# Patient Record
Sex: Female | Born: 1935 | Race: White | Smoking: Former smoker
Health system: Southern US, Community
[De-identification: ages and names within clinical notes are randomized; demographics above are authoritative.]

## PROBLEM LIST (undated history)

## (undated) DIAGNOSIS — C50919 Malignant neoplasm of unspecified site of unspecified female breast: Secondary | ICD-10-CM

## (undated) DIAGNOSIS — I2510 Atherosclerotic heart disease of native coronary artery without angina pectoris: Secondary | ICD-10-CM

## (undated) DIAGNOSIS — E119 Type 2 diabetes mellitus without complications: Secondary | ICD-10-CM

## (undated) DIAGNOSIS — I10 Essential (primary) hypertension: Secondary | ICD-10-CM

## (undated) HISTORY — PX: CHOLECYSTECTOMY: SHX55

## (undated) HISTORY — PX: OTHER SURGICAL HISTORY: SHX169

## (undated) HISTORY — PX: EYE SURGERY: SHX253

## (undated) HISTORY — PX: CARDIAC SURGERY: SHX584

## (undated) HISTORY — PX: BREAST LUMPECTOMY: SHX2

---

## 2010-12-03 ENCOUNTER — Ambulatory Visit (HOSPITAL_COMMUNITY)
Admission: RE | Admit: 2010-12-03 | Discharge: 2010-12-03 | Disposition: A | Discharge status: Home / Self Care | Payer: Medicare Other | Source: Ambulatory Visit | Attending: Family Medicine | Admitting: Family Medicine

## 2010-12-03 ENCOUNTER — Other Ambulatory Visit (HOSPITAL_COMMUNITY): Payer: Self-pay | Admitting: Family Medicine

## 2010-12-03 DIAGNOSIS — M545 Low back pain, unspecified: Secondary | ICD-10-CM

## 2010-12-03 DIAGNOSIS — M51379 Other intervertebral disc degeneration, lumbosacral region without mention of lumbar back pain or lower extremity pain: Secondary | ICD-10-CM | POA: Insufficient documentation

## 2010-12-03 DIAGNOSIS — M5137 Other intervertebral disc degeneration, lumbosacral region: Secondary | ICD-10-CM | POA: Insufficient documentation

## 2013-08-31 DIAGNOSIS — C50919 Malignant neoplasm of unspecified site of unspecified female breast: Secondary | ICD-10-CM

## 2013-08-31 HISTORY — DX: Malignant neoplasm of unspecified site of unspecified female breast: C50.919

## 2016-06-01 ENCOUNTER — Inpatient Hospital Stay (HOSPITAL_COMMUNITY)
Admission: EM | Admit: 2016-06-01 | Discharge: 2016-06-11 | DRG: 463 | Disposition: A | Discharge status: Skilled Nursing Facility | Payer: Medicare Other | Attending: Internal Medicine | Admitting: Internal Medicine

## 2016-06-01 ENCOUNTER — Emergency Department (HOSPITAL_COMMUNITY): Payer: Medicare Other

## 2016-06-01 ENCOUNTER — Encounter (HOSPITAL_COMMUNITY): Payer: Self-pay | Admitting: Emergency Medicine

## 2016-06-01 DIAGNOSIS — D500 Iron deficiency anemia secondary to blood loss (chronic): Secondary | ICD-10-CM | POA: Diagnosis present

## 2016-06-01 DIAGNOSIS — R8271 Bacteriuria: Secondary | ICD-10-CM | POA: Diagnosis not present

## 2016-06-01 DIAGNOSIS — T8454XA Infection and inflammatory reaction due to internal left knee prosthesis, initial encounter: Principal | ICD-10-CM | POA: Diagnosis present

## 2016-06-01 DIAGNOSIS — Z881 Allergy status to other antibiotic agents status: Secondary | ICD-10-CM

## 2016-06-01 DIAGNOSIS — Z7901 Long term (current) use of anticoagulants: Secondary | ICD-10-CM

## 2016-06-01 DIAGNOSIS — M009 Pyogenic arthritis, unspecified: Secondary | ICD-10-CM | POA: Diagnosis present

## 2016-06-01 DIAGNOSIS — M00062 Staphylococcal arthritis, left knee: Secondary | ICD-10-CM | POA: Diagnosis not present

## 2016-06-01 DIAGNOSIS — Z880 Allergy status to penicillin: Secondary | ICD-10-CM | POA: Diagnosis not present

## 2016-06-01 DIAGNOSIS — I429 Cardiomyopathy, unspecified: Secondary | ICD-10-CM | POA: Diagnosis present

## 2016-06-01 DIAGNOSIS — Z79899 Other long term (current) drug therapy: Secondary | ICD-10-CM | POA: Diagnosis not present

## 2016-06-01 DIAGNOSIS — N179 Acute kidney failure, unspecified: Secondary | ICD-10-CM | POA: Diagnosis present

## 2016-06-01 DIAGNOSIS — I10 Essential (primary) hypertension: Secondary | ICD-10-CM | POA: Diagnosis present

## 2016-06-01 DIAGNOSIS — Z09 Encounter for follow-up examination after completed treatment for conditions other than malignant neoplasm: Secondary | ICD-10-CM

## 2016-06-01 DIAGNOSIS — I340 Nonrheumatic mitral (valve) insufficiency: Secondary | ICD-10-CM | POA: Diagnosis not present

## 2016-06-01 DIAGNOSIS — I5043 Acute on chronic combined systolic (congestive) and diastolic (congestive) heart failure: Secondary | ICD-10-CM | POA: Diagnosis present

## 2016-06-01 DIAGNOSIS — T8454XD Infection and inflammatory reaction due to internal left knee prosthesis, subsequent encounter: Secondary | ICD-10-CM | POA: Diagnosis not present

## 2016-06-01 DIAGNOSIS — R52 Pain, unspecified: Secondary | ICD-10-CM | POA: Diagnosis present

## 2016-06-01 DIAGNOSIS — A419 Sepsis, unspecified organism: Secondary | ICD-10-CM | POA: Diagnosis present

## 2016-06-01 DIAGNOSIS — I2510 Atherosclerotic heart disease of native coronary artery without angina pectoris: Secondary | ICD-10-CM | POA: Diagnosis present

## 2016-06-01 DIAGNOSIS — R739 Hyperglycemia, unspecified: Secondary | ICD-10-CM

## 2016-06-01 DIAGNOSIS — N390 Urinary tract infection, site not specified: Secondary | ICD-10-CM | POA: Diagnosis present

## 2016-06-01 DIAGNOSIS — Z8249 Family history of ischemic heart disease and other diseases of the circulatory system: Secondary | ICD-10-CM

## 2016-06-01 DIAGNOSIS — R262 Difficulty in walking, not elsewhere classified: Secondary | ICD-10-CM

## 2016-06-01 DIAGNOSIS — N61 Mastitis without abscess: Secondary | ICD-10-CM | POA: Diagnosis present

## 2016-06-01 DIAGNOSIS — E872 Acidosis, unspecified: Secondary | ICD-10-CM | POA: Diagnosis present

## 2016-06-01 DIAGNOSIS — I081 Rheumatic disorders of both mitral and tricuspid valves: Secondary | ICD-10-CM | POA: Diagnosis present

## 2016-06-01 DIAGNOSIS — E1165 Type 2 diabetes mellitus with hyperglycemia: Secondary | ICD-10-CM | POA: Diagnosis present

## 2016-06-01 DIAGNOSIS — Z7984 Long term (current) use of oral hypoglycemic drugs: Secondary | ICD-10-CM | POA: Diagnosis not present

## 2016-06-01 DIAGNOSIS — A491 Streptococcal infection, unspecified site: Secondary | ICD-10-CM | POA: Diagnosis not present

## 2016-06-01 DIAGNOSIS — T8450XD Infection and inflammatory reaction due to unspecified internal joint prosthesis, subsequent encounter: Secondary | ICD-10-CM

## 2016-06-01 DIAGNOSIS — J811 Chronic pulmonary edema: Secondary | ICD-10-CM

## 2016-06-01 DIAGNOSIS — I110 Hypertensive heart disease with heart failure: Secondary | ICD-10-CM | POA: Diagnosis present

## 2016-06-01 DIAGNOSIS — E876 Hypokalemia: Secondary | ICD-10-CM | POA: Diagnosis present

## 2016-06-01 DIAGNOSIS — Z853 Personal history of malignant neoplasm of breast: Secondary | ICD-10-CM

## 2016-06-01 DIAGNOSIS — M00262 Other streptococcal arthritis, left knee: Secondary | ICD-10-CM | POA: Diagnosis not present

## 2016-06-01 DIAGNOSIS — R7881 Bacteremia: Secondary | ICD-10-CM | POA: Diagnosis not present

## 2016-06-01 DIAGNOSIS — I480 Paroxysmal atrial fibrillation: Secondary | ICD-10-CM | POA: Diagnosis present

## 2016-06-01 DIAGNOSIS — Z825 Family history of asthma and other chronic lower respiratory diseases: Secondary | ICD-10-CM | POA: Diagnosis not present

## 2016-06-01 DIAGNOSIS — B951 Streptococcus, group B, as the cause of diseases classified elsewhere: Secondary | ICD-10-CM | POA: Diagnosis not present

## 2016-06-01 DIAGNOSIS — Z9111 Patient's noncompliance with dietary regimen: Secondary | ICD-10-CM

## 2016-06-01 DIAGNOSIS — R062 Wheezing: Secondary | ICD-10-CM

## 2016-06-01 DIAGNOSIS — W1830XA Fall on same level, unspecified, initial encounter: Secondary | ICD-10-CM | POA: Diagnosis present

## 2016-06-01 DIAGNOSIS — A401 Sepsis due to streptococcus, group B: Secondary | ICD-10-CM

## 2016-06-01 DIAGNOSIS — Y831 Surgical operation with implant of artificial internal device as the cause of abnormal reaction of the patient, or of later complication, without mention of misadventure at the time of the procedure: Secondary | ICD-10-CM | POA: Diagnosis present

## 2016-06-01 DIAGNOSIS — IMO0002 Reserved for concepts with insufficient information to code with codable children: Secondary | ICD-10-CM | POA: Diagnosis present

## 2016-06-01 DIAGNOSIS — R000 Tachycardia, unspecified: Secondary | ICD-10-CM | POA: Diagnosis present

## 2016-06-01 DIAGNOSIS — Z923 Personal history of irradiation: Secondary | ICD-10-CM

## 2016-06-01 DIAGNOSIS — T8459XA Infection and inflammatory reaction due to other internal joint prosthesis, initial encounter: Secondary | ICD-10-CM

## 2016-06-01 DIAGNOSIS — Z22330 Carrier of Group B streptococcus: Secondary | ICD-10-CM

## 2016-06-01 DIAGNOSIS — I4510 Unspecified right bundle-branch block: Secondary | ICD-10-CM | POA: Diagnosis present

## 2016-06-01 DIAGNOSIS — I330 Acute and subacute infective endocarditis: Secondary | ICD-10-CM | POA: Diagnosis not present

## 2016-06-01 DIAGNOSIS — Z87891 Personal history of nicotine dependence: Secondary | ICD-10-CM

## 2016-06-01 DIAGNOSIS — Y792 Prosthetic and other implants, materials and accessory orthopedic devices associated with adverse incidents: Secondary | ICD-10-CM | POA: Diagnosis not present

## 2016-06-01 DIAGNOSIS — Z79811 Long term (current) use of aromatase inhibitors: Secondary | ICD-10-CM

## 2016-06-01 DIAGNOSIS — M25562 Pain in left knee: Secondary | ICD-10-CM

## 2016-06-01 DIAGNOSIS — Z96659 Presence of unspecified artificial knee joint: Secondary | ICD-10-CM

## 2016-06-01 HISTORY — DX: Malignant neoplasm of unspecified site of unspecified female breast: C50.919

## 2016-06-01 HISTORY — DX: Atherosclerotic heart disease of native coronary artery without angina pectoris: I25.10

## 2016-06-01 HISTORY — DX: Essential (primary) hypertension: I10

## 2016-06-01 HISTORY — DX: Type 2 diabetes mellitus without complications: E11.9

## 2016-06-01 LAB — CBC WITH DIFFERENTIAL/PLATELET
BASOS PCT: 0 %
BASOS PCT: 0 %
Basophils Absolute: 0 10*3/uL (ref 0.0–0.1)
Basophils Absolute: 0 10*3/uL (ref 0.0–0.1)
EOS ABS: 0 10*3/uL (ref 0.0–0.7)
Eosinophils Absolute: 0 10*3/uL (ref 0.0–0.7)
Eosinophils Relative: 0 %
Eosinophils Relative: 0 %
HEMATOCRIT: 41.1 % (ref 36.0–46.0)
HEMATOCRIT: 37 % (ref 36.0–46.0)
HEMOGLOBIN: 14.2 g/dL (ref 12.0–15.0)
HEMOGLOBIN: 12.7 g/dL (ref 12.0–15.0)
LYMPHS ABS: 1.1 10*3/uL (ref 0.7–4.0)
LYMPHS PCT: 10 %
Lymphocytes Relative: 13 %
Lymphs Abs: 1.1 10*3/uL (ref 0.7–4.0)
MCH: 29.5 pg (ref 26.0–34.0)
MCH: 29.1 pg (ref 26.0–34.0)
MCHC: 34.5 g/dL (ref 30.0–36.0)
MCHC: 34.3 g/dL (ref 30.0–36.0)
MCV: 85.3 fL (ref 78.0–100.0)
MCV: 84.7 fL (ref 78.0–100.0)
MONO ABS: 1.4 10*3/uL — AB (ref 0.1–1.0)
MONOS PCT: 12 %
MONOS PCT: 11 %
Monocytes Absolute: 1 10*3/uL (ref 0.1–1.0)
NEUTROS ABS: 9 10*3/uL — AB (ref 1.7–7.7)
NEUTROS ABS: 6.8 10*3/uL (ref 1.7–7.7)
NEUTROS PCT: 78 %
NEUTROS PCT: 76 %
Platelets: 115 10*3/uL — ABNORMAL LOW (ref 150–400)
Platelets: 121 10*3/uL — ABNORMAL LOW (ref 150–400)
RBC: 4.82 MIL/uL (ref 3.87–5.11)
RBC: 4.37 MIL/uL (ref 3.87–5.11)
RDW: 15.1 % (ref 11.5–15.5)
RDW: 15.1 % (ref 11.5–15.5)
WBC: 11.5 10*3/uL — ABNORMAL HIGH (ref 4.0–10.5)
WBC: 8.9 10*3/uL (ref 4.0–10.5)

## 2016-06-01 LAB — BASIC METABOLIC PANEL
ANION GAP: 13 (ref 5–15)
BUN: 25 mg/dL — ABNORMAL HIGH (ref 6–20)
CALCIUM: 8.8 mg/dL — AB (ref 8.9–10.3)
CHLORIDE: 90 mmol/L — AB (ref 101–111)
CO2: 21 mmol/L — AB (ref 22–32)
Creatinine, Ser: 1.35 mg/dL — ABNORMAL HIGH (ref 0.44–1.00)
GFR calc non Af Amer: 37 mL/min — ABNORMAL LOW (ref >60)
GFR, EST AFRICAN AMERICAN: 42 mL/min — AB (ref >60)
GLUCOSE: 559 mg/dL — AB (ref 65–99)
POTASSIUM: 4.1 mmol/L (ref 3.5–5.1)
Sodium: 124 mmol/L — ABNORMAL LOW (ref 135–145)

## 2016-06-01 LAB — APTT: aPTT: 59 seconds — ABNORMAL HIGH (ref 24–36)

## 2016-06-01 LAB — URINALYSIS, ROUTINE W REFLEX MICROSCOPIC
BILIRUBIN URINE: NEGATIVE
Ketones, ur: NEGATIVE mg/dL
Nitrite: NEGATIVE
PH: 5.5 (ref 5.0–8.0)
Protein, ur: 30 mg/dL — AB
SPECIFIC GRAVITY, URINE: 1.025 (ref 1.005–1.030)

## 2016-06-01 LAB — LACTIC ACID, PLASMA: Lactic Acid, Venous: 3.9 mmol/L (ref 0.5–1.9)

## 2016-06-01 LAB — I-STAT CG4 LACTIC ACID, ED: Lactic Acid, Venous: 5.41 mmol/L (ref 0.5–1.9)

## 2016-06-01 LAB — PROTIME-INR
INR: 3.15
Prothrombin Time: 33.1 seconds — ABNORMAL HIGH (ref 11.4–15.2)

## 2016-06-01 LAB — COMPREHENSIVE METABOLIC PANEL
ALBUMIN: 2.7 g/dL — AB (ref 3.5–5.0)
ALK PHOS: 63 U/L (ref 38–126)
ALT: 21 U/L (ref 14–54)
AST: 28 U/L (ref 15–41)
Anion gap: 7 (ref 5–15)
BILIRUBIN TOTAL: 1.1 mg/dL (ref 0.3–1.2)
BUN: 26 mg/dL — AB (ref 6–20)
CALCIUM: 8.1 mg/dL — AB (ref 8.9–10.3)
CO2: 24 mmol/L (ref 22–32)
CREATININE: 0.95 mg/dL (ref 0.44–1.00)
Chloride: 101 mmol/L (ref 101–111)
GFR calc Af Amer: >60 mL/min (ref >60)
GFR calc non Af Amer: 56 mL/min — ABNORMAL LOW (ref >60)
GLUCOSE: 169 mg/dL — AB (ref 65–99)
Potassium: 3.5 mmol/L (ref 3.5–5.1)
SODIUM: 132 mmol/L — AB (ref 135–145)
TOTAL PROTEIN: 5.4 g/dL — AB (ref 6.5–8.1)

## 2016-06-01 LAB — CBG MONITORING, ED
GLUCOSE-CAPILLARY: 595 mg/dL — AB (ref 65–99)
GLUCOSE-CAPILLARY: 233 mg/dL — AB (ref 65–99)
GLUCOSE-CAPILLARY: 157 mg/dL — AB (ref 65–99)
Glucose-Capillary: >600 mg/dL (ref 65–99)
Glucose-Capillary: 464 mg/dL — ABNORMAL HIGH (ref 65–99)
Glucose-Capillary: 407 mg/dL — ABNORMAL HIGH (ref 65–99)
Glucose-Capillary: 350 mg/dL — ABNORMAL HIGH (ref 65–99)
Glucose-Capillary: 283 mg/dL — ABNORMAL HIGH (ref 65–99)

## 2016-06-01 LAB — GRAM STAIN

## 2016-06-01 LAB — MAGNESIUM: Magnesium: 1.9 mg/dL (ref 1.7–2.4)

## 2016-06-01 LAB — SYNOVIAL CELL COUNT + DIFF, W/ CRYSTALS
EOSINOPHILS-SYNOVIAL: 1 % (ref 0–1)
Lymphocytes-Synovial Fld: 5 % (ref 0–20)
Monocyte-Macrophage-Synovial Fluid: 5 % — ABNORMAL LOW (ref 50–90)
Neutrophil, Synovial: 90 % — ABNORMAL HIGH (ref 0–25)
OTHER CELLS-SYN: 0
WBC, SYNOVIAL: 98000 /mm3 — AB (ref 0–200)

## 2016-06-01 LAB — URINE MICROSCOPIC-ADD ON

## 2016-06-01 LAB — PROCALCITONIN: Procalcitonin: 47.44 ng/mL

## 2016-06-01 LAB — CK: Total CK: 457 U/L — ABNORMAL HIGH (ref 38–234)

## 2016-06-01 LAB — PHOSPHORUS: PHOSPHORUS: 1.9 mg/dL — AB (ref 2.5–4.6)

## 2016-06-01 LAB — BETA-HYDROXYBUTYRIC ACID: BETA-HYDROXYBUTYRIC ACID: 1.05 mmol/L — AB (ref 0.05–0.27)

## 2016-06-01 MED ORDER — LIDOCAINE-EPINEPHRINE (PF) 1 %-1:200000 IJ SOLN
INTRAMUSCULAR | Status: AC
  Administered 2016-06-01: 18:00:00
  Filled 2016-06-01: qty 30

## 2016-06-01 MED ORDER — VANCOMYCIN HCL 10 G IV SOLR
1500.0000 mg | Freq: Once | INTRAVENOUS | Status: AC
  Administered 2016-06-01: 1500 mg via INTRAVENOUS
  Filled 2016-06-01: qty 1500

## 2016-06-01 MED ORDER — SODIUM CHLORIDE 0.9 % IV SOLN: INTRAVENOUS | Status: DC

## 2016-06-01 MED ORDER — ZOLPIDEM TARTRATE 5 MG PO TABS
5.0000 mg | ORAL_TABLET | Freq: Every evening | ORAL | Status: DC | PRN
  Administered 2016-06-07 – 2016-06-10 (×4): 5 mg via ORAL
  Filled 2016-06-01 (×5): qty 1

## 2016-06-01 MED ORDER — KETOROLAC TROMETHAMINE 15 MG/ML IJ SOLN
15.0000 mg | Freq: Four times a day (QID) | INTRAMUSCULAR | Status: AC | PRN
  Administered 2016-06-02: 15 mg via INTRAVENOUS
  Filled 2016-06-01: qty 1

## 2016-06-01 MED ORDER — SODIUM CHLORIDE 0.9 % IV BOLUS (SEPSIS)
1000.0000 mL | Freq: Once | INTRAVENOUS | Status: AC
  Administered 2016-06-01: 1000 mL via INTRAVENOUS

## 2016-06-01 MED ORDER — DEXTROSE-NACL 5-0.45 % IV SOLN
INTRAVENOUS | Status: DC
  Administered 2016-06-01: 1000 mL via INTRAVENOUS

## 2016-06-01 MED ORDER — DEXTROSE 5 % IV SOLN
2.0000 g | Freq: Once | INTRAVENOUS | Status: AC
  Administered 2016-06-01: 2 g via INTRAVENOUS
  Filled 2016-06-01: qty 2

## 2016-06-01 MED ORDER — DEXTROSE 50 % IV SOLN: 25.0000 mL | INTRAVENOUS | Status: DC | PRN

## 2016-06-01 MED ORDER — K PHOS MONO-SOD PHOS DI & MONO 155-852-130 MG PO TABS
500.0000 mg | ORAL_TABLET | Freq: Three times a day (TID) | ORAL | Status: AC
Start: 2016-06-01 — End: 2016-06-02
  Administered 2016-06-02 (×3): 500 mg via ORAL
  Filled 2016-06-01 (×3): qty 2

## 2016-06-01 MED ORDER — POVIDONE-IODINE 10 % EX SOLN
CUTANEOUS | Status: AC
  Administered 2016-06-01: 18:00:00
  Filled 2016-06-01: qty 118

## 2016-06-01 MED ORDER — SODIUM CHLORIDE 0.9 % IV SOLN
1250.0000 mg | INTRAVENOUS | Status: DC
  Administered 2016-06-02: 1250 mg via INTRAVENOUS
  Filled 2016-06-01 (×2): qty 1250

## 2016-06-01 MED ORDER — SODIUM CHLORIDE 0.9 % IV SOLN
INTRAVENOUS | Status: DC
  Filled 2016-06-01: qty 2.5

## 2016-06-01 MED ORDER — SODIUM CHLORIDE 0.9 % IV SOLN
INTRAVENOUS | Status: DC
  Administered 2016-06-01: 16:00:00 via INTRAVENOUS
  Filled 2016-06-01: qty 2.5

## 2016-06-01 MED ORDER — DEXTROSE 5 % IV SOLN
2.0000 g | Freq: Three times a day (TID) | INTRAVENOUS | Status: DC
  Administered 2016-06-02 (×2): 2 g via INTRAVENOUS
  Filled 2016-06-01 (×4): qty 2

## 2016-06-01 MED ORDER — SODIUM CHLORIDE 0.9 % IV SOLN
INTRAVENOUS | Status: DC
  Administered 2016-06-01: 1000 mL via INTRAVENOUS

## 2016-06-01 MED ORDER — DEXTROSE-NACL 5-0.45 % IV SOLN: INTRAVENOUS | Status: DC

## 2016-06-01 MED ORDER — OXYBUTYNIN CHLORIDE ER 10 MG PO TB24
10.0000 mg | ORAL_TABLET | Freq: Every day | ORAL | Status: DC
  Administered 2016-06-02 – 2016-06-10 (×9): 10 mg via ORAL
  Filled 2016-06-01 (×11): qty 1

## 2016-06-01 MED ORDER — SODIUM CHLORIDE 0.9% FLUSH
3.0000 mL | Freq: Two times a day (BID) | INTRAVENOUS | Status: DC
  Administered 2016-06-02 – 2016-06-07 (×6): 3 mL via INTRAVENOUS

## 2016-06-01 MED ORDER — VANCOMYCIN HCL IN DEXTROSE 1-5 GM/200ML-% IV SOLN
1000.0000 mg | Freq: Once | INTRAVENOUS | Status: DC
  Filled 2016-06-01: qty 200

## 2016-06-01 MED ORDER — ANASTROZOLE 1 MG PO TABS
1.0000 mg | ORAL_TABLET | Freq: Every day | ORAL | Status: DC
  Administered 2016-06-02 – 2016-06-11 (×8): 1 mg via ORAL
  Filled 2016-06-01 (×11): qty 1

## 2016-06-01 MED ORDER — PANTOPRAZOLE SODIUM 40 MG PO TBEC
40.0000 mg | DELAYED_RELEASE_TABLET | Freq: Every day | ORAL | Status: DC
  Administered 2016-06-02 – 2016-06-11 (×8): 40 mg via ORAL
  Filled 2016-06-01 (×9): qty 1

## 2016-06-01 MED ORDER — HEPARIN SODIUM (PORCINE) 5000 UNIT/ML IJ SOLN: 5000.0000 [IU] | Freq: Three times a day (TID) | INTRAMUSCULAR | Status: DC

## 2016-06-01 MED ORDER — DRONEDARONE HCL 400 MG PO TABS
400.0000 mg | ORAL_TABLET | Freq: Two times a day (BID) | ORAL | Status: DC
  Administered 2016-06-02 – 2016-06-11 (×17): 400 mg via ORAL
  Filled 2016-06-01 (×20): qty 1

## 2016-06-01 MED ORDER — SODIUM CHLORIDE 0.9 % IV SOLN
INTRAVENOUS | Status: AC
  Filled 2016-06-01: qty 2.5

## 2016-06-01 NOTE — ED Notes (Signed)
STILL NEED URINE, urine was accidentally received earlier but unable to obtain urine at this time. Pt has attempted bedpan. In/out attempted but pt refused due to too much pain, pt has rash to labia and is painful.

## 2016-06-01 NOTE — ED Notes (Signed)
Per glucomander, 8.7units/hr

## 2016-06-01 NOTE — ED Notes (Signed)
CRITICAL VALUE ALERT  Critical value received:  Left knee gram stain, abundant WBC polys, rare intracellular gram pos cocci  Date of notification:  06/01/16  Time of notification:  1804  Critical value read back:Yes.    Nurse who received alert:  c Berton Butrick rn  MD notified (1st page):    Time of first page:    MD notified (2nd page):  Time of second page:  Responding MD:  Dr. Sabra Heck  Time MD responded:  [phone]

## 2016-06-01 NOTE — ED Notes (Signed)
Per glucomander, insulin drip to 4.0.

## 2016-06-01 NOTE — ED Provider Notes (Signed)
With a spinalno back pain. Again  [facility]PT Provider Note   CSN: [id number] Arrival date & time: 06/01/16  1501     History   Chief Complaint Chief Complaint  Patient presents with  . Knee Pain  . Hyperglycemia    HPI Toni Hanna is a 80 y.o. female.  The patient is a 80 year old female, she has a prior history of a left total knee arthroplasty which was done several years ago. She states that over the last several days she has been having progressive pain and swelling in her left knee to the point where she was unable to walk last night and fell to the ground albeit very slowly. She reports that she laid on the ground all night long and could not get up secondary to pain. She was finally able to crawl to her phone and the other room this morning to make a phone call. She spent approximately 12 hours on the ground. The patient states thaer knee pain continues to get worse, she is a type II diabetic, she has not had her medications overnight but does take her oral medications regularly. She has not checked her blood sugar in some time. He symptoms are persistent, gradually worsening, she denies head injury, fevers or chills.  Went to PCP this AM - Dr. Karie Kirks who sent to ED for eval due to hypotension, hyperglycemia and swollen knee.   The history is provided by the patient.  Knee Pain    Hyperglycemia    Past Medical History:  Diagnosis Date  . Coronary artery disease   . Diabetes mellitus without complication ([place])   . Hypertension     Patient Active Problem List   Diagnosis Date Noted  . Septic arthritis ([place]) 06/01/2016    Past Surgical History:  Procedure Laterality Date  . CARDIAC SURGERY    . cataract surgery    . EYE SURGERY      OB History    Gravida Para Term Preterm AB Living   2 2 2          SAB TAB Ectopic Multiple Live Births                   Home Medications    Prior to Admission medications   Medication Sig Start Date End Date  Taking? Authorizing Provider  anastrozole (ARIMIDEX) 1 MG tablet Take 1 mg by mouth daily.   Yes Historical Provider, MD  dronedarone (MULTAQ) 400 MG tablet Take 400 mg by mouth 2 (two) times daily with a meal.   Yes Historical Provider, MD  furosemide (LASIX) 40 MG tablet Take 40 mg by mouth daily.   Yes Historical Provider, MD  glipiZIDE (GLUCOTROL XL) 10 MG 24 hr tablet Take 10 mg by mouth 2 (two) times daily.   Yes Historical Provider, MD  metFORMIN (GLUCOPHAGE-XR) 500 MG 24 hr tablet Take 500 mg by mouth 2 (two) times daily.   Yes Historical Provider, MD  metoprolol succinate (TOPROL-XL) 25 MG 24 hr tablet Take 25 mg by mouth 2 (two) times daily.   Yes Historical Provider, MD  Multiple Vitamins-Minerals (PRESERVISION AREDS 2+MULTI VIT PO) Take 2 tablets by mouth daily.   Yes Historical Provider, MD  tolterodine (DETROL) 2 MG tablet Take 2-4 mg by mouth 2 (two) times daily. Two tablets in the morning and one tablet in the evening   Yes Historical Provider, MD  zaleplon (SONATA) 5 MG capsule Take 5 mg by mouth at bedtime as needed  for sleep.   Yes Historical Provider, MD    Family History Family History  Problem Relation Age of Onset  . Heart failure Mother   . Asthma Father     Social History Social History  Substance Use Topics  . Smoking status: Former Research scientist (life sciences)  . Smokeless tobacco: Never Used  . Alcohol use No     Allergies   Amoxicillin and Penicillins   Review of Systems Review of Systems  All other systems reviewed and are negative.    Physical Exam Updated Vital Signs BP 98/57   Pulse 90   Temp 98.2 F (36.8 C) (Oral)   Resp (!) 27   Ht 5\' 3"  (1.6 m)   Wt 182 lb (82.6 kg)   SpO2 95%   BMI 32.24 kg/m   Physical Exam  Constitutional: She appears well-developed and well-nourished. No distress.  HENT:  Head: Normocephalic and atraumatic.  Mouth/Throat: Oropharynx is clear and moist. No oropharyngeal exudate.  Mucous membranes are significantly dehydrated    Eyes: Conjunctivae and EOM are normal. Pupils are equal, round, and reactive to light. Right eye exhibits no discharge. Left eye exhibits no discharge. No scleral icterus.  Neck: Normal range of motion. Neck supple. No JVD present. No thyromegaly present.  Cardiovascular: Normal rate, regular rhythm, normal heart sounds and intact distal pulses.  Exam reveals no gallop and no friction rub.   No murmur heard. Pulmonary/Chest: Effort normal and breath sounds normal. No respiratory distress. She has no wheezes. She has no rales.  The right breast has significant erythema and warmth but no induration  Abdominal: Soft. Bowel sounds are normal. She exhibits no distension and no mass. There is no tenderness.  Musculoskeletal: She exhibits tenderness. She exhibits no edema.  Joints are supple, compartment are soft, the abnormal joint as the left knee which has an effusion and significant decreased range of motion secondary to pain. The patient is unable to bear weight on that knee.  Lymphadenopathy:    She has no cervical adenopathy.  Neurological: She is alert. Coordination normal.  Skin: Skin is warm and dry. No rash noted. There is erythema ( To the right breast).  Psychiatric: She has a normal mood and affect. Her behavior is normal.  Nursing note and vitals reviewed.    ED Treatments / Results  Labs (all labs ordered are listed, but only abnormal results are displayed) Labs Reviewed  BASIC METABOLIC PANEL - Abnormal; Notable for the following:       Result Value   Sodium 124 (*)    Chloride 90 (*)    CO2 21 (*)    Glucose, Bld 559 (*)    BUN 25 (*)    Creatinine, Ser 1.35 (*)    Calcium 8.8 (*)    GFR calc non Af Amer 37 (*)    GFR calc Af Amer 42 (*)    All other components within normal limits  CBC WITH DIFFERENTIAL/PLATELET - Abnormal; Notable for the following:    WBC 11.5 (*)    Platelets 115 (*)    Neutro Abs 9.0 (*)    Monocytes Absolute 1.4 (*)    All other components  within normal limits  BETA-HYDROXYBUTYRIC ACID - Abnormal; Notable for the following:    Beta-Hydroxybutyric Acid 1.05 (*)    All other components within normal limits  SYNOVIAL CELL COUNT + DIFF, W/ CRYSTALS - Abnormal; Notable for the following:    Color, Synovial BROWN (*)    Appearance-Synovial TURBID (*)  WBC, Synovial 98,000 (*)    Neutrophil, Synovial 90 (*)    Monocyte-Macrophage-Synovial Fluid 5 (*)    All other components within normal limits  CK - Abnormal; Notable for the following:    Total CK 457 (*)    All other components within normal limits  CBG MONITORING, ED - Abnormal; Notable for the following:    Glucose-Capillary >600 (*)    All other components within normal limits  CBG MONITORING, ED - Abnormal; Notable for the following:    Glucose-Capillary 595 (*)    All other components within normal limits  CBG MONITORING, ED - Abnormal; Notable for the following:    Glucose-Capillary 464 (*)    All other components within normal limits  CBG MONITORING, ED - Abnormal; Notable for the following:    Glucose-Capillary 407 (*)    All other components within normal limits  CBG MONITORING, ED - Abnormal; Notable for the following:    Glucose-Capillary 350 (*)    All other components within normal limits  GRAM STAIN  URINE CULTURE  CULTURE, BODY FLUID-BOTTLE  CULTURE, BLOOD (ROUTINE X 2)  CULTURE, BLOOD (ROUTINE X 2)  URINALYSIS, ROUTINE W REFLEX MICROSCOPIC (NOT AT [facility])  I-STAT CG4 LACTIC ACID, ED    EKG  EKG Interpretation  Date/Time:  Monday June 01 2016 15:15:58 EDT Ventricular Rate:  86 PR Interval:  222 QRS Duration: 128 QT Interval:  408 QTC Calculation: 488 R Axis:   -82 Text Interpretation:  Right bundle branch block Left axis deviation Non-specific intra-ventricular conduction block Inferior infarct , age undetermined Anterolateral infarct , age undetermined Abnormal ECG No old tracing to compare Confirmed by Michaell Grider  MD, [place] ([id number]) on  06/01/2016 4:20:45 PM       Radiology Dg Knee Left Port  Result Date: 06/01/2016 CLINICAL DATA:  Pain and swelling since Saturday.  No known injury. EXAM: PORTABLE LEFT KNEE - 1-2 VIEW COMPARISON:  None. FINDINGS: The left knee demonstrates a total knee arthroplasty without evidence of hardware failure complication. There is a small joint effusion. There is no fracture or dislocation. The alignment is anatomic. IMPRESSION: Left total knee arthroplasty without failure or complication. Electronically Signed   By: Kathreen Devoid   On: 06/01/2016 16:22    Procedures .Joint Aspiration/Arthrocentesis Date/Time: 06/01/2016 4:51 PM Performed by: Noemi Chapel Authorized by: Noemi Chapel   Consent:    Consent obtained:  Verbal and written   Consent given by:  Patient   Risks discussed:  Bleeding, infection, pain, incomplete drainage and nerve damage   Alternatives discussed:  No treatment Location:    Location:  Knee   Knee:  L knee Anesthesia (see MAR for exact dosages):    Anesthesia method:  Local infiltration   Local anesthetic:  Lidocaine 1% WITH epi Procedure details:    Preparation: Patient was prepped and draped in usual sterile fashion     Needle gauge:  18 G   Ultrasound guidance: no     Approach:  Lateral   Aspirate amount:  50   Aspirate characteristics:  Purulent   Steroid injected: no     Specimen collected: yes   Post-procedure details:    Dressing:  Sterile dressing   Patient tolerance of procedure:  Tolerated well, no immediate complications Comments:     Sent for gram stain / counts and cultures and crystals   (including critical care time)  Medications Ordered in ED Medications  insulin regular (NOVOLIN R,HUMULIN R) 250 Units in sodium chloride 0.9 %  250 mL (1 Units/mL) infusion ( Intravenous New Bag/Given 06/01/16 1602)  0.9 %  sodium chloride infusion (not administered)  dextrose 5 %-0.45 % sodium chloride infusion (not administered)  aztreonam (AZACTAM) 2 g  in dextrose 5 % 50 mL IVPB (not administered)  vancomycin (VANCOCIN) 1,500 mg in sodium chloride 0.9 % 500 mL IVPB (not administered)  aztreonam (AZACTAM) 2 g in dextrose 5 % 50 mL IVPB (not administered)  vancomycin (VANCOCIN) 1,250 mg in sodium chloride 0.9 % 250 mL IVPB (not administered)  sodium chloride 0.9 % bolus 1,000 mL (not administered)  sodium chloride 0.9 % bolus 1,000 mL (0 mLs Intravenous Stopped 06/01/16 1752)  sodium chloride 0.9 % bolus 1,000 mL (0 mLs Intravenous Stopped 06/01/16 1908)  povidone-iodine (BETADINE) 10 % external solution (  Given by Other 06/01/16 1752)  lidocaine-EPINEPHrine (XYLOCAINE-EPINEPHrine) 1 %-1:200000 (PF) injection (  Given by Other 06/01/16 1753)     Initial Impression / Assessment and Plan / ED Course  I have reviewed the triage vital signs and the nursing notes.  Pertinent labs & imaging results that were available during my care of the patient were reviewed by me and considered in my medical decision making (see chart for details).  Clinical Course    Consider Blood, infection, crystals Severe hyperglycemia with hypotension - r/o DKA Fluid rescucitation Insulin after K checked Pt is critically ill D/w Dr. Olevia Bowens - will orchestrate transfer to [place] Performed by: Johnna Acosta Total critical care time: 35 minutes Critical care time was exclusive of separately billable procedures and treating other patients. Critical care was necessary to treat or prevent imminent or life-threatening deterioration. Critical care was time spent personally by me on the following activities: development of treatment plan with patient and/or surrogate as well as nursing, discussions with consultants, evaluation of patient's response to treatment, examination of patient, obtaining history from patient or surrogate, ordering and performing treatments and interventions, ordering and review of laboratory studies, ordering and review of radiographic  studies, pulse oximetry and re-evaluation of patient's condition.  D/w Dr. Aline Brochure - states can't do definitive operation D/w Dr. Rex Kras in [place] - agreeable to consult when medically stable Will d/w hosptialist for transfer Code sepsis activated for likley sepsis from joint / breast cellulitis - though difficulty to know that it is not from hyperglycenia and dehydration given clinical appearance and severe hyperglycemia.  Sepsis - Repeat Assessment  Performed at:    6:52   Vitals     Blood pressure (!) 97/43, pulse 90, temperature 98.2 F (36.8 C), temperature source Oral, resp. rate 20, height 5\' 3"  (1.6 m), weight 182 lb (82.6 kg), SpO2 95 %.  Heart:     Regular rate and rhythm  Lungs:    CTA  Capillary Refill:   <2 sec  Peripheral Pulse:   Radial pulse palpable  Skin:     Normal Color other than breast   Medications  insulin regular (NOVOLIN R,HUMULIN R) 250 Units in sodium chloride 0.9 % 250 mL (1 Units/mL) infusion ( Intravenous New Bag/Given 06/01/16 1602)  0.9 %  sodium chloride infusion (not administered)  dextrose 5 %-0.45 % sodium chloride infusion (not administered)  aztreonam (AZACTAM) 2 g in dextrose 5 % 50 mL IVPB (not administered)  vancomycin (VANCOCIN) 1,500 mg in sodium chloride 0.9 % 500 mL IVPB (not administered)  aztreonam (AZACTAM) 2 g in dextrose 5 % 50 mL IVPB (not administered)  vancomycin (VANCOCIN) 1,250 mg in sodium chloride 0.9 %  250 mL IVPB (not administered)  sodium chloride 0.9 % bolus 1,000 mL (not administered)  sodium chloride 0.9 % bolus 1,000 mL (0 mLs Intravenous Stopped 06/01/16 1752)  sodium chloride 0.9 % bolus 1,000 mL (0 mLs Intravenous Stopped 06/01/16 1908)  povidone-iodine (BETADINE) 10 % external solution (  Given by Other 06/01/16 1752)  lidocaine-EPINEPHrine (XYLOCAINE-EPINEPHrine) 1 %-1:200000 (PF) injection (  Given by Other 06/01/16 1753)     Final Clinical Impressions(s) / ED Diagnoses   Final diagnoses:  Pyogenic  arthritis of left knee joint, due to unspecified organism ([place])  Sepsis, due to unspecified organism [facility])  Cellulitis of breast  Hyperglycemia    New Prescriptions New Prescriptions   No medications on file     Noemi Chapel, MD 06/01/16 1940

## 2016-06-01 NOTE — ED Triage Notes (Signed)
Pt reports L knee pain that started on Fri, increasing pain through the weekend. Pt fell on floor on Sunday because her knee gave way. Pt unable to get herself up. Pt spent the night on the floor. Pt  Was picked up from the floor today at approx 1000. Pt denies LOC or hitting her head when she fell.

## 2016-06-01 NOTE — ED Notes (Signed)
CRITICAL VALUE ALERT  Critical value received:  Lactic acid 3.9  Date of notification:  06/01/2016  Time of notification:  2343  Critical value read back:Yes.    Nurse who received alert:  Fabio Neighbors RN  MD notified (1st page):  Dr Myna Hidalgo  Time of first page:  2346  MD notified (2nd page):  Time of second page:  Responding MD:    Time MD responded:

## 2016-06-01 NOTE — H&P (Addendum)
History and Physical    Toni Hanna [id number] DOB: [birth year] DOA: 06/01/2016  PCP: Robert Bellow, MD   Patient coming from: Home.  Chief Complaint: Left knee pain.  HPI: Toni Hanna is a 80 y.o. female with medical history significant of breast cancer, history of right breast lumpectomy, CAD, type 2 diabetes, hypertension who is coming to the emergency department due to left knee pain.  Per patient, she started having left knee pain on Friday which continued to get worse through the weekend. She had a left knee arthroplasty in [place] about 10 years ago. She states that she fell on the floor on Sunday evening due to weakness and was unable to get up from the floor, so she decided to go to sleep. This morning, when she woke up, she crawled to the other room to reach her phone, made a phone call for help and was subsequently brought to the emergency department. She estimates that she spent about 12 hours on the floor. She denies fever, but complains of chills, fatigue, generalized weakness, decreased appetite.  On exam, the patient had right breast calor, erythema without significant tenderness, which she just noticed while being examined by Dr. Sabra Heck.  ED Course: Workup in the emergency department shows leukocytosis of 11.5, blood glucose of 559 mg/dL, elevated lactic acid and synovial fluid consistent with septic arthritis. Dr. Sabra Heck from the emergency department and spoke to Dr. Delfino Lovett (orthopedic surgery) who suggested transfer to [facility] for further evaluation and treatment.  Review of Systems: As per HPI otherwise 10 point review of systems negative.    Past Medical History:  Diagnosis Date  . Breast cancer ([place]) 2015  . Coronary artery disease   . Diabetes mellitus without complication ([place])   . Hypertension     Past Surgical History:  Procedure Laterality Date  . BREAST LUMPECTOMY Right   . CARDIAC SURGERY    . cataract surgery    . CHOLECYSTECTOMY      . EYE SURGERY       reports that she has quit smoking. She has never used smokeless tobacco. She reports that she does not drink alcohol or use drugs.  Allergies  Allergen Reactions  . Amoxicillin Swelling  . Penicillins Swelling    Has patient had a PCN reaction causing immediate rash, facial/tongue/throat swelling, SOB or lightheadedness with hypotension: Yes Has patient had a PCN reaction causing severe rash involving mucus membranes or skin necrosis: No Has patient had a PCN reaction that required hospitalization No Has patient had a PCN reaction occurring within the last 10 years: No If all of the above answers are "NO", then may proceed with Cephalosporin use.     Family History  Problem Relation Age of Onset  . Heart failure Mother   . Asthma Father     Prior to Admission medications   Medication Sig Start Date End Date Taking? Authorizing Provider  anastrozole (ARIMIDEX) 1 MG tablet Take 1 mg by mouth daily.   Yes Historical Provider, MD  dronedarone (MULTAQ) 400 MG tablet Take 400 mg by mouth 2 (two) times daily with a meal.   Yes Historical Provider, MD  furosemide (LASIX) 40 MG tablet Take 40 mg by mouth daily.   Yes Historical Provider, MD  glipiZIDE (GLUCOTROL XL) 10 MG 24 hr tablet Take 10 mg by mouth 2 (two) times daily.   Yes Historical Provider, MD  metFORMIN (GLUCOPHAGE-XR) 500 MG 24 hr tablet Take 500 mg by mouth  2 (two) times daily.   Yes Historical Provider, MD  metoprolol succinate (TOPROL-XL) 25 MG 24 hr tablet Take 25 mg by mouth 2 (two) times daily.   Yes Historical Provider, MD  Multiple Vitamins-Minerals (PRESERVISION AREDS 2+MULTI VIT PO) Take 2 tablets by mouth daily.   Yes Historical Provider, MD  tolterodine (DETROL) 2 MG tablet Take 2-4 mg by mouth 2 (two) times daily. Two tablets in the morning and one tablet in the evening   Yes Historical Provider, MD  zaleplon (SONATA) 5 MG capsule Take 5 mg by mouth at bedtime as needed for sleep.   Yes  Historical Provider, MD    Physical Exam: Vitals:   06/01/16 2000 06/01/16 2030 06/01/16 2100 06/01/16 2130  BP: 94/70 102/80 (!) 90/49 (!) 100/51  Pulse: 95 93 100 97  Resp:      Temp:      TempSrc:      SpO2: 93% 97% 96% (!) 88%  Weight:      Height:          Constitutional: NAD, calm, comfortable Vitals:   06/01/16 2000 06/01/16 2030 06/01/16 2100 06/01/16 2130  BP: 94/70 102/80 (!) 90/49 (!) 100/51  Pulse: 95 93 100 97  Resp:      Temp:      TempSrc:      SpO2: 93% 97% 96% (!) 88%  Weight:      Height:       Eyes: PERRL, lids and conjunctivae normal ENMT: Mucous membranes and lips are mildly dry. Posterior pharynx clear of any exudate or lesions.  Neck: normal, supple, no masses, no thyromegaly Respiratory: clear to auscultation bilaterally, no wheezing, no crackles. Normal respiratory effort. No accessory muscle use.  Cardiovascular: Regular rate and rhythm, no murmurs / rubs / gallops. No extremity edema. 2+ pedal pulses. No carotid bruits.  Abdomen: no tenderness, no masses palpated. No hepatosplenomegaly. Bowel sounds positive.  Musculoskeletal: no clubbing / cyanosis. Positive edema, tenderness and decreased range of motion of left knee. Normal muscle tone.  Skin: Right breast calor and erythema without induration. Neurologic: CN 2-12 grossly intact. Sensation intact, DTR normal. Strength 5/5 in all 4.  Psychiatric: Normal judgment and insight. Alert and oriented x 4. Normal mood.    Labs on Admission: I have personally reviewed following labs and imaging studies  CBC:  Recent Labs Lab 06/01/16 1551  WBC 11.5*  NEUTROABS 9.0*  HGB 14.2  HCT 41.1  MCV 85.3  PLT [id number]*   Basic Metabolic Panel:  Recent Labs Lab 06/01/16 1551  NA 124*  K 4.1  CL 90*  CO2 21*  GLUCOSE 559*  BUN 25*  CREATININE 1.35*  CALCIUM 8.8*  MG 1.9  PHOS 1.9*   GFR: Estimated Creatinine Clearance: 35 mL/min (by C-G formula based on SCr of 1.35 mg/dL (H)). Liver  Function Tests: No results for input(s): AST, ALT, ALKPHOS, BILITOT, PROT, ALBUMIN in the last 168 hours. No results for input(s): LIPASE, AMYLASE in the last 168 hours. No results for input(s): AMMONIA in the last 168 hours. Coagulation Profile: No results for input(s): INR, PROTIME in the last 168 hours. Cardiac Enzymes:  Recent Labs Lab 06/01/16 1605  CKTOTAL 457*   BNP (last 3 results) No results for input(s): PROBNP in the last 8760 hours. HbA1C: No results for input(s): HGBA1C in the last 72 hours. CBG:  Recent Labs Lab 06/01/16 1706 06/01/16 1801 06/01/16 1907 06/01/16 2011 06/01/16 2118  GLUCAP 464* 407* 350* 283* 233*  Lipid Profile: No results for input(s): CHOL, HDL, LDLCALC, TRIG, CHOLHDL, LDLDIRECT in the last 72 hours. Thyroid Function Tests: No results for input(s): TSH, T4TOTAL, FREET4, T3FREE, THYROIDAB in the last 72 hours. Anemia Panel: No results for input(s): VITAMINB12, FOLATE, FERRITIN, TIBC, IRON, RETICCTPCT in the last 72 hours. Urine analysis: No results found for: COLORURINE, APPEARANCEUR, [place], [place], [place], [place], [place], [place], [place], [place], NITRITE, LEUKOCYTESUR   Recent Results (from the past 240 hour(s))  Gram stain     Status: None   Collection Time: 06/01/16  4:50 PM  Result Value Ref Range Status   Specimen Description FLUID SYNOVIAL LEFT KNEE  Final   Special Requests NONE  Final   Gram Stain   Final    ABUNDANT WBC PRESENT, PREDOMINANTLY PMN RARE INTRACELLULAR GRAM POSITIVE COCCI Gram Stain Report Called to,Read Back By and Verified With: EDWARDS,C. AT 1804 ON 06/01/2016 BY BAUGHAM,M.    Report Status 06/01/2016 FINAL  Final     Radiological Exams on Admission: Dg Chest Port 1 View  Result Date: 06/01/2016 CLINICAL DATA:  Sepsis. EXAM: PORTABLE CHEST 1 VIEW COMPARISON:  None. FINDINGS: Borderline enlarged cardiac silhouette. Median sternotomy wires and prosthetic heart valve. Linear density in  both lower lung zones. Surgical clips overlying the right mid lung zone. Unremarkable bones. IMPRESSION: Bilateral lower lung zone linear atelectasis or scarring. Electronically Signed   By: Claudie Revering M.D.   On: 06/01/2016 19:50   Dg Knee Left Port  Result Date: 06/01/2016 CLINICAL DATA:  Pain and swelling since Saturday.  No known injury. EXAM: PORTABLE LEFT KNEE - 1-2 VIEW COMPARISON:  None. FINDINGS: The left knee demonstrates a total knee arthroplasty without evidence of hardware failure complication. There is a small joint effusion. There is no fracture or dislocation. The alignment is anatomic. IMPRESSION: Left total knee arthroplasty without failure or complication. Electronically Signed   By: Kathreen Devoid   On: 06/01/2016 16:22    EKG: Independently reviewed. Vent. rate 86 BPM PR interval 222 ms QRS duration 128 ms QT/QTc 408/488 ms P-R-T axes 76 -82 142 Right bundle branch block Left axis deviation Non-specific intra-ventricular conduction block Inferior infarct , age undetermined Anterolateral infarct , age undetermined Abnormal ECG No previous EKGs to compare to in the system.  Assessment/Plan Principal Problem:   Septic arthritis of knee, left (HCC) Admit to stepdown/inpatient. Continue IV fluids. Continue broad-spectrum IV antibiotics. Follow-up synovial fluid culture and sensitivity. Follow-up blood cultures and sensitivity. Orthopedic surgery evaluation at [facility].    Sepsis [facility]) Septic joint, cellulitis changes of breast or UTI. Continue current IV antibiotic treatment. MRSA screening.  Active Problems:   Uncontrolled type 2 diabetes mellitus (HCC) Continue insulin infusion. Continue IV fluids. Frequent CBG monitoring. Carbohydrate modified diet. Check hemoglobin A1c.    UTI (urinary tract infection) Continue broad-spectrum IV antibiotics. Follow-up urine culture and sensitivity.    Cellulitis of right breast Continue broad-spectrum IV  antibiotics.    Hypophosphatemia Supplementation ordered. Follow-up phosphorus level as needed.    CAD (coronary artery disease) No complaints of chest pains at this time. Metoprolol has been held due to hypotension. The patient is taking Multaq, but not sure if she has a history of arrhythmia.    Hypertension  hold antihypertensives for now and monitor blood pressure. Resume anti-hypertensive therapy if blood pressure starts trending up.    Lactic acidosis Metformin has been held. Continue IV fluids, IV antibiotics and supplemental oxygen as needed. Follow-up lactic acid level.   History of  breast cancer Continue Arimidex 1 mg by mouth daily.    DVT prophylaxis: Heparin SQ. Code Status: Full code. Family Communication:  Disposition Plan: Admit for rehydration, pain management, IV antibiotic therapy, blood glucose control, orthopedic surgery evaluation. Consults called: Dr. Delfino Lovett (orthopedic surgery) Admission status: Inpatient/stepdown at [place] MD [facility] Pager [phone].  If 7PM-7AM, please contact night-coverage [email or web address] Password [facility]  06/01/2016, 9:37 PM

## 2016-06-01 NOTE — ED Notes (Signed)
Per glucomander, insulin drip to 6.9

## 2016-06-01 NOTE — ED Notes (Signed)
CRITICAL VALUE ALER T  Critical value received:  Glucose 559  Date of notification:  06/01/16  Time of notification:  1716  Critical value read back:Yes.    Nurse who received alert:  Laurell Josephs RN  MD notified (1st page):  Sabra Heck  Time of first page:  1716  MD notified (2nd page):  Time of second page:  Responding MD:  Sabra Heck  Time MD responded:  [phone]

## 2016-06-01 NOTE — ED Notes (Signed)
CRITICAL VALUE ALERT  Critical value received:  Stat lactic 5.41  Date of notification:  06/01/16  Time of notification:  2026  Critical value read back:Yes.    Nurse who received alert:  Derek Mound, RN  MD notified (1st page):  Sabra Heck  Time of first page:  2026  MD notified (2nd page):  Time of second page:  Responding MD:  Sabra Heck  Time MD responded:  2026

## 2016-06-01 NOTE — ED Notes (Signed)
Patient could not provide urine at this time. She said she would notify staff when she could.

## 2016-06-01 NOTE — ED Notes (Signed)
Patient will notify staff when she can provide urine sample.

## 2016-06-01 NOTE — Progress Notes (Signed)
Pharmacy Antibiotic Note  Toni Hanna is a 80 y.o. female admitted on 06/01/2016 with sepsis.  Pharmacy has been consulted for vancomycin and aztreonam dosing.  Plan: Vancomycin 1500 mg IV X 1 then 1250 mg IV q24 hours Aztreonam 2 gm IV q8 hours F/u renal function, cultures and clinical course  Height: 5\' 3"  (160 cm) Weight: 182 lb (82.6 kg) IBW/kg (Calculated) : 52.4  Temp (24hrs), Avg:98.3 F (36.8 C), Min:98.2 F (36.8 C), Max:98.4 F (36.9 C)   Recent Labs Lab 06/01/16 1551  WBC 11.5*  CREATININE 1.35*    Estimated Creatinine Clearance: 35 mL/min (by C-G formula based on SCr of 1.35 mg/dL (H)).    Allergies  Allergen Reactions  . Amoxicillin Swelling  . Penicillins Swelling    Has patient had a PCN reaction causing immediate rash, facial/tongue/throat swelling, SOB or lightheadedness with hypotension: Yes Has patient had a PCN reaction causing severe rash involving mucus membranes or skin necrosis: No Has patient had a PCN reaction that required hospitalization No Has patient had a PCN reaction occurring within the last 10 years: No If all of the above answers are "NO", then may proceed with Cephalosporin use.     Antimicrobials this admission: vanc 10/2 >>  aztreonam 10/2 >>    Thank you for allowing pharmacy to be a part of this patient's care.  Excell Seltzer Poteet 06/01/2016 7:02 PM

## 2016-06-01 NOTE — ED Notes (Signed)
Per glucomander insulin drip started at 5.4

## 2016-06-02 DIAGNOSIS — E1165 Type 2 diabetes mellitus with hyperglycemia: Secondary | ICD-10-CM

## 2016-06-02 DIAGNOSIS — I10 Essential (primary) hypertension: Secondary | ICD-10-CM

## 2016-06-02 DIAGNOSIS — R739 Hyperglycemia, unspecified: Secondary | ICD-10-CM

## 2016-06-02 DIAGNOSIS — N61 Mastitis without abscess: Secondary | ICD-10-CM

## 2016-06-02 LAB — MAGNESIUM: MAGNESIUM: 1.9 mg/dL (ref 1.7–2.4)

## 2016-06-02 LAB — GLUCOSE, CAPILLARY
GLUCOSE-CAPILLARY: 175 mg/dL — AB (ref 65–99)
GLUCOSE-CAPILLARY: 194 mg/dL — AB (ref 65–99)
GLUCOSE-CAPILLARY: 154 mg/dL — AB (ref 65–99)
GLUCOSE-CAPILLARY: 107 mg/dL — AB (ref 65–99)
GLUCOSE-CAPILLARY: 124 mg/dL — AB (ref 65–99)
GLUCOSE-CAPILLARY: 239 mg/dL — AB (ref 65–99)
GLUCOSE-CAPILLARY: 286 mg/dL — AB (ref 65–99)
Glucose-Capillary: 117 mg/dL — ABNORMAL HIGH (ref 65–99)
Glucose-Capillary: 155 mg/dL — ABNORMAL HIGH (ref 65–99)

## 2016-06-02 LAB — COMPREHENSIVE METABOLIC PANEL
ALK PHOS: 56 U/L (ref 38–126)
ALT: 18 U/L (ref 14–54)
ANION GAP: 8 (ref 5–15)
AST: 23 U/L (ref 15–41)
Albumin: 2.3 g/dL — ABNORMAL LOW (ref 3.5–5.0)
BUN: 25 mg/dL — ABNORMAL HIGH (ref 6–20)
CALCIUM: 8 mg/dL — AB (ref 8.9–10.3)
CHLORIDE: 100 mmol/L — AB (ref 101–111)
CO2: 23 mmol/L (ref 22–32)
Creatinine, Ser: 0.9 mg/dL (ref 0.44–1.00)
GFR calc non Af Amer: 60 mL/min — ABNORMAL LOW (ref >60)
Glucose, Bld: 141 mg/dL — ABNORMAL HIGH (ref 65–99)
Potassium: 3.3 mmol/L — ABNORMAL LOW (ref 3.5–5.1)
SODIUM: 131 mmol/L — AB (ref 135–145)
Total Bilirubin: 1.2 mg/dL (ref 0.3–1.2)
Total Protein: 4.7 g/dL — ABNORMAL LOW (ref 6.5–8.1)

## 2016-06-02 LAB — BLOOD CULTURE ID PANEL (REFLEXED)
ACINETOBACTER BAUMANNII: NOT DETECTED
CARBAPENEM RESISTANCE: NOT DETECTED
Candida albicans: NOT DETECTED
Candida glabrata: NOT DETECTED
Candida krusei: NOT DETECTED
Candida parapsilosis: NOT DETECTED
Candida tropicalis: NOT DETECTED
ENTEROCOCCUS SPECIES: NOT DETECTED
Enterobacter cloacae complex: NOT DETECTED
Enterobacteriaceae species: NOT DETECTED
Escherichia coli: NOT DETECTED
HAEMOPHILUS INFLUENZAE: NOT DETECTED
Klebsiella oxytoca: NOT DETECTED
Klebsiella pneumoniae: NOT DETECTED
Listeria monocytogenes: NOT DETECTED
METHICILLIN RESISTANCE: NOT DETECTED
NEISSERIA MENINGITIDIS: NOT DETECTED
Proteus species: NOT DETECTED
Pseudomonas aeruginosa: NOT DETECTED
SERRATIA MARCESCENS: NOT DETECTED
STAPHYLOCOCCUS AUREUS BCID: NOT DETECTED
STREPTOCOCCUS AGALACTIAE: DETECTED — AB
STREPTOCOCCUS PNEUMONIAE: NOT DETECTED
STREPTOCOCCUS PYOGENES: NOT DETECTED
Staphylococcus species: NOT DETECTED
Streptococcus species: DETECTED — AB
Vancomycin resistance: NOT DETECTED

## 2016-06-02 LAB — CBC WITH DIFFERENTIAL/PLATELET
Basophils Absolute: 0 10*3/uL (ref 0.0–0.1)
Basophils Relative: 0 %
EOS ABS: 0 10*3/uL (ref 0.0–0.7)
EOS PCT: 1 %
HCT: 34.8 % — ABNORMAL LOW (ref 36.0–46.0)
Hemoglobin: 11.5 g/dL — ABNORMAL LOW (ref 12.0–15.0)
Lymphocytes Relative: 16 %
Lymphs Abs: 1.2 10*3/uL (ref 0.7–4.0)
MCH: 28.1 pg (ref 26.0–34.0)
MCHC: 33 g/dL (ref 30.0–36.0)
MCV: 85.1 fL (ref 78.0–100.0)
MONO ABS: 0.8 10*3/uL (ref 0.1–1.0)
Monocytes Relative: 11 %
NEUTROS ABS: 5.5 10*3/uL (ref 1.7–7.7)
Neutrophils Relative %: 73 %
PLATELETS: 113 10*3/uL — AB (ref 150–400)
RBC: 4.09 MIL/uL (ref 3.87–5.11)
RDW: 15.4 % (ref 11.5–15.5)
WBC: 7.5 10*3/uL (ref 4.0–10.5)

## 2016-06-02 LAB — HEMOGLOBIN A1C
Hgb A1c MFr Bld: 10.4 % — ABNORMAL HIGH (ref 4.8–5.6)
MEAN PLASMA GLUCOSE: 252 mg/dL

## 2016-06-02 LAB — LACTIC ACID, PLASMA
LACTIC ACID, VENOUS: 1.7 mmol/L (ref 0.5–1.9)
Lactic Acid, Venous: 2 mmol/L (ref 0.5–1.9)

## 2016-06-02 LAB — MRSA PCR SCREENING: MRSA BY PCR: NEGATIVE

## 2016-06-02 LAB — CBG MONITORING, ED: GLUCOSE-CAPILLARY: 146 mg/dL — AB (ref 65–99)

## 2016-06-02 LAB — PHOSPHORUS: Phosphorus: 1.2 mg/dL — ABNORMAL LOW (ref 2.5–4.6)

## 2016-06-02 MED ORDER — INSULIN ASPART 100 UNIT/ML ~~LOC~~ SOLN
0.0000 [IU] | Freq: Every day | SUBCUTANEOUS | Status: DC
  Administered 2016-06-02: 3 [IU] via SUBCUTANEOUS
  Administered 2016-06-03 – 2016-06-09 (×4): 2 [IU] via SUBCUTANEOUS

## 2016-06-02 MED ORDER — SODIUM CHLORIDE 0.9 % IV SOLN
INTRAVENOUS | Status: DC
  Administered 2016-06-02 – 2016-06-03 (×3): via INTRAVENOUS
  Administered 2016-06-04: 125 mL via INTRAVENOUS
  Administered 2016-06-04 – 2016-06-08 (×7): via INTRAVENOUS

## 2016-06-02 MED ORDER — INSULIN GLARGINE 100 UNIT/ML ~~LOC~~ SOLN
16.0000 [IU] | Freq: Every day | SUBCUTANEOUS | Status: DC
  Administered 2016-06-02 – 2016-06-04 (×3): 16 [IU] via SUBCUTANEOUS
  Filled 2016-06-02 (×4): qty 0.16

## 2016-06-02 MED ORDER — INFLUENZA VAC SPLIT QUAD 0.5 ML IM SUSY
0.5000 mL | PREFILLED_SYRINGE | INTRAMUSCULAR | Status: DC
  Filled 2016-06-02: qty 0.5

## 2016-06-02 MED ORDER — INSULIN ASPART 100 UNIT/ML ~~LOC~~ SOLN
0.0000 [IU] | Freq: Three times a day (TID) | SUBCUTANEOUS | Status: DC
  Administered 2016-06-02: 3 [IU] via SUBCUTANEOUS
  Administered 2016-06-03: 2 [IU] via SUBCUTANEOUS
  Administered 2016-06-03 (×2): 3 [IU] via SUBCUTANEOUS
  Administered 2016-06-04: 2 [IU] via SUBCUTANEOUS
  Administered 2016-06-04: 3 [IU] via SUBCUTANEOUS
  Administered 2016-06-05 (×2): 2 [IU] via SUBCUTANEOUS
  Administered 2016-06-05 – 2016-06-06 (×3): 3 [IU] via SUBCUTANEOUS
  Administered 2016-06-06 – 2016-06-07 (×3): 2 [IU] via SUBCUTANEOUS

## 2016-06-02 MED ORDER — WARFARIN SODIUM 5 MG PO TABS
2.5000 mg | ORAL_TABLET | Freq: Once | ORAL | Status: AC
Start: 2016-06-02 — End: 2016-06-02
  Administered 2016-06-02: 2.5 mg via ORAL
  Filled 2016-06-02 (×2): qty 1

## 2016-06-02 MED ORDER — ACETAMINOPHEN 325 MG PO TABS: 650.0000 mg | ORAL_TABLET | Freq: Four times a day (QID) | ORAL | Status: DC | PRN

## 2016-06-02 MED ORDER — KETOROLAC TROMETHAMINE 30 MG/ML IJ SOLN
INTRAMUSCULAR | Status: AC
  Filled 2016-06-02: qty 1

## 2016-06-02 MED ORDER — WARFARIN - PHARMACIST DOSING INPATIENT: Freq: Every day | Status: DC

## 2016-06-02 MED ORDER — INSULIN GLARGINE 100 UNIT/ML ~~LOC~~ SOLN
10.0000 [IU] | Freq: Every day | SUBCUTANEOUS | Status: DC
  Administered 2016-06-02: 10 [IU] via SUBCUTANEOUS
  Filled 2016-06-02: qty 0.1

## 2016-06-02 MED ORDER — SODIUM CHLORIDE 0.9 % IV BOLUS (SEPSIS)
500.0000 mL | Freq: Once | INTRAVENOUS | Status: AC
  Administered 2016-06-02: 500 mL via INTRAVENOUS

## 2016-06-02 NOTE — Progress Notes (Signed)
CRITICAL VALUE ALERT  Critical value received:  Lactic acid 2.0  Date of notification:  06/02/16   Time of notification:  5:38 AM   Critical value read back:Yes.    Nurse who received alert:  Reap, Jon Gills   MD notified (1st page):  [facility])  Time of first page:  5:39 AM   MD notified (2nd page):  Time of second page:  Responding MD:  [facility])  Time MD responded:  5:40 AM

## 2016-06-02 NOTE — ED Notes (Signed)
Pt assisted to bedside commode

## 2016-06-02 NOTE — Progress Notes (Signed)
Blood culture 1 of 2 with gram + cocci. Called ID on call for consult. Will obtain 2D echo and repeat blood cultures.   I also spoke with son over the phone with updates.   Awaiting ortho consult.    Dessa Phi, DO [facility] Pager [phone]  If 7PM-7AM, please contact night-coverage [email or web address] Password [email or web address] 06/02/2016, 4:23 PM

## 2016-06-02 NOTE — Progress Notes (Signed)
Inpatient Diabetes Program Recommendations  AACE/ADA: New Consensus Statement on Inpatient Glycemic Control (2015)  Target Ranges:  Prepandial:   less than 140 mg/dL      Peak postprandial:   less than 180 mg/dL (1-2 hours)      Critically ill patients:  140 - 180 mg/dL   Lab Results  Component Value Date   GLUCAP 107 (H) 06/02/2016    Review of Glycemic Control  Diabetes history: DM2 Outpatient Diabetes medications: Glucotrol 10 mg bid +Metformin 500 bid Current orders for Inpatient glycemic control: Lantus 10 units  Inpatient Diabetes Program Recommendations:   -Increase Lantus to 16 units daily (02. Units/kg) -Add Novolog correction sensitive 0-9 units tid with meals + 0-5 units hs -A1c to determine prehospital glycemic control  Thank you, Bethena Roys E. Lariyah Shetterly, RN, MSN, CDE Inpatient Glycemic Control Team Team Pager [phone] (8am-5pm) 06/02/2016 9:24 AM

## 2016-06-02 NOTE — Progress Notes (Signed)
ANTICOAGULATION CONSULT NOTE - Initial Consult  Pharmacy Consult for coumadin Indication: atrial fibrillation  Allergies  Allergen Reactions  . Amoxicillin Swelling  . Penicillins Swelling    Has patient had a PCN reaction causing immediate rash, facial/tongue/throat swelling, SOB or lightheadedness with hypotension: Yes Has patient had a PCN reaction causing severe rash involving mucus membranes or skin necrosis: No Has patient had a PCN reaction that required hospitalization No Has patient had a PCN reaction occurring within the last 10 years: No If all of the above answers are "NO", then may proceed with Cephalosporin use.     Patient Measurements: Height: 5\' 3"  (160 cm) Weight: 182 lb (82.6 kg) IBW/kg (Calculated) : 52.4   Vital Signs: Temp: 98.7 F (37.1 C) (10/03 1220) Temp Source: Axillary (10/03 1220) BP: 135/59 (10/03 1220) Pulse Rate: 99 (10/03 1220)  Labs:  Recent Labs  06/01/16 1551 06/01/16 1605 06/01/16 2306 06/02/16 0504  HGB 14.2  --  12.7 11.5*  HCT 41.1  --  37.0 34.8*  PLT 115*  --  121* 113*  APTT  --   --  59*  --   LABPROT  --   --  33.1*  --   INR  --   --  3.15  --   CREATININE 1.35*  --  0.95 0.90  CKTOTAL  --  457*  --   --     Estimated Creatinine Clearance: 52.5 mL/min (by C-G formula based on SCr of 0.9 mg/dL).   Medical History: Past Medical History:  Diagnosis Date  . Breast cancer ([place]) 2015  . Coronary artery disease   . Diabetes mellitus without complication ([place])   . Hypertension    Assessment: 80 yo F on warfarin PTA for afib,  pt from [facility] [place]. MD in [place] is Dierdre Searles at [phone], pt says this is who manages her warfarin, she doesn't know her dose; last dose taken 10/1 PTA.  I called the above #: her home dose is 5 mg daily, not able to provide last INR information; no coumadin given 10/2.  INR 10/2 PM = 3.15, pltc 113; ortho to be consulted today for septic knee  Goal of Therapy:  INR 2-3   Plan:  coumadin 2.5 mg po x  dose Daily INR  Eudelia Bunch, Pharm.D. BP:[phone] 06/02/2016 1:36 PM

## 2016-06-02 NOTE — Progress Notes (Signed)
PROGRESS NOTE    Toni Hanna  [id number] DOB: 1936/07/13 DOA: 06/01/2016 PCP: Robert Bellow, MD     Brief Narrative:  Toni Hanna is a 80 y.o. female with medical history significant of breast cancer, history of right breast lumpectomy, CAD, type 2 diabetes, hypertension who is coming to the emergency department due to left knee pain.  Per patient, she started having left knee pain on Friday which continued to get worse through the weekend. She had a left knee arthroplasty in [place] about 10 years ago. She states that she fell on the floor on Sunday evening due to weakness and was unable to get up from the floor, so she decided to go to sleep. This morning, when she woke up, she crawled to the other room to reach her phone, made a phone call for help and was subsequently brought to the emergency department. She estimates that she spent about 12 hours on the floor. She denies fever, but complains of chills, fatigue, generalized weakness, decreased appetite. Dr. Sabra Heck from the emergency department and spoke to Dr. Delfino Lovett (orthopedic surgery) who suggested transfer to [facility] for further evaluation and treatment.    Assessment & Plan:   Principal Problem:   Septic arthritis of knee, left (HCC) Active Problems:   Uncontrolled type 2 diabetes mellitus (HCC)   CAD (coronary artery disease)   Hypertension   Lactic acidosis   UTI (urinary tract infection)   Hypophosphatemia   History of breast cancer   Sepsis ([place])   Cellulitis of right breast  Sepsis secondary to septic arthritis left knee  -Fluid culture gram pos cocci in chains, culture pending   -Blood cultures NGTD  -Aztreonam, vanco  -IVF  -Orthopedic surgery consulted at time of admission   Sepsis secondary to right breast cellulitis and ?UTI -Continue current IV antibiotic treatment as above  Uncontrolled type 2 diabetes mellitus -Hold metformin, glipizide  -Check hemoglobin A1c -Lantus 15u qhs    -Add Novolog correction sensitive 0-9 units tid with meals + 0-5 units hs -Appreciate diabetic ed   Hypokalemia -Replace  Hypophosphatemia -Replace   A Fib on coumadin -Rate controlled -Metoprolol held due to low BP  -Tele -Pharmacy to adjust coumadin dosage  Hypertension -Metoprolol held due to low BP   Lactic acidosis -Resolved with IVF   History of breast cancer -Continue Arimidex 1 mg by mouth daily   DVT prophylaxis: coumadin Code Status: full Family Communication: No family present at bedside Disposition Plan: Pending further evaluation by orthopedic surgery. Continue IV antibiotics   Consultants:   Ortho  Procedures:   None  Antimicrobials:   Aztreonam 10/2 >>  Vanco 10/2 >>   Subjective: Patient states that starting on Saturday, she noticed significant pain in her left knee joint. She did have this knee replaced back in [place] in 2009. She denies any trauma that initiated this pain. She states that Saturday night, because of the pain, she fell and then could not get up. She slept on the floor that night. She called her PCP and was evaluated by Dr. on Monday. She was recommended to be further evaluated in the ER. She was sent to [facility] and was then transferred to [facility] for further evaluation. She denies any fevers, but states that she has had some chills. No chest pain, no shortness of breath, no nausea or vomiting. She does also notice right breast redness, pain. She did have right breast cancer and is status post  lumpectomy and radiation 2 years ago. No dysuria  Objective: Vitals:   06/02/16 0400 06/02/16 0500 06/02/16 0548 06/02/16 1220  BP: (!) 83/47 (!) 83/47 (!) 112/54 (!) 135/59  Pulse: 86 84 83 99  Resp: (!) 23 20 18 16   Temp: 99.3 F (37.4 C)   98.7 F (37.1 C)  TempSrc: Oral   Axillary  SpO2: 96% 97% 99%   Weight:      Height:        Intake/Output Summary (Last 24 hours) at 06/02/16 1416 Last data filed at 06/02/16  0500  Gross per 24 hour  Intake          3235.67 ml  Output                0 ml  Net          3235.67 ml   Filed Weights   06/01/16 1509  Weight: 82.6 kg (182 lb)    Examination:  General exam: Appears calm and comfortable  Respiratory system: Clear to auscultation. Respiratory effort normal. Cardiovascular system: S1 & S2 heard, RRR. No JVD, murmurs, rubs, gallops or clicks. No pedal edema. Gastrointestinal system: Abdomen is nondistended, soft and nontender. No organomegaly or masses felt. Normal bowel sounds heard. Central nervous system: Alert and oriented. No focal neurological deficits. Extremities: Symmetric 5 x 5 power. +left knee swelling, erythema, warmth. Pain with movement.  Skin: +right break with erythema and warmth compared to left  Psychiatry: Judgement and insight appear normal. Mood & affect appropriate.   Data Reviewed: I have personally reviewed following labs and imaging studies  CBC:  Recent Labs Lab 06/01/16 1551 06/01/16 2306 06/02/16 0504  WBC 11.5* 8.9 7.5  NEUTROABS 9.0* 6.8 5.5  HGB 14.2 12.7 11.5*  HCT 41.1 37.0 34.8*  MCV 85.3 84.7 85.1  PLT 115* 121* 123456*   Basic Metabolic Panel:  Recent Labs Lab 06/01/16 1551 06/01/16 2306 06/02/16 0504  NA 124* 132* 131*  K 4.1 3.5 3.3*  CL 90* 101 100*  CO2 21* 24 23  GLUCOSE 559* 169* 141*  BUN 25* 26* 25*  CREATININE 1.35* 0.95 0.90  CALCIUM 8.8* 8.1* 8.0*  MG 1.9  --  1.9  PHOS 1.9*  --  1.2*   GFR: Estimated Creatinine Clearance: 52.5 mL/min (by C-G formula based on SCr of 0.9 mg/dL). Liver Function Tests:  Recent Labs Lab 06/01/16 2306 06/02/16 0504  AST 28 23  ALT 21 18  ALKPHOS 63 56  BILITOT 1.1 1.2  PROT 5.4* 4.7*  ALBUMIN 2.7* 2.3*   No results for input(s): LIPASE, AMYLASE in the last 168 hours. No results for input(s): AMMONIA in the last 168 hours. Coagulation Profile:  Recent Labs Lab 06/01/16 2306  INR 3.15   Cardiac Enzymes:  Recent Labs Lab  06/01/16 1605  CKTOTAL 457*   BNP (last 3 results) No results for input(s): PROBNP in the last 8760 hours. HbA1C: No results for input(s): HGBA1C in the last 72 hours. CBG:  Recent Labs Lab 06/02/16 0407 06/02/16 0531 06/02/16 0700 06/02/16 0808 06/02/16 1226  GLUCAP 154* 117* 107* 124* 155*   Lipid Profile: No results for input(s): CHOL, HDL, LDLCALC, TRIG, CHOLHDL, LDLDIRECT in the last 72 hours. Thyroid Function Tests: No results for input(s): TSH, T4TOTAL, FREET4, T3FREE, THYROIDAB in the last 72 hours. Anemia Panel: No results for input(s): VITAMINB12, FOLATE, FERRITIN, TIBC, IRON, RETICCTPCT in the last 72 hours. Sepsis Labs:  Recent Labs Lab 06/01/16 1954 06/01/16 2018 06/01/16 2306  06/02/16 0503 06/02/16 0716  PROCALCITON  --   --  47.44  --   --   LATICACIDVEN 3.9* 5.41*  --  2.0* 1.7    Recent Results (from the past 240 hour(s))  Gram stain     Status: None   Collection Time: 06/01/16  4:50 PM  Result Value Ref Range Status   Specimen Description FLUID SYNOVIAL LEFT KNEE  Final   Special Requests NONE  Final   Gram Stain   Final    ABUNDANT WBC PRESENT, PREDOMINANTLY PMN RARE INTRACELLULAR GRAM POSITIVE COCCI Gram Stain Report Called to,Read Back By and Verified With: EDWARDS,C. AT 1804 ON 06/01/2016 BY BAUGHAM,M.    Report Status 06/01/2016 FINAL  Final  Culture, body fluid-bottle     Status: None (Preliminary result)   Collection Time: 06/01/16  4:50 PM  Result Value Ref Range Status   Specimen Description FLUID SYNOVIAL LEFT KNEE  Final   Special Requests BOTTLES DRAWN AEROBIC AND ANAEROBIC 3CC EACH  Final   Gram Stain   Final    GRAM POSITIVE COCCI IN CHAINS AEB AND ANA BOTTLES Gram Stain Report Called to,Read Back By and Verified With: REAP B AT [place] AT 0600 ON [id number] BY FORSYTH K    Culture PENDING  Incomplete   Report Status PENDING  Incomplete  Blood Culture (routine x 2)     Status: None (Preliminary result)   Collection Time: 06/01/16   7:42 PM  Result Value Ref Range Status   Specimen Description BLOOD LEFT HAND  Final   Special Requests BOTTLES DRAWN AEROBIC ONLY [place]  Final   Culture NO GROWTH < 24 HOURS  Final   Report Status PENDING  Incomplete  Blood Culture (routine x 2)     Status: None (Preliminary result)   Collection Time: 06/01/16  7:54 PM  Result Value Ref Range Status   Specimen Description BLOOD LEFT HAND  Final   Special Requests BOTTLES DRAWN AEROBIC ONLY [place]  Final   Culture NO GROWTH < 24 HOURS  Final   Report Status PENDING  Incomplete  MRSA PCR Screening     Status: None   Collection Time: 06/02/16  2:50 AM  Result Value Ref Range Status   MRSA by PCR NEGATIVE NEGATIVE Final    Comment:        The GeneXpert MRSA Assay (FDA approved for NASAL specimens only), is one component of a comprehensive MRSA colonization surveillance program. It is not intended to diagnose MRSA infection nor to guide or monitor treatment for MRSA infections.        Radiology Studies: Dg Chest Port 1 View  Result Date: 06/01/2016 CLINICAL DATA:  Sepsis. EXAM: PORTABLE CHEST 1 VIEW COMPARISON:  None. FINDINGS: Borderline enlarged cardiac silhouette. Median sternotomy wires and prosthetic heart valve. Linear density in both lower lung zones. Surgical clips overlying the right mid lung zone. Unremarkable bones. IMPRESSION: Bilateral lower lung zone linear atelectasis or scarring. Electronically Signed   By: Claudie Revering M.D.   On: 06/01/2016 19:50   Dg Knee Left Port  Result Date: 06/01/2016 CLINICAL DATA:  Pain and swelling since Saturday.  No known injury. EXAM: PORTABLE LEFT KNEE - 1-2 VIEW COMPARISON:  None. FINDINGS: The left knee demonstrates a total knee arthroplasty without evidence of hardware failure complication. There is a small joint effusion. There is no fracture or dislocation. The alignment is anatomic. IMPRESSION: Left total knee arthroplasty without failure or complication. Electronically Signed   By:  Elbert Ewings  Patel   On: 06/01/2016 16:22      Scheduled Meds: . anastrozole  1 mg Oral Daily  . aztreonam  2 g Intravenous Q8H  . dronedarone  400 mg Oral BID WC  . [START ON 06/03/2016] Influenza vac split quadrivalent PF  0.5 mL Intramuscular Tomorrow-1000  . insulin aspart  0-5 Units Subcutaneous QHS  . insulin aspart  0-9 Units Subcutaneous TID WC  . insulin glargine  16 Units Subcutaneous QHS  . oxybutynin  10 mg Oral QHS  . pantoprazole  40 mg Oral Daily  . phosphorus  500 mg Oral TID  . sodium chloride flush  3 mL Intravenous Q12H  . vancomycin  1,250 mg Intravenous Q24H  . warfarin  2.5 mg Oral ONCE-1800  . Warfarin - Pharmacist Dosing Inpatient   Does not apply q1800   Continuous Infusions: . sodium chloride 125 mL/hr at 06/02/16 1245     LOS: 1 day    Time spent: 40 minutes    Dessa Phi, DO [facility] Pager [phone]  If 7PM-7AM, please contact night-coverage [email or web address] Password [email or web address] 06/02/2016, 2:16 PM

## 2016-06-02 NOTE — Progress Notes (Signed)
CRITICAL VALUE ALERT  Critical value received:  Toni Hanna called with results of synovial fluid culture: gram positive cocci in chains  Date of notification:  06/02/16   Time of notification:  6:03 AM   Critical value read back:Yes.    Nurse who received alert:  Reap, Jon Gills   MD notified (1st page):  [facility])  Time of first page:  6:04 AM   MD notified (2nd page):  Time of second page:  Responding MD:    Time MD responded:

## 2016-06-03 ENCOUNTER — Inpatient Hospital Stay (HOSPITAL_COMMUNITY): Payer: Medicare Other

## 2016-06-03 DIAGNOSIS — A419 Sepsis, unspecified organism: Secondary | ICD-10-CM

## 2016-06-03 DIAGNOSIS — T8454XA Infection and inflammatory reaction due to internal left knee prosthesis, initial encounter: Principal | ICD-10-CM

## 2016-06-03 DIAGNOSIS — M00262 Other streptococcal arthritis, left knee: Secondary | ICD-10-CM

## 2016-06-03 DIAGNOSIS — R8271 Bacteriuria: Secondary | ICD-10-CM

## 2016-06-03 DIAGNOSIS — N390 Urinary tract infection, site not specified: Secondary | ICD-10-CM

## 2016-06-03 DIAGNOSIS — I5043 Acute on chronic combined systolic (congestive) and diastolic (congestive) heart failure: Secondary | ICD-10-CM

## 2016-06-03 DIAGNOSIS — R7881 Bacteremia: Secondary | ICD-10-CM

## 2016-06-03 DIAGNOSIS — Y792 Prosthetic and other implants, materials and accessory orthopedic devices associated with adverse incidents: Secondary | ICD-10-CM

## 2016-06-03 DIAGNOSIS — A491 Streptococcal infection, unspecified site: Secondary | ICD-10-CM

## 2016-06-03 DIAGNOSIS — Z853 Personal history of malignant neoplasm of breast: Secondary | ICD-10-CM

## 2016-06-03 DIAGNOSIS — A401 Sepsis due to streptococcus, group B: Secondary | ICD-10-CM

## 2016-06-03 DIAGNOSIS — E872 Acidosis: Secondary | ICD-10-CM

## 2016-06-03 DIAGNOSIS — B951 Streptococcus, group B, as the cause of diseases classified elsewhere: Secondary | ICD-10-CM

## 2016-06-03 DIAGNOSIS — M00062 Staphylococcal arthritis, left knee: Secondary | ICD-10-CM

## 2016-06-03 LAB — COMPREHENSIVE METABOLIC PANEL
ALBUMIN: 2.1 g/dL — AB (ref 3.5–5.0)
ALK PHOS: 55 U/L (ref 38–126)
ALT: 17 U/L (ref 14–54)
ANION GAP: 7 (ref 5–15)
AST: 17 U/L (ref 15–41)
BILIRUBIN TOTAL: 1.3 mg/dL — AB (ref 0.3–1.2)
BUN: 16 mg/dL (ref 6–20)
CALCIUM: 7.7 mg/dL — AB (ref 8.9–10.3)
CO2: 22 mmol/L (ref 22–32)
Chloride: 103 mmol/L (ref 101–111)
Creatinine, Ser: 0.67 mg/dL (ref 0.44–1.00)
GFR calc Af Amer: >60 mL/min (ref >60)
GLUCOSE: 212 mg/dL — AB (ref 65–99)
POTASSIUM: 3 mmol/L — AB (ref 3.5–5.1)
Sodium: 132 mmol/L — ABNORMAL LOW (ref 135–145)
TOTAL PROTEIN: 4.7 g/dL — AB (ref 6.5–8.1)

## 2016-06-03 LAB — CBC WITH DIFFERENTIAL/PLATELET
Basophils Absolute: 0 10*3/uL (ref 0.0–0.1)
Basophils Relative: 0 %
Eosinophils Absolute: 0 10*3/uL (ref 0.0–0.7)
Eosinophils Relative: 1 %
HEMATOCRIT: 32.7 % — AB (ref 36.0–46.0)
HEMOGLOBIN: 10.8 g/dL — AB (ref 12.0–15.0)
LYMPHS ABS: 0.8 10*3/uL (ref 0.7–4.0)
LYMPHS PCT: 12 %
MCH: 28.5 pg (ref 26.0–34.0)
MCHC: 33 g/dL (ref 30.0–36.0)
MCV: 86.3 fL (ref 78.0–100.0)
MONO ABS: 0.8 10*3/uL (ref 0.1–1.0)
MONOS PCT: 14 %
NEUTROS ABS: 4.5 10*3/uL (ref 1.7–7.7)
NEUTROS PCT: 73 %
Platelets: 114 10*3/uL — ABNORMAL LOW (ref 150–400)
RBC: 3.79 MIL/uL — ABNORMAL LOW (ref 3.87–5.11)
RDW: 15.2 % (ref 11.5–15.5)
WBC: 6.1 10*3/uL (ref 4.0–10.5)

## 2016-06-03 LAB — GLUCOSE, CAPILLARY
GLUCOSE-CAPILLARY: 244 mg/dL — AB (ref 65–99)
GLUCOSE-CAPILLARY: 240 mg/dL — AB (ref 65–99)
Glucose-Capillary: 198 mg/dL — ABNORMAL HIGH (ref 65–99)
Glucose-Capillary: 244 mg/dL — ABNORMAL HIGH (ref 65–99)

## 2016-06-03 LAB — PHOSPHORUS: PHOSPHORUS: 1.8 mg/dL — AB (ref 2.5–4.6)

## 2016-06-03 LAB — ECHOCARDIOGRAM COMPLETE
HEIGHTINCHES: 63 in
Weight: 2912 oz

## 2016-06-03 LAB — MAGNESIUM: Magnesium: 1.6 mg/dL — ABNORMAL LOW (ref 1.7–2.4)

## 2016-06-03 LAB — PROTIME-INR
INR: 2.18
Prothrombin Time: 24.6 seconds — ABNORMAL HIGH (ref 11.4–15.2)

## 2016-06-03 MED ORDER — OCUVITE-LUTEIN PO CAPS
2.0000 | ORAL_CAPSULE | Freq: Every day | ORAL | Status: DC
Start: 2016-06-03 — End: 2016-06-04
  Administered 2016-06-03: 2 via ORAL
  Filled 2016-06-03 (×2): qty 2

## 2016-06-03 MED ORDER — PERFLUTREN LIPID MICROSPHERE
1.0000 mL | INTRAVENOUS | Status: AC | PRN
  Administered 2016-06-03: 2 mL via INTRAVENOUS
  Filled 2016-06-03: qty 10

## 2016-06-03 MED ORDER — POTASSIUM CHLORIDE CRYS ER 20 MEQ PO TBCR
40.0000 meq | EXTENDED_RELEASE_TABLET | Freq: Once | ORAL | Status: AC
  Administered 2016-06-03: 40 meq via ORAL
  Filled 2016-06-03: qty 2

## 2016-06-03 MED ORDER — PERFLUTREN LIPID MICROSPHERE
INTRAVENOUS | Status: AC
  Filled 2016-06-03: qty 10

## 2016-06-03 MED ORDER — WARFARIN SODIUM 5 MG PO TABS
5.0000 mg | ORAL_TABLET | Freq: Every day | ORAL | Status: DC
  Filled 2016-06-03: qty 1

## 2016-06-03 MED ORDER — HEPARIN (PORCINE) IN NACL 100-0.45 UNIT/ML-% IJ SOLN
1200.0000 [IU]/h | INTRAMUSCULAR | Status: DC
  Administered 2016-06-03: 600 [IU]/h via INTRAVENOUS
  Filled 2016-06-03 (×2): qty 250

## 2016-06-03 MED ORDER — LOPERAMIDE HCL 2 MG PO CAPS
2.0000 mg | ORAL_CAPSULE | ORAL | Status: DC | PRN
  Administered 2016-06-03 – 2016-06-07 (×4): 2 mg via ORAL
  Filled 2016-06-03 (×4): qty 1

## 2016-06-03 MED ORDER — MAGNESIUM SULFATE 2 GM/50ML IV SOLN
2.0000 g | Freq: Once | INTRAVENOUS | Status: AC
  Administered 2016-06-03: 2 g via INTRAVENOUS
  Filled 2016-06-03: qty 50

## 2016-06-03 MED ORDER — LOSARTAN POTASSIUM 50 MG PO TABS
100.0000 mg | ORAL_TABLET | Freq: Every day | ORAL | Status: DC
  Administered 2016-06-03: 100 mg via ORAL
  Filled 2016-06-03 (×2): qty 2

## 2016-06-03 MED ORDER — MORPHINE SULFATE (PF) 2 MG/ML IV SOLN
2.0000 mg | INTRAVENOUS | Status: DC | PRN
  Administered 2016-06-03 – 2016-06-08 (×16): 2 mg via INTRAVENOUS
  Filled 2016-06-03 (×15): qty 1

## 2016-06-03 MED ORDER — DEXTROSE 5 % IV SOLN
2.0000 g | INTRAVENOUS | Status: DC
  Administered 2016-06-03 – 2016-06-10 (×7): 2 g via INTRAVENOUS
  Filled 2016-06-03 (×10): qty 2

## 2016-06-03 MED ORDER — CEFAZOLIN SODIUM-DEXTROSE 2-4 GM/100ML-% IV SOLN
2.0000 g | Freq: Three times a day (TID) | INTRAVENOUS | Status: DC
  Administered 2016-06-03: 2 g via INTRAVENOUS
  Filled 2016-06-03 (×3): qty 100

## 2016-06-03 MED ORDER — PHYTONADIONE 5 MG PO TABS
2.5000 mg | ORAL_TABLET | Freq: Once | ORAL | Status: AC
  Administered 2016-06-03: 2.5 mg via ORAL
  Filled 2016-06-03: qty 1

## 2016-06-03 NOTE — Progress Notes (Signed)
PHARMACY - PHYSICIAN COMMUNICATION CRITICAL VALUE ALERT - BLOOD CULTURE IDENTIFICATION (BCID)  Results for orders placed or performed during the hospital encounter of 06/01/16  Blood Culture ID Panel (Reflexed) (Collected: 06/01/2016  7:42 PM)  Result Value Ref Range   Enterococcus species NOT DETECTED NOT DETECTED   Vancomycin resistance NOT DETECTED NOT DETECTED   Listeria monocytogenes NOT DETECTED NOT DETECTED   Staphylococcus species NOT DETECTED NOT DETECTED   Staphylococcus aureus NOT DETECTED NOT DETECTED   Methicillin resistance NOT DETECTED NOT DETECTED   Streptococcus species DETECTED (A) NOT DETECTED   Streptococcus agalactiae DETECTED (A) NOT DETECTED   Streptococcus pneumoniae NOT DETECTED NOT DETECTED   Streptococcus pyogenes NOT DETECTED NOT DETECTED   Acinetobacter baumannii NOT DETECTED NOT DETECTED   Enterobacteriaceae species NOT DETECTED NOT DETECTED   Enterobacter cloacae complex NOT DETECTED NOT DETECTED   Escherichia coli NOT DETECTED NOT DETECTED   Klebsiella oxytoca NOT DETECTED NOT DETECTED   Klebsiella pneumoniae NOT DETECTED NOT DETECTED   Proteus species NOT DETECTED NOT DETECTED   Serratia marcescens NOT DETECTED NOT DETECTED   Carbapenem resistance NOT DETECTED NOT DETECTED   Haemophilus influenzae NOT DETECTED NOT DETECTED   Neisseria meningitidis NOT DETECTED NOT DETECTED   Pseudomonas aeruginosa NOT DETECTED NOT DETECTED   Candida albicans NOT DETECTED NOT DETECTED   Candida glabrata NOT DETECTED NOT DETECTED   Candida krusei NOT DETECTED NOT DETECTED   Candida parapsilosis NOT DETECTED NOT DETECTED   Candida tropicalis NOT DETECTED NOT DETECTED    Name of physician (or Provider) Contacted:   KSchorr  Changes to prescribed antibiotics required:    Due to PCN allergy and unclear severity, no changes at this time.  If able to tolerate cephalosporins, consider changing to [place] 06/03/2016  4:51 AM

## 2016-06-03 NOTE — Progress Notes (Addendum)
[place] for DC coumadin, start IV heparin infusion  Indication: atrial fibrillation  Allergies  Allergen Reactions  . Amoxicillin Swelling  . Penicillins Swelling    Has patient had a PCN reaction causing immediate rash, facial/tongue/throat swelling, SOB or lightheadedness with hypotension: Yes Has patient had a PCN reaction causing severe rash involving mucus membranes or skin necrosis: No Has patient had a PCN reaction that required hospitalization No Has patient had a PCN reaction occurring within the last 10 years: No If all of the above answers are "NO", then may proceed with Cephalosporin use.     Patient Measurements: Height: 5\' 3"  (160 cm) Weight: 182 lb (82.6 kg) IBW/kg (Calculated) : 52.4  Heparin dosing weight: 70.6 kg  Vital Signs: Temp: 99.2 F (37.3 C) (10/04 1230) Temp Source: Oral (10/04 1230) BP: 165/64 (10/04 1230) Pulse Rate: 92 (10/04 1700)  Labs:  Recent Labs  06/01/16 1605 06/01/16 2306 06/02/16 0504 06/03/16 0212  HGB  --  12.7 11.5* 10.8*  HCT  --  37.0 34.8* 32.7*  PLT  --  121* 113* 114*  APTT  --  59*  --   --   LABPROT  --  33.1*  --  24.6*  INR  --  3.15  --  2.18  CREATININE  --  0.95 0.90 0.67  CKTOTAL 457*  --   --   --     Estimated Creatinine Clearance: 59 mL/min (by C-G formula based on SCr of 0.67 mg/dL).   Medical History: Past Medical History:  Diagnosis Date  . Breast cancer ([place]) 2015  . Coronary artery disease   . Diabetes mellitus without complication ([place])   . Hypertension    Assessment: 80 yo F on warfarin PTA for history of paryoxysmal atrial fibrillation. Also h/o previous Maze procedure performed, 2003 with mitral valve repair. No coumadin given 10/2.  Coumadin 2.5 mg given 10/3. INR 2.18 today is therapeutic, pltc remains low at 114.  No bleeding reported  She also has h/o left prosthetic knee placement.  ID consulted for management of this patient with bacteremia  known valve repair and prosthetic joint infection with group B strep. ID notes needs to have surgery on her prosthetic knee but she has not yet been seen by orthopedic surgery.Noted that patient asking for transfer to [place] where the patient lives in [place]. ID recommend that she have surgery here and done promptly to control the source of her infection prior to transfer of care.  Thus coumadin dc'd tonight and pharmacy consulted to start IV heparin     Goal of Therapy:  Heparin level = 0.3-0.7   Plan:  Daily INR Start IV heparin drip at lower rate 600 units/hr Heparin level in 8 hours Daily Heparin level and CBC.   Thank you for allowing pharmacy to be part of this patients care team. Nicole Cella, RPh Clinical Pharmacist Pager: [phone] 06/03/2016 6:34 PM

## 2016-06-03 NOTE — Progress Notes (Signed)
Pharmacy Antibiotic Note  Toni Hanna is a 80 y.o. female admitted on 06/01/2016 with sepsis.  Pharmacy has been consulted for ancef dosing.  vanc/aztreonam day #3>>ancef ; septic arthritis L knee and cellulitis of R breast and possible  UTI; L knee arthroplasty 10 years ago, fell Sunday and spent 12 hours on the floor; creat 1.35>0.9>0.67  creat cl 59, WBC WNL, LA 5.4>2,>1.7;  AF, ID consulted by [facility]; echo ordered group B strep in knee cx and 1 of 2 BC- PCN allergy, 2nd line tx: mild PNC allergy Ancef 2 gm q8h, severe PCN allergy vancomycin Per pt her PCN was over 40 yrs ago and was swelling of her hands and face and hive with PCN, she thinks she may have taken ceftin for bronchitis in the past.  She is willing to try ancef and understands the 5-15% chance of an allergic reaction. D/w pt, son and Dr Ree Kida, pt and Dr Ree Kida agree to try ancef, d/w RN Vicente Males to watch for allergic rxn   Plan: DC aztreonam and vancomycin Ancef 2 gm IV q8h - observe closely for allergic reaction  Height: 5\' 3"  (160 cm) Weight: 182 lb (82.6 kg) IBW/kg (Calculated) : 52.4  Temp (24hrs), Avg:99.3 F (37.4 C), Min:98.7 F (37.1 C), Max:100.1 F (37.8 C)   Recent Labs Lab 06/01/16 1551 06/01/16 1954 06/01/16 2018 06/01/16 2306 06/02/16 0503 06/02/16 0504 06/02/16 0716 06/03/16 0212  WBC 11.5*  --   --  8.9  --  7.5  --  6.1  CREATININE 1.35*  --   --  0.95  --  0.90  --  0.67  LATICACIDVEN  --  3.9* 5.41*  --  2.0*  --  1.7  --     Estimated Creatinine Clearance: 59 mL/min (by C-G formula based on SCr of 0.67 mg/dL).    Allergies  Allergen Reactions  . Amoxicillin Swelling  . Penicillins Swelling    Has patient had a PCN reaction causing immediate rash, facial/tongue/throat swelling, SOB or lightheadedness with hypotension: Yes Has patient had a PCN reaction causing severe rash involving mucus membranes or skin necrosis: No Has patient had a PCN reaction that required hospitalization No Has patient  had a PCN reaction occurring within the last 10 years: No If all of the above answers are "NO", then may proceed with Cephalosporin use.     vanc 10/2 >> 10/4 aztreonam 10/2 >> 10/4 Ancef 10/4>>  Dose adjustments this admission: none  Microbiology results:  10/2 Synovial fluid cx>> gram positive cocci in chains= Strep aglactiae 10/3 MRSA PCR neg 10/2 Ucx> 10/2 BCx2>> 1 of 2 BC strep agalactiae 10/3 BCx2>>   Thank you for allowing pharmacy to be a part of this patient's care.  Eudelia Bunch, Pharm.D. QP:[id number] 06/03/2016 11:43 AM

## 2016-06-03 NOTE — Progress Notes (Signed)
[place] for coumadin Indication: atrial fibrillation  Allergies  Allergen Reactions  . Amoxicillin Swelling  . Penicillins Swelling    Has patient had a PCN reaction causing immediate rash, facial/tongue/throat swelling, SOB or lightheadedness with hypotension: Yes Has patient had a PCN reaction causing severe rash involving mucus membranes or skin necrosis: No Has patient had a PCN reaction that required hospitalization No Has patient had a PCN reaction occurring within the last 10 years: No If all of the above answers are "NO", then may proceed with Cephalosporin use.     Patient Measurements: Height: 5\' 3"  (160 cm) Weight: 182 lb (82.6 kg) IBW/kg (Calculated) : 52.4   Vital Signs: Temp: 98.7 F (37.1 C) (10/04 0741) Temp Source: Axillary (10/04 0741) BP: 128/87 (10/04 0741) Pulse Rate: 49 (10/04 0741)  Labs:  Recent Labs  06/01/16 1605 06/01/16 2306 06/02/16 0504 06/03/16 0212  HGB  --  12.7 11.5* 10.8*  HCT  --  37.0 34.8* 32.7*  PLT  --  121* 113* 114*  APTT  --  59*  --   --   LABPROT  --  33.1*  --  24.6*  INR  --  3.15  --  2.18  CREATININE  --  0.95 0.90 0.67  CKTOTAL 457*  --   --   --     Estimated Creatinine Clearance: 59 mL/min (by C-G formula based on SCr of 0.67 mg/dL).   Medical History: Past Medical History:  Diagnosis Date  . Breast cancer ([place]) 2015  . Coronary artery disease   . Diabetes mellitus without complication ([place])   . Hypertension    Assessment: 80 yo F on warfarin PTA for afib,  pt from [place]. MD in [place] is Dierdre Searles at [phone], pt says this is who manages her warfarin, she doesn't know her dose; last dose taken 10/1 PTA.  I called the above #: her home dose is 5 mg daily, not able to provide last INR information; no coumadin given 10/2.  INR 2.18 today is therapeutic, pltc remains low at 114.  No bleeding reported.  No notes from ortho yet, unsure if any invasive  procedures will be needed.  Goal of Therapy:  INR 2-3   Plan: resume home coumadin dose of 5 mg daily Daily INR  Eudelia Bunch, Pharm.D. [id number] 06/03/2016 10:53 AM

## 2016-06-03 NOTE — Consult Note (Signed)
Admit date: 06/01/2016 Referring Physician  Dr. Ree Kida Primary Physician Robert Bellow, MD Primary Cardiologist  New Reason for Consultation  cardiomyopathy  HPI: 80 year old with history of mitral valve repair, prosthetic left knee replacement, breast cancer, visiting from [place] who was admitted with right breast erythema, severe knee pain, chills, fatigue, blood cultures with group B strep.   In review of infectious disease note, she had asked for transfer to [place] where they live at [facility]. Recommendation to have surgery on her prosthetic knee was recommended.  An echocardiogram transthoracic was performed which showed ejection fraction of 30-35% with mitral valve repair, mild MR, moderate tricuspid regurgitation with moderately elevated pulmonary pressures.  A TEE was also requested.  Based upon her current medication regimen as an outpatient, I would suspect that she has a chronic systolic heart failure/cardiomyopathy. She is on beta blocker, angiotensin receptor blocker.  A proximally 3 years ago in [place] she developed paroxysmal atrial fibrillation and was placed on Multaq. She has been on warfarin as an outpatient.  There is previous Maze procedure performed, 2003 with mitral valve repair.  She's not complaining of any chest pain, shortness of breath. She did have an echocardiogram in June which demonstrated an EF of 50-55%.  PMH:   Past Medical History:  Diagnosis Date  . Breast cancer ([place]) 2015  . Coronary artery disease   . Diabetes mellitus without complication ([place])   . Hypertension     PSH:   Past Surgical History:  Procedure Laterality Date  . BREAST LUMPECTOMY Right   . CARDIAC SURGERY    . cataract surgery    . CHOLECYSTECTOMY    . EYE SURGERY     Allergies:  Amoxicillin and Penicillins Prior to Admit Meds:   Prior to Admission medications   Medication Sig Start Date End Date Taking? Authorizing Provider  anastrozole  (ARIMIDEX) 1 MG tablet Take 1 mg by mouth daily.   Yes Historical Provider, MD  dronedarone (MULTAQ) 400 MG tablet Take 400 mg by mouth 2 (two) times daily with a meal.   Yes Historical Provider, MD  furosemide (LASIX) 40 MG tablet Take 40 mg by mouth daily.   Yes Historical Provider, MD  glipiZIDE (GLUCOTROL XL) 10 MG 24 hr tablet Take 10 mg by mouth daily with breakfast.    Yes Historical Provider, MD  losartan (COZAAR) 100 MG tablet Take 100 mg by mouth daily.   Yes Historical Provider, MD  metFORMIN (GLUCOPHAGE-XR) 500 MG 24 hr tablet Take 500 mg by mouth 2 (two) times daily.   Yes Historical Provider, MD  metoprolol succinate (TOPROL-XL) 25 MG 24 hr tablet Take 25 mg by mouth 2 (two) times daily.   Yes Historical Provider, MD  Multiple Vitamins-Minerals (PRESERVISION AREDS 2+MULTI VIT PO) Take 2 tablets by mouth daily.   Yes Historical Provider, MD  tolterodine (DETROL) 2 MG tablet Take 2-4 mg by mouth 2 (two) times daily. Two tablets in the morning and one tablet in the evening   Yes Historical Provider, MD  WARFARIN SODIUM PO Take 5 mg by mouth daily. Dose from [facility] [place], [place], Dr Dierdre Searles at [phone]   Yes Historical Provider, MD  zaleplon (SONATA) 5 MG capsule Take 5 mg by mouth at bedtime as needed for sleep.   Yes Historical Provider, MD   Scheduled Meds: . anastrozole  1 mg Oral Daily  . cefTRIAXone (ROCEPHIN)  IV  2 g Intravenous Q24H  . dronedarone  400 mg Oral  BID WC  . Influenza vac split quadrivalent PF  0.5 mL Intramuscular Tomorrow-1000  . insulin aspart  0-5 Units Subcutaneous QHS  . insulin aspart  0-9 Units Subcutaneous TID WC  . insulin glargine  16 Units Subcutaneous QHS  . losartan  100 mg Oral Daily  . multivitamin-lutein  2 capsule Oral Daily  . oxybutynin  10 mg Oral QHS  . pantoprazole  40 mg Oral Daily  . perflutren lipid microspheres (DEFINITY) IV suspension      . sodium chloride flush  3 mL Intravenous Q12H  . warfarin  5 mg Oral q1800  .  Warfarin - Pharmacist Dosing Inpatient   Does not apply q1800   Continuous Infusions: . sodium chloride 125 mL/hr at 06/03/16 1446   PRN Meds:.acetaminophen, dextrose, loperamide, morphine injection, zolpidem  Fam HX:    Family History  Problem Relation Age of Onset  . Heart failure Mother   . Asthma Father    Social HX:    Social History   Social History  . Marital status: Widowed    Spouse name: N/A  . Number of children: N/A  . Years of education: N/A   Occupational History  . Not on file.   Social History Main Topics  . Smoking status: Former Research scientist (life sciences)  . Smokeless tobacco: Never Used  . Alcohol use No  . Drug use: No  . Sexual activity: Not on file   Other Topics Concern  . Not on file   Social History Narrative  . No narrative on file     ROS:  All 11 ROS were addressed and are negative except what is stated in the HPI   Physical Exam: Blood pressure (!) 165/64, pulse 92, temperature 99.2 F (37.3 C), temperature source Oral, resp. rate (!) 21, height 5\' 3"  (1.6 m), weight 182 lb (82.6 kg), SpO2 97 %.   General: Well developed, well nourished, in no acute distress Head: Eyes PERRLA, No xanthomas.   Normal cephalic and atramatic  Lungs:   Clear bilaterally to auscultation and percussion. Normal respiratory effort. No wheezes, no rales. Heart:   HRRR S1 S2 Pulses are 2+ & equal. No murmur, rubs, gallops.  No carotid bruit. No JVD.  No abdominal bruits. Chest scar noted Abdomen: Bowel sounds are positive, abdomen soft and non-tender without masses. No hepatosplenomegaly. Msk:  Back normal. Normal strength and tone for age. Extremities:  No clubbing, cyanosis or edema. Mild erythema noted over left knee DP +1 Neuro: Alert and oriented X 3, non-focal, MAE x 4 GU: Deferred Rectal: Deferred Psych:  Good affect, responds appropriately      Labs: Lab Results  Component Value Date   WBC 6.1 06/03/2016   HGB 10.8 (L) 06/03/2016   HCT 32.7 (L) 06/03/2016    MCV 86.3 06/03/2016   PLT 114 (L) 06/03/2016     Recent Labs Lab 06/03/16 0212  NA 132*  K 3.0*  CL 103  CO2 22  BUN 16  CREATININE 0.67  CALCIUM 7.7*  PROT 4.7*  BILITOT 1.3*  ALKPHOS 55  ALT 17  AST 17  GLUCOSE 212*    Recent Labs  06/01/16 1605  CKTOTAL 457*   No results found for: CHOL, HDL, LDLCALC, TRIG No results found for: DDIMER   Radiology:  Dg Chest Port 1 View  Result Date: 06/01/2016 CLINICAL DATA:  Sepsis. EXAM: PORTABLE CHEST 1 VIEW COMPARISON:  None. FINDINGS: Borderline enlarged cardiac silhouette. Median sternotomy wires and prosthetic heart valve. Linear density  in both lower lung zones. Surgical clips overlying the right mid lung zone. Unremarkable bones. IMPRESSION: Bilateral lower lung zone linear atelectasis or scarring. Electronically Signed   By: Claudie Revering M.D.   On: 06/01/2016 19:50   Personally viewed.  EKG: Sinus rhythm, heart rate 86 bpm with nonspecific interventricular conduction delay.  Personally viewed.   Echocardiogram 06/03/16: - Left ventricle: The cavity size was normal. Wall thickness was  increased in a pattern of mild LVH. Systolic function was moderately to severely reduced. The estimated ejection fraction was in the range of 30% to 35%. There is hypokinesis of the anteroseptal and apical myocardium. The study is not technically sufficient to allow evaluation of LV diastolic function. - Mitral valve: Prior procedures included surgical repair. Valve   area by pressure half-time: 2.08 cm^2. Valve area by continuity   equation (using LVOT flow): 1.23 cm^2. - Left atrium: The atrium was moderately dilated. - Right atrium: The atrium was moderately dilated. - Tricuspid valve: There was moderate regurgitation. - Pulmonary arteries: Systolic pressure was severely increased. PA   peak pressure: 60 mm Hg (S). - Pericardium, extracardiac: A trivial pericardial effusion was   identified.  Impressions:  - Technically difficult;  definity used; septal and apical   hypokinesis with overall moderate to severe LV dysfunction; s/p   MV repair with mild MR; biatrial enlargement; moderate TR with   severely elevated pulmonary pressure.   ASSESSMENT/PLAN:    80 year old female with prior mitral valve repair, paroxysmal atrial fibrillation status post maze procedure, on chronic anticoagulation with prosthetic left knee, gram-positive bacteremia, LV systolic dysfunction.  LV systolic dysfunction/likely chronic systolic heart failure  - Ejection fraction on echocardiogram 30-35%  - It would be nice to have records from [place] - per collaboration with primary team, her echocardiogram in June showed ejection fraction of 50-55%.  -  beta blocker, angiotensin receptor blocker  - She does not appear to be volume overloaded.  Paroxysmal atrial fibrillation  - Currently on Multaq  - There is a contraindication to this medication in anyone with symptomatic heart failure, that is heart failure with recent decompensation requiring hospitalization or class IV symptoms). Since she does not have current decompensated heart failure, and since she has been on this medication for quite some time, we will continue at this point. Have caution if heart failure symptoms advance.  Gram-positive bacteremia  - We will set up for transesophageal echocardiogram, agree with infectious disease consultation, Dr. Drucilla Schmidt  - I discussed with Trish, hopefully transesophageal echocardiogram will be performed at 10 AM tomorrow.  - Risks and benefits of procedure have been discussed including esophageal perforation. She and her son were present for discussion.  Based upon her current cardiovascular clinical status, she does not appear to be in heart failure currently, she is not short of breath, no chest pain. I do not see any clinical contraindication from a cardiac standpoint for her transportation to [facility] if she desires. All of her family and support  system is there.  Candee Furbish, MD  06/03/2016  5:09 PM

## 2016-06-03 NOTE — Consult Note (Signed)
ORTHOPAEDIC CONSULTATION  REQUESTING PHYSICIAN: Cristal Ford, DO  PCP:  Robert Bellow, MD  Chief Complaint: Periprosthetic left knee infection  HPI: Toni Hanna is a 80 y.o. female who complains of approximately 1 week of increasing left knee pain and swelling. She has a history of a left total knee replacement done about 10 years ago in [place]. She reports no previous pain in her left knee or problems with wound healing after surgery. The patient resides in [place], and she was visiting her vacation home in [place]. She went to the emergency department at [facility], where an aspiration of the left knee was performed. Cultures are now growing GBS. X-rays of the left knee were obtained showing a cemented well fixed posterior stabilized total knee replacement. She was transferred to [facility] and admitted to the hospitalist service. Infectious disease has seen the patient. Patient's hemoglobin A1c is 10.4. Blood cultures are now positive for GBS. She reports that she has been noncompliant with her diabetic diet. She was found to have a mastitis of the breast which has subsequently improved on IV antibiotics. She takes Coumadin for atrial fibrillation. Her INR today is greater than 2.  Past Medical History:  Diagnosis Date  . Breast cancer ([place]) 2015  . Coronary artery disease   . Diabetes mellitus without complication ([place])   . Hypertension    Past Surgical History:  Procedure Laterality Date  . BREAST LUMPECTOMY Right   . CARDIAC SURGERY    . cataract surgery    . CHOLECYSTECTOMY    . EYE SURGERY     Social History   Social History  . Marital status: Widowed    Spouse name: N/A  . Number of children: N/A  . Years of education: N/A   Social History Main Topics  . Smoking status: Former Research scientist (life sciences)  . Smokeless tobacco: Never Used  . Alcohol use No  . Drug use: No  . Sexual activity: Not Asked   Other Topics Concern  . None   Social History  Narrative  . None   Family History  Problem Relation Age of Onset  . Heart failure Mother   . Asthma Father    Allergies  Allergen Reactions  . Amoxicillin Swelling  . Penicillins Swelling    Has patient had a PCN reaction causing immediate rash, facial/tongue/throat swelling, SOB or lightheadedness with hypotension: Yes Has patient had a PCN reaction causing severe rash involving mucus membranes or skin necrosis: No Has patient had a PCN reaction that required hospitalization No Has patient had a PCN reaction occurring within the last 10 years: No If all of the above answers are "NO", then may proceed with Cephalosporin use.    Prior to Admission medications   Medication Sig Start Date End Date Taking? Authorizing Provider  anastrozole (ARIMIDEX) 1 MG tablet Take 1 mg by mouth daily.   Yes Historical Provider, MD  dronedarone (MULTAQ) 400 MG tablet Take 400 mg by mouth 2 (two) times daily with a meal.   Yes Historical Provider, MD  furosemide (LASIX) 40 MG tablet Take 40 mg by mouth daily.   Yes Historical Provider, MD  glipiZIDE (GLUCOTROL XL) 10 MG 24 hr tablet Take 10 mg by mouth daily with breakfast.    Yes Historical Provider, MD  losartan (COZAAR) 100 MG tablet Take 100 mg by mouth daily.   Yes Historical Provider, MD  metFORMIN (GLUCOPHAGE-XR) 500 MG 24 hr tablet Take 500 mg by mouth 2 (  two) times daily.   Yes Historical Provider, MD  metoprolol succinate (TOPROL-XL) 25 MG 24 hr tablet Take 25 mg by mouth 2 (two) times daily.   Yes Historical Provider, MD  Multiple Vitamins-Minerals (PRESERVISION AREDS 2+MULTI VIT PO) Take 2 tablets by mouth daily.   Yes Historical Provider, MD  tolterodine (DETROL) 2 MG tablet Take 2-4 mg by mouth 2 (two) times daily. Two tablets in the morning and one tablet in the evening   Yes Historical Provider, MD  WARFARIN SODIUM PO Take 5 mg by mouth daily. Dose from [facility], [place], Dr Dierdre Searles at [phone]   Yes Historical Provider, MD    zaleplon (SONATA) 5 MG capsule Take 5 mg by mouth at bedtime as needed for sleep.   Yes Historical Provider, MD   Dg Chest Port 1 View  Result Date: 06/01/2016 CLINICAL DATA:  Sepsis. EXAM: PORTABLE CHEST 1 VIEW COMPARISON:  None. FINDINGS: Borderline enlarged cardiac silhouette. Median sternotomy wires and prosthetic heart valve. Linear density in both lower lung zones. Surgical clips overlying the right mid lung zone. Unremarkable bones. IMPRESSION: Bilateral lower lung zone linear atelectasis or scarring. Electronically Signed   By: Claudie Revering M.D.   On: 06/01/2016 19:50    Positive ROS: All other systems have been reviewed and were otherwise negative with the exception of those mentioned in the HPI and as above.  Physical Exam: General: Alert, no acute distress Cardiovascular: No pedal edema Respiratory: No cyanosis, no use of accessory musculature GI: No organomegaly, abdomen is soft and non-tender Skin: No lesions in the area of chief complaint Neurologic: Sensation intact distally Psychiatric: Patient is competent for consent with normal mood and affect Lymphatic: No axillary or cervical lymphadenopathy  MUSCULOSKELETAL: Examination of the left knee reveals a well-healed midline incision. She has a large effusion with some warmth. Range of motion is limited due to knee pain. Distally, she has intact dorsiflexion, plantarflexion, and great toe extension. She reports intact sensation to light touch. She has a 2+ DP pulse.  Assessment: GBS periprosthetic joint infection, left knee. GBS bacteremia Mastitis  Plan: I discussed the findings with the patient and her son who was at the bedside. We discussed that the gold standard treatment for her infected knee replacement is a two-stage procedure. We discussed the risks, benefits, and alternatives to resection left total knee arthroplasty with placement of antibiotic impregnated spacer. They understand that she will need prolonged IV  antibiotics and a PICC line. We discussed the importance of improved glycemic control looking forward to her reimplantation procedure in the future. I spoke with Dr. Ree Kida regarding the patient's anticoagulation. She is going to give oral vitamin K, discontinue the patient's Coumadin, and start an IV heparin drip for now. We will plan for surgery Friday morning so long as her INR is within the appropriate range. The family reports that they're interested in transferring the patient to [place] after the surgery is done.  The risks, benefits, and alternatives were discussed with the patient. There are risks associated with the surgery including, but not limited to, problems with anesthesia (death), infection, instability (giving out of the joint), dislocation, differences in leg length/angulation/rotation, fracture of bones, loosening or failure of implants, hematoma (blood accumulation) which may require surgical drainage, blood clots, pulmonary embolism, nerve injury (foot drop), and blood vessel injury. The patient understands these risks and elects to proceed.   Valary Manahan, Horald Pollen, MD Cell [phone]    06/03/2016 6:48 PM

## 2016-06-03 NOTE — Progress Notes (Signed)
Pt is from [place], [place], and wants to transfer to [facility] where family is.  Son is present and states his wife is a Psychologist, sport and exercise in [employer] and is attempting to find a physician to accept her in transfer.  Provided a price for ambulance transport @ family's request.

## 2016-06-03 NOTE — Consult Note (Signed)
Date of Admission:  06/01/2016  Date of Consult:  06/03/2016  Reason for Consult: Group B strep prosthetic joint infection and bacteremia Referring Physician: Dr. Ree Kida  HPI: Toni Hanna is an 81 y.o. female with hx of breast cancer, sp lumpectomy axillary node dissection, Valvular repair ( I believe MV) left prosthetic knee placement presented to the hospital second of October with severe left knee pain. She been visiting her family here in [place] and I developed severe erythema involving her right breast. She then developed severe near knee pain and weakness and apparently fell on Sunday prior to admission. On the day of admission she crawled from one room to reach her phone and called for help. She was then brought to the emerge department she was complaining of chills fatigue and generalized weakness. Her knee had a significant effusion which was aspirated and sent for cell count differential showed 98,000 white blood cells with 90% neutrophils. She had blood cultures obtained well group B strep is growing from both blood and from her knee. 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  Clearly she needs to have surgery on her prosthetic knee but she has not yet been seen by orthopedic surgery.  Per the chart family is now asking for transfer to [place] where the patient lives in [place]. I do recommend that she have surgery here and done promptly to control the source of her infection prior to transfer of care which I would think will be fairly complicated to arrange.  Past Medical History:  Diagnosis Date  . Breast cancer (HCC) 2015  . Coronary artery disease   . Diabetes mellitus without complication (HCC)   . Hypertension     Past Surgical History:  Procedure Laterality Date  . BREAST LUMPECTOMY Right   . CARDIAC SURGERY    . cataract surgery    .  CHOLECYSTECTOMY    . EYE SURGERY      Social History:  reports that she has quit smoking. She has never used smokeless tobacco. She reports that she does not drink alcohol or use drugs.   Family History  Problem Relation Age of Onset  . Heart failure Mother   . Asthma Father     Allergies  Allergen Reactions  . Amoxicillin Swelling  . Penicillins Swelling    Has patient had a PCN reaction causing immediate rash, facial/tongue/throat swelling, SOB or lightheadedness with hypotension: Yes Has patient had a PCN reaction causing severe rash involving mucus membranes or skin necrosis: No Has patient had a PCN reaction that required hospitalization No Has patient had a PCN reaction occurring within the last 10 years: No If all of the above answers are "NO", then may proceed with Cephalosporin use.      Medications: I have reviewed patients current medications as documented in Epic Anti-infectives    Start     Dose/Rate Route Frequency Ordered Stop   06/03/16 1230  ceFAZolin (ANCEF) IVPB 2g/100 mL premix     2 g 200 mL/hr over 30 Minutes Intravenous Every 8 hours 06/03/16 1132     10 /03/17 1800  vancomycin (VANCOCIN) 1,250 mg in sodium chloride 0.9 % 250 mL IVPB  Status:  Discontinued     1,250 mg 166.7 mL/hr over 90 Minutes Intravenous Every 24 hours 06/01/16 1910 06/03/16 1132   06/02/16 0300  aztreonam (AZACTAM) 2  g in dextrose 5 % 50 mL IVPB  Status:  Discontinued     2 g 100 mL/hr over 30 Minutes Intravenous Every 8 hours 06/01/16 1910 06/02/16 1711   06/01/16 1915  vancomycin (VANCOCIN) 1,500 mg in sodium chloride 0.9 % 500 mL IVPB     1,500 mg 250 mL/hr over 120 Minutes Intravenous  Once 06/01/16 1901 06/01/16 2249   06/01/16 1900  aztreonam (AZACTAM) 2 g in dextrose 5 % 50 mL IVPB     2 g 100 mL/hr over 30 Minutes Intravenous  Once 06/01/16 1850 06/01/16 2035   06/01/16 1900  vancomycin (VANCOCIN) IVPB 1000 mg/200 mL premix  Status:  Discontinued     1,000 mg 200 mL/hr  over 60 Minutes Intravenous  Once 06/01/16 1850 06/01/16 1901         ROS:  as in HPI otherwise remainder of 12 point Review of Systems is negative   Blood pressure (!) 165/64, pulse 90, temperature 99.2 F (37.3 C), temperature source Oral, resp. rate (!) 21, height 5' 3"  (1.6 m), weight 182 lb (82.6 kg), SpO2 99 %. General: Alert and awake, oriented x3, not in any acute distress. HEENT: anicteric sclera,  EOMI, oropharynx clear and without exudate Cardiovascular: regular rate, normal r,  no murmur rubs or gallops Pulmonary: clear to auscultation bilaterally, no wheezing, rales or rhonchi Gastrointestinal: soft nontender, nondistended, normal bowel sounds, Musculoskeletal: no  clubbing or edema noted bilaterally Skin,   Right breast edema has resolved 06/03/2016:    Left knee is swollen and warm to touch tender as well 06/03/2016:       Neuro: nonfocal, strength and sensation intact   Results for orders placed or performed during the hospital encounter of 06/01/16 (from the past 48 hour(s))  CBG monitoring, ED     Status: Abnormal   Collection Time: 06/01/16  3:58 PM  Result Value Ref Range   Glucose-Capillary 595 (HH) 65 - 99 mg/dL   Comment 1 Document in Chart   CK     Status: Abnormal   Collection Time: 06/01/16  4:05 PM  Result Value Ref Range   Total CK 457 (H) 38 - 234 U/L  Synovial cell count + diff, w/ crystals     Status: Abnormal   Collection Time: 06/01/16  4:50 PM  Result Value Ref Range   Color, Synovial BROWN (A) YELLOW   Appearance-Synovial TURBID (A) CLEAR   Crystals, Fluid INTRACELLULAR MONOSODIUM URATE CRYSTALS    WBC, Synovial 98,000 (H) 0 - 200 /cu mm   Neutrophil, Synovial 90 (H) 0 - 25 %   Lymphocytes-Synovial Fld 5 0 - 20 %   Monocyte-Macrophage-Synovial Fluid 5 (L) 50 - 90 %   Eosinophils-Synovial 1 0 - 1 %   Other Cells-SYN 0   Gram stain     Status: None   Collection Time: 06/01/16  4:50 PM  Result Value Ref Range   Specimen  Description FLUID SYNOVIAL LEFT KNEE    Special Requests NONE    Gram Stain      ABUNDANT WBC PRESENT, PREDOMINANTLY PMN RARE INTRACELLULAR GRAM POSITIVE COCCI Gram Stain Report Called to,Read Back By and Verified With: EDWARDS,C. AT 1804 ON 06/01/2016 BY BAUGHAM,M.    Report Status 06/01/2016 FINAL   Culture, body fluid-bottle     Status: Abnormal (Preliminary result)   Collection Time: 06/01/16  4:50 PM  Result Value Ref Range   Specimen Description FLUID SYNOVIAL LEFT KNEE    Special Requests BOTTLES DRAWN AEROBIC  AND ANAEROBIC 3CC EACH    Gram Stain      GRAM POSITIVE COCCI IN CHAINS IN BOTH AEROBIC AND ANAEROBIC BOTTLES Gram Stain Report Called to,Read Back By and Verified With: REAP B AT [place] AT 0600 ON [id number] BY FORSYTH K Performed at [facility] STREP(S.AGALACTIAE)ISOLATED (A)    Report Status PENDING   CBG monitoring, ED     Status: Abnormal   Collection Time: 06/01/16  5:06 PM  Result Value Ref Range   Glucose-Capillary 464 (H) 65 - 99 mg/dL  CBG monitoring, ED     Status: Abnormal   Collection Time: 06/01/16  6:01 PM  Result Value Ref Range   Glucose-Capillary 407 (H) 65 - 99 mg/dL  CBG monitoring, ED     Status: Abnormal   Collection Time: 06/01/16  7:07 PM  Result Value Ref Range   Glucose-Capillary 350 (H) 65 - 99 mg/dL  Blood Culture (routine x 2)     Status: Abnormal (Preliminary result)   Collection Time: 06/01/16  7:42 PM  Result Value Ref Range   Specimen Description BLOOD LEFT HAND    Special Requests BOTTLES DRAWN AEROBIC ONLY 6CC    Culture  Setup Time      GRAM POSITIVE COCCI IN CHAINS RECOVERED FROM THE AEROBIC BOTTLE Gram Stain Report Called to,Read Back By and Verified With: AGUIRRE,A. AT 5790 ON 06/02/2016 BY BAUGHAM,M. Performed at [facility] ID to follow CRITICAL RESULT CALLED TO, READ BACK BY AND VERIFIED WITH: G ABBOTT PHARMD 2300 06/02/16 A BROWNING    Culture (A)     GROUP B  STREP(S.AGALACTIAE)ISOLATED SUSCEPTIBILITIES TO FOLLOW Performed at [facility]    Report Status PENDING   Blood Culture ID Panel (Reflexed)     Status: Abnormal   Collection Time: 06/01/16  7:42 PM  Result Value Ref Range   Enterococcus species NOT DETECTED NOT DETECTED   Vancomycin resistance NOT DETECTED NOT DETECTED   Listeria monocytogenes NOT DETECTED NOT DETECTED   Staphylococcus species NOT DETECTED NOT DETECTED   Staphylococcus aureus NOT DETECTED NOT DETECTED   Methicillin resistance NOT DETECTED NOT DETECTED   Streptococcus species DETECTED (A) NOT DETECTED    Comment: CRITICAL RESULT CALLED TO, READ BACK BY AND VERIFIED WITH: G ABBOTT PHARMD 2300 06/02/16 A BROWNING    Streptococcus agalactiae DETECTED (A) NOT DETECTED    Comment: CRITICAL RESULT CALLED TO, READ BACK BY AND VERIFIED WITH: G ABBOTT PHARMD 2300 06/02/16 A BROWNING    Streptococcus pneumoniae NOT DETECTED NOT DETECTED   Streptococcus pyogenes NOT DETECTED NOT DETECTED   Acinetobacter baumannii NOT DETECTED NOT DETECTED   Enterobacteriaceae species NOT DETECTED NOT DETECTED   Enterobacter cloacae complex NOT DETECTED NOT DETECTED   Escherichia coli NOT DETECTED NOT DETECTED   Klebsiella oxytoca NOT DETECTED NOT DETECTED   Klebsiella pneumoniae NOT DETECTED NOT DETECTED   Proteus species NOT DETECTED NOT DETECTED   Serratia marcescens NOT DETECTED NOT DETECTED   Carbapenem resistance NOT DETECTED NOT DETECTED   Haemophilus influenzae NOT DETECTED NOT DETECTED   Neisseria meningitidis NOT DETECTED NOT DETECTED   Pseudomonas aeruginosa NOT DETECTED NOT DETECTED   Candida albicans NOT DETECTED NOT DETECTED   Candida glabrata NOT DETECTED NOT DETECTED   Candida krusei NOT DETECTED NOT DETECTED   Candida parapsilosis NOT DETECTED NOT DETECTED   Candida tropicalis NOT DETECTED NOT DETECTED    Comment: Performed at [facility]  Blood Culture (routine x 2)  Status: None (Preliminary result)     Collection Time: 06/01/16  7:54 PM  Result Value Ref Range   Specimen Description BLOOD LEFT HAND    Special Requests BOTTLES DRAWN AEROBIC ONLY 6CC    Culture NO GROWTH 2 DAYS    Report Status PENDING   Lactic acid, plasma     Status: Abnormal   Collection Time: 06/01/16  7:54 PM  Result Value Ref Range   Lactic Acid, Venous 3.9 (HH) 0.5 - 1.9 mmol/L    Comment: CRITICAL RESULT CALLED TO, READ BACK BY AND VERIFIED WITH: TALBOT T AT 2343 ON [id number] BY FORSYTH K   CBG monitoring, ED     Status: Abnormal   Collection Time: 06/01/16  8:11 PM  Result Value Ref Range   Glucose-Capillary 283 (H) 65 - 99 mg/dL   Comment 1 Notify RN    Comment 2 Document in Chart   I-Stat CG4 Lactic Acid, ED  (not at  [facility])     Status: Abnormal   Collection Time: 06/01/16  8:18 PM  Result Value Ref Range   Lactic Acid, Venous 5.41 (HH) 0.5 - 1.9 mmol/L   Comment NOTIFIED PHYSICIAN   CBG monitoring, ED     Status: Abnormal   Collection Time: 06/01/16  9:18 PM  Result Value Ref Range   Glucose-Capillary 233 (H) 65 - 99 mg/dL   Comment 1 Document in Chart   Urinalysis, Routine w reflex microscopic     Status: Abnormal   Collection Time: 06/01/16 10:01 PM  Result Value Ref Range   Color, Urine YELLOW YELLOW   APPearance HAZY (A) CLEAR   Specific Gravity, Urine 1.025 1.005 - 1.030   pH 5.5 5.0 - 8.0   Glucose, UA >1000 (A) NEGATIVE mg/dL   Hgb urine dipstick MODERATE (A) NEGATIVE   Bilirubin Urine NEGATIVE NEGATIVE   Ketones, ur NEGATIVE NEGATIVE mg/dL   Protein, ur 30 (A) NEGATIVE mg/dL   Nitrite NEGATIVE NEGATIVE   Leukocytes, UA SMALL (A) NEGATIVE  Urine culture     Status: Abnormal (Preliminary result)   Collection Time: 06/01/16 10:01 PM  Result Value Ref Range   Specimen Description URINE, CLEAN CATCH    Special Requests NONE    Culture >=100,000 COLONIES/mL ESCHERICHIA COLI (A)    Report Status PENDING   Urine microscopic-add on     Status: Abnormal   Collection Time: 06/01/16 10:01 PM   Result Value Ref Range   Squamous Epithelial / LPF 0-5 (A) NONE SEEN   WBC, UA TOO NUMEROUS TO COUNT 0 - 5 WBC/hpf   RBC / HPF 0-5 0 - 5 RBC/hpf   Bacteria, UA MANY (A) NONE SEEN  CBC with Differential     Status: Abnormal   Collection Time: 06/01/16 11:06 PM  Result Value Ref Range   WBC 8.9 4.0 - 10.5 K/uL   RBC 4.37 3.87 - 5.11 MIL/uL   Hemoglobin 12.7 12.0 - 15.0 g/dL   HCT 37.0 36.0 - 46.0 %   MCV 84.7 78.0 - 100.0 fL   MCH 29.1 26.0 - 34.0 pg   MCHC 34.3 30.0 - 36.0 g/dL   RDW 15.1 11.5 - 15.5 %   Platelets 121 (L) 150 - 400 K/uL   Neutrophils Relative % 76 %   Neutro Abs 6.8 1.7 - 7.7 K/uL   Lymphocytes Relative 13 %   Lymphs Abs 1.1 0.7 - 4.0 K/uL   Monocytes Relative 11 %   Monocytes Absolute 1.0 0.1 - 1.0 K/uL  Eosinophils Relative 0 %   Eosinophils Absolute 0.0 0.0 - 0.7 K/uL   Basophils Relative 0 %   Basophils Absolute 0.0 0.0 - 0.1 K/uL  Comprehensive metabolic panel     Status: Abnormal   Collection Time: 06/01/16 11:06 PM  Result Value Ref Range   Sodium 132 (L) 135 - 145 mmol/L    Comment: DELTA CHECK NOTED   Potassium 3.5 3.5 - 5.1 mmol/L   Chloride 101 101 - 111 mmol/L   CO2 24 22 - 32 mmol/L   Glucose, Bld 169 (H) 65 - 99 mg/dL   BUN 26 (H) 6 - 20 mg/dL   Creatinine, Ser 0.95 0.44 - 1.00 mg/dL   Calcium 8.1 (L) 8.9 - 10.3 mg/dL   Total Protein 5.4 (L) 6.5 - 8.1 g/dL   Albumin 2.7 (L) 3.5 - 5.0 g/dL   AST 28 15 - 41 U/L   ALT 21 14 - 54 U/L   Alkaline Phosphatase 63 38 - 126 U/L   Total Bilirubin 1.1 0.3 - 1.2 mg/dL   GFR calc non Af Amer 56 (L) >60 mL/min   GFR calc Af Amer >60 >60 mL/min    Comment: (NOTE) The eGFR has been calculated using the CKD EPI equation. This calculation has not been validated in all clinical situations. eGFR's persistently <60 mL/min signify possible Chronic Kidney Disease.    Anion gap 7 5 - 15  Procalcitonin     Status: None   Collection Time: 06/01/16 11:06 PM  Result Value Ref Range   Procalcitonin 47.44  ng/mL    Comment:        Interpretation: PCT >= 10 ng/mL: Important systemic inflammatory response, almost exclusively due to severe bacterial sepsis or septic shock. (NOTE)         ICU PCT Algorithm               Non ICU PCT Algorithm    ----------------------------     ------------------------------         PCT < 0.25 ng/mL                 PCT < 0.1 ng/mL     Stopping of antibiotics            Stopping of antibiotics       strongly encouraged.               strongly encouraged.    ----------------------------     ------------------------------       PCT level decrease by               PCT < 0.25 ng/mL       >= 80% from peak PCT       OR PCT 0.25 - 0.5 ng/mL          Stopping of antibiotics                                             encouraged.     Stopping of antibiotics           encouraged.    ----------------------------     ------------------------------       PCT level decrease by              PCT >= 0.25 ng/mL       < 80% from peak PCT  AND PCT >= 0.5 ng/mL             Continuing antibiotics                                              encouraged.       Continuing antibiotics            encouraged.    ----------------------------     ------------------------------     PCT level increase compared          PCT > 0.5 ng/mL         with peak PCT AND          PCT >= 0.5 ng/mL             Escalation of antibiotics                                          strongly encouraged.      Escalation of antibiotics        strongly encouraged.   Protime-INR     Status: Abnormal   Collection Time: 06/01/16 11:06 PM  Result Value Ref Range   Prothrombin Time 33.1 (H) 11.4 - 15.2 seconds   INR 3.15   APTT     Status: Abnormal   Collection Time: 06/01/16 11:06 PM  Result Value Ref Range   aPTT 59 (H) 24 - 36 seconds    Comment:        IF BASELINE aPTT IS ELEVATED, SUGGEST PATIENT RISK ASSESSMENT BE USED TO DETERMINE APPROPRIATE ANTICOAGULANT THERAPY.   CBG monitoring, ED      Status: Abnormal   Collection Time: 06/01/16 11:14 PM  Result Value Ref Range   Glucose-Capillary 157 (H) 65 - 99 mg/dL  CBG monitoring, ED     Status: Abnormal   Collection Time: 06/02/16 12:15 AM  Result Value Ref Range   Glucose-Capillary 146 (H) 65 - 99 mg/dL   Comment 1 Notify RN    Comment 2 Document in Chart   Glucose, capillary     Status: Abnormal   Collection Time: 06/02/16  1:49 AM  Result Value Ref Range   Glucose-Capillary 175 (H) 65 - 99 mg/dL  MRSA PCR Screening     Status: None   Collection Time: 06/02/16  2:50 AM  Result Value Ref Range   MRSA by PCR NEGATIVE NEGATIVE    Comment:        The GeneXpert MRSA Assay (FDA approved for NASAL specimens only), is one component of a comprehensive MRSA colonization surveillance program. It is not intended to diagnose MRSA infection nor to guide or monitor treatment for MRSA infections.   Glucose, capillary     Status: Abnormal   Collection Time: 06/02/16  2:54 AM  Result Value Ref Range   Glucose-Capillary 194 (H) 65 - 99 mg/dL  Glucose, capillary     Status: Abnormal   Collection Time: 06/02/16  4:07 AM  Result Value Ref Range   Glucose-Capillary 154 (H) 65 - 99 mg/dL  Lactic acid, plasma     Status: Abnormal   Collection Time: 06/02/16  5:03 AM  Result Value Ref Range   Lactic Acid, Venous 2.0 (HH) 0.5 - 1.9 mmol/L    Comment: CRITICAL RESULT CALLED TO,  READ BACK BY AND VERIFIED WITH: REAP B,RN 06/02/16 0536 WAYK   CBC WITH DIFFERENTIAL     Status: Abnormal   Collection Time: 06/02/16  5:04 AM  Result Value Ref Range   WBC 7.5 4.0 - 10.5 K/uL   RBC 4.09 3.87 - 5.11 MIL/uL   Hemoglobin 11.5 (L) 12.0 - 15.0 g/dL   HCT 34.8 (L) 36.0 - 46.0 %   MCV 85.1 78.0 - 100.0 fL   MCH 28.1 26.0 - 34.0 pg   MCHC 33.0 30.0 - 36.0 g/dL   RDW 15.4 11.5 - 15.5 %   Platelets 113 (L) 150 - 400 K/uL    Comment: SPECIMEN CHECKED FOR CLOTS REPEATED TO VERIFY PLATELET COUNT CONFIRMED BY SMEAR    Neutrophils Relative % 73  %   Neutro Abs 5.5 1.7 - 7.7 K/uL   Lymphocytes Relative 16 %   Lymphs Abs 1.2 0.7 - 4.0 K/uL   Monocytes Relative 11 %   Monocytes Absolute 0.8 0.1 - 1.0 K/uL   Eosinophils Relative 1 %   Eosinophils Absolute 0.0 0.0 - 0.7 K/uL   Basophils Relative 0 %   Basophils Absolute 0.0 0.0 - 0.1 K/uL  Comprehensive metabolic panel     Status: Abnormal   Collection Time: 06/02/16  5:04 AM  Result Value Ref Range   Sodium 131 (L) 135 - 145 mmol/L   Potassium 3.3 (L) 3.5 - 5.1 mmol/L   Chloride 100 (L) 101 - 111 mmol/L   CO2 23 22 - 32 mmol/L   Glucose, Bld 141 (H) 65 - 99 mg/dL   BUN 25 (H) 6 - 20 mg/dL   Creatinine, Ser 0.90 0.44 - 1.00 mg/dL   Calcium 8.0 (L) 8.9 - 10.3 mg/dL   Total Protein 4.7 (L) 6.5 - 8.1 g/dL   Albumin 2.3 (L) 3.5 - 5.0 g/dL   AST 23 15 - 41 U/L   ALT 18 14 - 54 U/L   Alkaline Phosphatase 56 38 - 126 U/L   Total Bilirubin 1.2 0.3 - 1.2 mg/dL   GFR calc non Af Amer 60 (L) >60 mL/min   GFR calc Af Amer >60 >60 mL/min    Comment: (NOTE) The eGFR has been calculated using the CKD EPI equation. This calculation has not been validated in all clinical situations. eGFR's persistently <60 mL/min signify possible Chronic Kidney Disease.    Anion gap 8 5 - 15  Magnesium     Status: None   Collection Time: 06/02/16  5:04 AM  Result Value Ref Range   Magnesium 1.9 1.7 - 2.4 mg/dL  Phosphorus     Status: Abnormal   Collection Time: 06/02/16  5:04 AM  Result Value Ref Range   Phosphorus 1.2 (L) 2.5 - 4.6 mg/dL  Glucose, capillary     Status: Abnormal   Collection Time: 06/02/16  5:31 AM  Result Value Ref Range   Glucose-Capillary 117 (H) 65 - 99 mg/dL  Glucose, capillary     Status: Abnormal   Collection Time: 06/02/16  7:00 AM  Result Value Ref Range   Glucose-Capillary 107 (H) 65 - 99 mg/dL  Lactic acid, plasma     Status: None   Collection Time: 06/02/16  7:16 AM  Result Value Ref Range   Lactic Acid, Venous 1.7 0.5 - 1.9 mmol/L  Glucose, capillary      Status: Abnormal   Collection Time: 06/02/16  8:08 AM  Result Value Ref Range   Glucose-Capillary 124 (H) 65 - 99  mg/dL  Glucose, capillary     Status: Abnormal   Collection Time: 06/02/16 12:26 PM  Result Value Ref Range   Glucose-Capillary 155 (H) 65 - 99 mg/dL  Hemoglobin A1c     Status: Abnormal   Collection Time: 06/02/16 12:45 PM  Result Value Ref Range   Hgb A1c MFr Bld 10.4 (H) 4.8 - 5.6 %    Comment: (NOTE)         Pre-diabetes: 5.7 - 6.4         Diabetes: >6.4         Glycemic control for adults with diabetes: <7.0    Mean Plasma Glucose 252 mg/dL    Comment: (NOTE) Performed At: [facility] [place], [place] [id number] Lindon Romp MD [id number]   Glucose, capillary     Status: Abnormal   Collection Time: 06/02/16  6:08 PM  Result Value Ref Range   Glucose-Capillary 239 (H) 65 - 99 mg/dL  Culture, blood (Routine X 2) w Reflex to ID Panel     Status: None (Preliminary result)   Collection Time: 06/02/16  6:55 PM  Result Value Ref Range   Specimen Description BLOOD LEFT HAND    Special Requests BOTTLES DRAWN AEROBIC ONLY 5CC    Culture NO GROWTH < 24 HOURS    Report Status PENDING   Culture, blood (Routine X 2) w Reflex to ID Panel     Status: None (Preliminary result)   Collection Time: 06/02/16  7:05 PM  Result Value Ref Range   Specimen Description BLOOD LEFT ANTECUBITAL    Special Requests BOTTLES DRAWN AEROBIC AND ANAEROBIC 10 CC EA    Culture NO GROWTH < 24 HOURS    Report Status PENDING   Glucose, capillary     Status: Abnormal   Collection Time: 06/02/16  8:25 PM  Result Value Ref Range   Glucose-Capillary 286 (H) 65 - 99 mg/dL  CBC WITH DIFFERENTIAL     Status: Abnormal   Collection Time: 06/03/16  2:12 AM  Result Value Ref Range   WBC 6.1 4.0 - 10.5 K/uL   RBC 3.79 (L) 3.87 - 5.11 MIL/uL   Hemoglobin 10.8 (L) 12.0 - 15.0 g/dL   HCT 32.7 (L) 36.0 - 46.0 %   MCV 86.3 78.0 - 100.0 fL   MCH 28.5 26.0 - 34.0 pg   MCHC  33.0 30.0 - 36.0 g/dL   RDW 15.2 11.5 - 15.5 %   Platelets 114 (L) 150 - 400 K/uL    Comment: CONSISTENT WITH PREVIOUS RESULT   Neutrophils Relative % 73 %   Neutro Abs 4.5 1.7 - 7.7 K/uL   Lymphocytes Relative 12 %   Lymphs Abs 0.8 0.7 - 4.0 K/uL   Monocytes Relative 14 %   Monocytes Absolute 0.8 0.1 - 1.0 K/uL   Eosinophils Relative 1 %   Eosinophils Absolute 0.0 0.0 - 0.7 K/uL   Basophils Relative 0 %   Basophils Absolute 0.0 0.0 - 0.1 K/uL  Comprehensive metabolic panel     Status: Abnormal   Collection Time: 06/03/16  2:12 AM  Result Value Ref Range   Sodium 132 (L) 135 - 145 mmol/L   Potassium 3.0 (L) 3.5 - 5.1 mmol/L   Chloride 103 101 - 111 mmol/L   CO2 22 22 - 32 mmol/L   Glucose, Bld 212 (H) 65 - 99 mg/dL   BUN 16 6 - 20 mg/dL   Creatinine, Ser 0.67 0.44 - 1.00 mg/dL  Calcium 7.7 (L) 8.9 - 10.3 mg/dL   Total Protein 4.7 (L) 6.5 - 8.1 g/dL   Albumin 2.1 (L) 3.5 - 5.0 g/dL   AST 17 15 - 41 U/L   ALT 17 14 - 54 U/L   Alkaline Phosphatase 55 38 - 126 U/L   Total Bilirubin 1.3 (H) 0.3 - 1.2 mg/dL   GFR calc non Af Amer >60 >60 mL/min   GFR calc Af Amer >60 >60 mL/min    Comment: (NOTE) The eGFR has been calculated using the CKD EPI equation. This calculation has not been validated in all clinical situations. eGFR's persistently <60 mL/min signify possible Chronic Kidney Disease.    Anion gap 7 5 - 15  Phosphorus     Status: Abnormal   Collection Time: 06/03/16  2:12 AM  Result Value Ref Range   Phosphorus 1.8 (L) 2.5 - 4.6 mg/dL  Magnesium     Status: Abnormal   Collection Time: 06/03/16  2:12 AM  Result Value Ref Range   Magnesium 1.6 (L) 1.7 - 2.4 mg/dL  Protime-INR     Status: Abnormal   Collection Time: 06/03/16  2:12 AM  Result Value Ref Range   Prothrombin Time 24.6 (H) 11.4 - 15.2 seconds   INR 2.18   Glucose, capillary     Status: Abnormal   Collection Time: 06/03/16  7:46 AM  Result Value Ref Range   Glucose-Capillary 198 (H) 65 - 99 mg/dL    Glucose, capillary     Status: Abnormal   Collection Time: 06/03/16 12:34 PM  Result Value Ref Range   Glucose-Capillary 244 (H) 65 - 99 mg/dL   @BRIEFLABTABLE (sdes,specrequest,cult,reptstatus)   ) Recent Results (from the past 720 hour(s))  Gram stain     Status: None   Collection Time: 06/01/16  4:50 PM  Result Value Ref Range Status   Specimen Description FLUID SYNOVIAL LEFT KNEE  Final   Special Requests NONE  Final   Gram Stain   Final    ABUNDANT WBC PRESENT, PREDOMINANTLY PMN RARE INTRACELLULAR GRAM POSITIVE COCCI Gram Stain Report Called to,Read Back By and Verified With: EDWARDS,C. AT 1804 ON 06/01/2016 BY BAUGHAM,M.    Report Status 06/01/2016 FINAL  Final  Culture, body fluid-bottle     Status: Abnormal (Preliminary result)   Collection Time: 06/01/16  4:50 PM  Result Value Ref Range Status   Specimen Description FLUID SYNOVIAL LEFT KNEE  Final   Special Requests BOTTLES DRAWN AEROBIC AND ANAEROBIC 3CC EACH  Final   Gram Stain   Final    GRAM POSITIVE COCCI IN CHAINS IN BOTH AEROBIC AND ANAEROBIC BOTTLES Gram Stain Report Called to,Read Back By and Verified With: REAP B AT [place] AT 0600 ON [id number] BY FORSYTH K Performed at [facility] STREP(S.AGALACTIAE)ISOLATED (A)  Final   Report Status PENDING  Incomplete  Blood Culture (routine x 2)     Status: Abnormal (Preliminary result)   Collection Time: 06/01/16  7:42 PM  Result Value Ref Range Status   Specimen Description BLOOD LEFT HAND  Final   Special Requests BOTTLES DRAWN AEROBIC ONLY [place]  Final   Culture  Setup Time   Final    GRAM POSITIVE COCCI IN CHAINS RECOVERED FROM THE AEROBIC BOTTLE Gram Stain Report Called to,Read Back By and Verified With: AGUIRRE,A. AT 3419 ON 06/02/2016 BY BAUGHAM,M. Performed at [place] ID to follow CRITICAL RESULT CALLED TO, READ BACK BY AND VERIFIED WITH:  Denton Brick PHARMD 2300 06/02/16 A BROWNING    Culture (A)  Final    GROUP B  STREP(S.AGALACTIAE)ISOLATED SUSCEPTIBILITIES TO FOLLOW Performed at [facility]    Report Status PENDING  Incomplete  Blood Culture ID Panel (Reflexed)     Status: Abnormal   Collection Time: 06/01/16  7:42 PM  Result Value Ref Range Status   Enterococcus species NOT DETECTED NOT DETECTED Final   Vancomycin resistance NOT DETECTED NOT DETECTED Final   Listeria monocytogenes NOT DETECTED NOT DETECTED Final   Staphylococcus species NOT DETECTED NOT DETECTED Final   Staphylococcus aureus NOT DETECTED NOT DETECTED Final   Methicillin resistance NOT DETECTED NOT DETECTED Final   Streptococcus species DETECTED (A) NOT DETECTED Final    Comment: CRITICAL RESULT CALLED TO, READ BACK BY AND VERIFIED WITH: G ABBOTT PHARMD 2300 06/02/16 A BROWNING    Streptococcus agalactiae DETECTED (A) NOT DETECTED Final    Comment: CRITICAL RESULT CALLED TO, READ BACK BY AND VERIFIED WITH: G ABBOTT PHARMD 2300 06/02/16 A BROWNING    Streptococcus pneumoniae NOT DETECTED NOT DETECTED Final   Streptococcus pyogenes NOT DETECTED NOT DETECTED Final   Acinetobacter baumannii NOT DETECTED NOT DETECTED Final   Enterobacteriaceae species NOT DETECTED NOT DETECTED Final   Enterobacter cloacae complex NOT DETECTED NOT DETECTED Final   Escherichia coli NOT DETECTED NOT DETECTED Final   Klebsiella oxytoca NOT DETECTED NOT DETECTED Final   Klebsiella pneumoniae NOT DETECTED NOT DETECTED Final   Proteus species NOT DETECTED NOT DETECTED Final   Serratia marcescens NOT DETECTED NOT DETECTED Final   Carbapenem resistance NOT DETECTED NOT DETECTED Final   Haemophilus influenzae NOT DETECTED NOT DETECTED Final   Neisseria meningitidis NOT DETECTED NOT DETECTED Final   Pseudomonas aeruginosa NOT DETECTED NOT DETECTED Final   Candida albicans NOT DETECTED NOT DETECTED Final   Candida glabrata NOT DETECTED NOT DETECTED Final   Candida krusei NOT DETECTED NOT DETECTED Final   Candida parapsilosis NOT DETECTED NOT  DETECTED Final   Candida tropicalis NOT DETECTED NOT DETECTED Final    Comment: Performed at [facility]  Blood Culture (routine x 2)     Status: None (Preliminary result)   Collection Time: 06/01/16  7:54 PM  Result Value Ref Range Status   Specimen Description BLOOD LEFT HAND  Final   Special Requests BOTTLES DRAWN AEROBIC ONLY 6CC  Final   Culture NO GROWTH 2 DAYS  Final   Report Status PENDING  Incomplete  Urine culture     Status: Abnormal (Preliminary result)   Collection Time: 06/01/16 10:01 PM  Result Value Ref Range Status   Specimen Description URINE, CLEAN CATCH  Final   Special Requests NONE  Final   Culture >=100,000 COLONIES/mL ESCHERICHIA COLI (A)  Final   Report Status PENDING  Incomplete  MRSA PCR Screening     Status: None   Collection Time: 06/02/16  2:50 AM  Result Value Ref Range Status   MRSA by PCR NEGATIVE NEGATIVE Final    Comment:        The GeneXpert MRSA Assay (FDA approved for NASAL specimens only), is one component of a comprehensive MRSA colonization surveillance program. It is not intended to diagnose MRSA infection nor to guide or monitor treatment for MRSA infections.   Culture, blood (Routine X 2) w Reflex to ID Panel     Status: None (Preliminary result)   Collection Time: 06/02/16  6:55 PM  Result Value Ref Range Status   Specimen Description  BLOOD LEFT HAND  Final   Special Requests BOTTLES DRAWN AEROBIC ONLY 5CC  Final   Culture NO GROWTH < 24 HOURS  Final   Report Status PENDING  Incomplete  Culture, blood (Routine X 2) w Reflex to ID Panel     Status: None (Preliminary result)   Collection Time: 06/02/16  7:05 PM  Result Value Ref Range Status   Specimen Description BLOOD LEFT ANTECUBITAL  Final   Special Requests BOTTLES DRAWN AEROBIC AND ANAEROBIC 10 CC EA  Final   Culture NO GROWTH < 24 HOURS  Final   Report Status PENDING  Incomplete     Impression/Recommendation  Principal Problem:   Septic arthritis of knee,  left (HCC) Active Problems:   Uncontrolled type 2 diabetes mellitus ([place])   CAD (coronary artery disease)   Hypertension   UTI (urinary tract infection)   Hypophosphatemia   History of breast cancer   Sepsis ([place])   Cellulitis of right breast   Toni Hanna is a 80 y.o. female with hx of valvular heart surgery, MAZE for atrial fibrillation, TKA on the left, lumpectomy and axillary LN dissection for breast cancer admitted after flare of mastitis followed by Left PJI and bacteremia with GBS  #1 GBS bacteremia and septic PJI:  --repeat blood cultures taken --she is tolerating ancef 2g IV q 8 hours at present --will change to rocephin 2g IV daily --she has had TTE which shows hypokinesis with overall moderate to severe LV dysfunction; s/p   MV repair with mild MR; biatrial enlargement; moderate TR with   severely elevated pulmonary pressure  She needs to be seen by Orthopedic surgery for surgery; most successful surgery would be 2 staged procedure next most would be single staged exchange.   The patient says her knee is in very good position so she is hesitant to have it exhanged  Least successful would be I and D and washout  I think she also clearly needs to be seen by Cardiology given severe dysfunction on Echo  I would also like a TEE to evaluate for endocarditis  #2 Mastitis: seems resolved  #3 Screening: will screen for HIV, HCV     06/03/2016, 3:52 PM   Thank you so much for this interesting consult  [place] for [place] [phone] (pager) [phone] (office) 06/03/2016, 3:52 PM  [place] 06/03/2016, 3:52 PM

## 2016-06-03 NOTE — Progress Notes (Signed)
PROGRESS NOTE    SIMEON CUTTINO  [id number] DOB: 1936-05-24 DOA: 06/01/2016 PCP: Robert Bellow, MD   Chief Complaint  Patient presents with  . Knee Pain  . Hyperglycemia     Brief Narrative:  HPI on 06/01/2016 by Dr. Jackquline Berlin a 80 y.o.femalewith medical history significant of breast cancer, history of right breast lumpectomy, CAD, type 2 diabetes, hypertension who is coming to the emergency department due to left knee pain. Per patient, she started having left knee pain on Friday which continuedto get worse through the weekend. She had a left knee arthroplasty in [place] about 10 years ago. She states that she fell on the floor on Sunday evening due to weakness and was unable to get up from the floor, so she decided to go to sleep. This morning, when she woke up, she crawled to the other room to reach her phone, madea phone call for help and was subsequently brought to the emergency department. She estimates that she spent about 12 hours on the floor. She denies fever, but complains of chills, fatigue, generalized weakness, decreased appetite. Dr. Miller from the emergency department and spoke to Dr. Swintek(orthopedic surgery) who suggested transfer to [facility] for further evaluation and treatment.   Assessment & Plan   Sepsis secondary to septic arthritis left knee -Upon admission, patient had mild leukocytosis, elevated lactic acid, soft BP.  She has been tachycardic and tachypneic.  -Fluid culture GPC in chains, culture pending -Blood cultures from 06/01/2016 show Strep agalactaie in 1/2 bottles -Currently on Aztreonam, vanco  -Repeat Blood cultures from 06/02/2016 pending -ID consulted by previous hospitalist.  Echo pending  -Orthopedics consulted and appreciated  Sepsis secondary to right breast cellulitis and ?UTI -Continue current IV antibiotic treatment as above -Urine culture pending  -Erythema of the right breast appears to be  improving  Uncontrolled type 2 diabetes mellitus -Hold metformin, glipizide  -emoglobin A1c 10.4 -Continue Lantus, ISS, CBG monitoring   Hypokalemia/ hypomagnesemia -Replaced, continue to monitor   Hypophosphatemia -Resolved with replacement  A Fib on coumadin -CHADSVASC 5 (age, gender, DM, HTN) -Rate controlled -Metoprolol held due to low BP  -Pharmacy to adjust coumadin dosage -INR 2.18  Hypertension -Metoprolol held due to low BP   Lactic acidosis -Resolved with IVF   History of breast cancer -Continue Arimidex 1 mg by mouth daily  DVT Prophylaxis  Coumadin  Code Status: Full  Family Communication: None at bedside  Disposition Plan: Admitted  Consultants Orthopedics Infectious disease, via phone, by Dr. Choi  Procedures  None  Antibiotics   Anti-infectives    Start     Dose/Rate Route Frequency Ordered Stop   06/02/16 1800  vancomycin (VANCOCIN) 1,250 mg in sodium chloride 0.9 % 250 mL IVPB     1,250 mg 166.7 mL/hr over 90 Minutes Intravenous Every 24 hours 06/01/16 1910     10 /03/17 0300  aztreonam (AZACTAM) 2 g in dextrose 5 % 50 mL IVPB  Status:  Discontinued     2 g 100 mL/hr over 30 Minutes Intravenous Every 8 hours 06/01/16 1910 06/02/16 1711   06/01/16 1915  vancomycin (VANCOCIN) 1,500 mg in sodium chloride 0.9 % 500 mL IVPB     1,500 mg 250 mL/hr over 120 Minutes Intravenous  Once 06/01/16 1901 06/01/16 2249   06/01/16 1900  aztreonam (AZACTAM) 2 g in dextrose 5 % 50 mL IVPB     2 g 100 mL/hr over 30 Minutes Intravenous  Once  06/01/16 1850 06/01/16 2035   06/01/16 1900  vancomycin (VANCOCIN) IVPB 1000 mg/200 mL premix  Status:  Discontinued     1,000 mg 200 mL/hr over 60 Minutes Intravenous  Once 06/01/16 1850 06/01/16 1901      Subjective:   Arthella Vernet seen and examined today.  Patient complains of some knee pain.  Denies chest pain, shortness of breath, abdominal pain, nausea, vomiting, diarrhea, constipation.  Denies any pain in  her right breast or swelling.   Objective:   Vitals:   06/03/16 0500 06/03/16 0600 06/03/16 0741 06/03/16 0801  BP: 135/63 (!) 133/56 128/87   Pulse: 93 94 (!) 49   Resp: (!) 28 18  (!) 32  Temp:   98.7 F (37.1 C)   TempSrc:   Axillary   SpO2: 100% 97% 99%   Weight:      Height:        Intake/Output Summary (Last 24 hours) at 06/03/16 1010 Last data filed at 06/03/16 0845  Gross per 24 hour  Intake             2980 ml  Output                6 ml  Net             2974 ml   Filed Weights   06/01/16 1509  Weight: 82.6 kg (182 lb)    Exam  General: Well developed, well nourished, NAD, appears stated age  HEENT: NCAT,  mucous membranes moist.   Neck: Supple, no JVD, no masses  Cardiovascular: S1 S2 auscultated, 2/6 SEM, RRR  Respiratory: Clear to auscultation bilaterally with equal chest rise  Abdomen: Soft, nontender, nondistended, + bowel sounds  Extremities: warm dry without cyanosis clubbing or edema. Left knee swelling, mild pain with ROM  Neuro: AAOx3, nonfocal  Skin: Small area of erythema on right breast  Psych: Normal affect and demeanor    Data Reviewed: I have personally reviewed following labs and imaging studies  CBC:  Recent Labs Lab 06/01/16 1551 06/01/16 2306 06/02/16 0504 06/03/16 0212  WBC 11.5* 8.9 7.5 6.1  NEUTROABS 9.0* 6.8 5.5 4.5  HGB 14.2 12.7 11.5* 10.8*  HCT 41.1 37.0 34.8* 32.7*  MCV 85.3 84.7 85.1 86.3  PLT 115* 121* 113* [id number]*   Basic Metabolic Panel:  Recent Labs Lab 06/01/16 1551 06/01/16 2306 06/02/16 0504 06/03/16 0212  NA 124* 132* 131* 132*  K 4.1 3.5 3.3* 3.0*  CL 90* 101 100* 103  CO2 21* 24 23 22   GLUCOSE 559* 169* 141* 212*  BUN 25* 26* 25* 16  CREATININE 1.35* 0.95 0.90 0.67  CALCIUM 8.8* 8.1* 8.0* 7.7*  MG 1.9  --  1.9 1.6*  PHOS 1.9*  --  1.2* 1.8*   GFR: Estimated Creatinine Clearance: 59 mL/min (by C-G formula based on SCr of 0.67 mg/dL). Liver Function Tests:  Recent Labs Lab  06/01/16 2306 06/02/16 0504 06/03/16 0212  AST 28 23 17   ALT 21 18 17   ALKPHOS 63 56 55  BILITOT 1.1 1.2 1.3*  PROT 5.4* 4.7* 4.7*  ALBUMIN 2.7* 2.3* 2.1*   No results for input(s): LIPASE, AMYLASE in the last 168 hours. No results for input(s): AMMONIA in the last 168 hours. Coagulation Profile:  Recent Labs Lab 06/01/16 2306 06/03/16 0212  INR 3.15 2.18   Cardiac Enzymes:  Recent Labs Lab 06/01/16 1605  CKTOTAL 457*   BNP (last 3 results) No results for input(s): PROBNP in the  last 8760 hours. HbA1C:  Recent Labs  06/02/16 1245  HGBA1C 10.4*   CBG:  Recent Labs Lab 06/02/16 0808 06/02/16 1226 06/02/16 1808 06/02/16 2025 06/03/16 0746  GLUCAP 124* 155* 239* 286* 198*   Lipid Profile: No results for input(s): CHOL, HDL, LDLCALC, TRIG, CHOLHDL, LDLDIRECT in the last 72 hours. Thyroid Function Tests: No results for input(s): TSH, T4TOTAL, FREET4, T3FREE, THYROIDAB in the last 72 hours. Anemia Panel: No results for input(s): VITAMINB12, FOLATE, FERRITIN, TIBC, IRON, RETICCTPCT in the last 72 hours. Urine analysis:    Component Value Date/Time   COLORURINE YELLOW 06/01/2016 2201   APPEARANCEUR HAZY (A) 06/01/2016 2201   LABSPEC 1.025 06/01/2016 2201   PHURINE 5.5 06/01/2016 2201   GLUCOSEU >1000 (A) 06/01/2016 2201   HGBUR MODERATE (A) 06/01/2016 2201   BILIRUBINUR NEGATIVE 06/01/2016 2201   KETONESUR NEGATIVE 06/01/2016 2201   PROTEINUR 30 (A) 06/01/2016 2201   NITRITE NEGATIVE 06/01/2016 2201   LEUKOCYTESUR SMALL (A) 06/01/2016 2201   Sepsis Labs: @LABRCNTIP (procalcitonin:4,lacticidven:4)  ) Recent Results (from the past 240 hour(s))  Gram stain     Status: None   Collection Time: 06/01/16  4:50 PM  Result Value Ref Range Status   Specimen Description FLUID SYNOVIAL LEFT KNEE  Final   Special Requests NONE  Final   Gram Stain   Final    ABUNDANT WBC PRESENT, PREDOMINANTLY PMN RARE INTRACELLULAR GRAM POSITIVE COCCI Gram Stain Report Called  to,Read Back By and Verified With: EDWARDS,C. AT 1804 ON 06/01/2016 BY BAUGHAM,M.    Report Status 06/01/2016 FINAL  Final  Culture, body fluid-bottle     Status: None (Preliminary result)   Collection Time: 06/01/16  4:50 PM  Result Value Ref Range Status   Specimen Description FLUID SYNOVIAL LEFT KNEE  Final   Special Requests BOTTLES DRAWN AEROBIC AND ANAEROBIC 3CC EACH  Final   Gram Stain   Final    GRAM POSITIVE COCCI IN CHAINS AEB AND ANA BOTTLES Gram Stain Report Called to,Read Back By and Verified With: REAP B AT [place] AT 0600 ON [id number] BY FORSYTH K    Culture   Final    CULTURE REINCUBATED FOR BETTER GROWTH Performed at [facility]    Report Status PENDING  Incomplete  Blood Culture (routine x 2)     Status: Abnormal (Preliminary result)   Collection Time: 06/01/16  7:42 PM  Result Value Ref Range Status   Specimen Description BLOOD LEFT HAND  Final   Special Requests BOTTLES DRAWN AEROBIC ONLY [place]  Final   Culture  Setup Time   Final    GRAM POSITIVE COCCI IN CHAINS RECOVERED FROM THE AEROBIC BOTTLE Gram Stain Report Called to,Read Back By and Verified With: AGUIRRE,A. AT [id number] ON 06/02/2016 BY BAUGHAM,M. Performed at [place] ID to follow CRITICAL RESULT CALLED TO, READ BACK BY AND VERIFIED WITH: G ABBOTT PHARMD 2300 06/02/16 A BROWNING    Culture (A)  Final    GROUP B STREP(S.AGALACTIAE)ISOLATED SUSCEPTIBILITIES TO FOLLOW Performed at [facility]    Report Status PENDING  Incomplete  Blood Culture ID Panel (Reflexed)     Status: Abnormal   Collection Time: 06/01/16  7:42 PM  Result Value Ref Range Status   Enterococcus species NOT DETECTED NOT DETECTED Final   Vancomycin resistance NOT DETECTED NOT DETECTED Final   Listeria monocytogenes NOT DETECTED NOT DETECTED Final   Staphylococcus species NOT DETECTED NOT DETECTED Final   Staphylococcus aureus NOT DETECTED NOT DETECTED Final  Methicillin resistance NOT DETECTED NOT DETECTED  Final   Streptococcus species DETECTED (A) NOT DETECTED Final    Comment: CRITICAL RESULT CALLED TO, READ BACK BY AND VERIFIED WITH: G ABBOTT PHARMD 2300 06/02/16 A BROWNING    Streptococcus agalactiae DETECTED (A) NOT DETECTED Final    Comment: CRITICAL RESULT CALLED TO, READ BACK BY AND VERIFIED WITH: G ABBOTT PHARMD 2300 06/02/16 A BROWNING    Streptococcus pneumoniae NOT DETECTED NOT DETECTED Final   Streptococcus pyogenes NOT DETECTED NOT DETECTED Final   Acinetobacter baumannii NOT DETECTED NOT DETECTED Final   Enterobacteriaceae species NOT DETECTED NOT DETECTED Final   Enterobacter cloacae complex NOT DETECTED NOT DETECTED Final   Escherichia coli NOT DETECTED NOT DETECTED Final   Klebsiella oxytoca NOT DETECTED NOT DETECTED Final   Klebsiella pneumoniae NOT DETECTED NOT DETECTED Final   Proteus species NOT DETECTED NOT DETECTED Final   Serratia marcescens NOT DETECTED NOT DETECTED Final   Carbapenem resistance NOT DETECTED NOT DETECTED Final   Haemophilus influenzae NOT DETECTED NOT DETECTED Final   Neisseria meningitidis NOT DETECTED NOT DETECTED Final   Pseudomonas aeruginosa NOT DETECTED NOT DETECTED Final   Candida albicans NOT DETECTED NOT DETECTED Final   Candida glabrata NOT DETECTED NOT DETECTED Final   Candida krusei NOT DETECTED NOT DETECTED Final   Candida parapsilosis NOT DETECTED NOT DETECTED Final   Candida tropicalis NOT DETECTED NOT DETECTED Final    Comment: Performed at [facility]  Blood Culture (routine x 2)     Status: None (Preliminary result)   Collection Time: 06/01/16  7:54 PM  Result Value Ref Range Status   Specimen Description BLOOD LEFT HAND  Final   Special Requests BOTTLES DRAWN AEROBIC ONLY [place]  Final   Culture NO GROWTH 2 DAYS  Final   Report Status PENDING  Incomplete  MRSA PCR Screening     Status: None   Collection Time: 06/02/16  2:50 AM  Result Value Ref Range Status   MRSA by PCR NEGATIVE NEGATIVE Final    Comment:         The GeneXpert MRSA Assay (FDA approved for NASAL specimens only), is one component of a comprehensive MRSA colonization surveillance program. It is not intended to diagnose MRSA infection nor to guide or monitor treatment for MRSA infections.       Radiology Studies: Dg Chest Port 1 View  Result Date: 06/01/2016 CLINICAL DATA:  Sepsis. EXAM: PORTABLE CHEST 1 VIEW COMPARISON:  None. FINDINGS: Borderline enlarged cardiac silhouette. Median sternotomy wires and prosthetic heart valve. Linear density in both lower lung zones. Surgical clips overlying the right mid lung zone. Unremarkable bones. IMPRESSION: Bilateral lower lung zone linear atelectasis or scarring. Electronically Signed   By: Claudie Revering M.D.   On: 06/01/2016 19:50   Dg Knee Left Port  Result Date: 06/01/2016 CLINICAL DATA:  Pain and swelling since Saturday.  No known injury. EXAM: PORTABLE LEFT KNEE - 1-2 VIEW COMPARISON:  None. FINDINGS: The left knee demonstrates a total knee arthroplasty without evidence of hardware failure complication. There is a small joint effusion. There is no fracture or dislocation. The alignment is anatomic. IMPRESSION: Left total knee arthroplasty without failure or complication. Electronically Signed   By: Kathreen Devoid   On: 06/01/2016 16:22     Scheduled Meds: . anastrozole  1 mg Oral Daily  . dronedarone  400 mg Oral BID WC  . Influenza vac split quadrivalent PF  0.5 mL Intramuscular Tomorrow-1000  . insulin aspart  0-5 Units Subcutaneous QHS  . insulin aspart  0-9 Units Subcutaneous TID WC  . insulin glargine  16 Units Subcutaneous QHS  . oxybutynin  10 mg Oral QHS  . pantoprazole  40 mg Oral Daily  . sodium chloride flush  3 mL Intravenous Q12H  . vancomycin  1,250 mg Intravenous Q24H  . Warfarin - Pharmacist Dosing Inpatient   Does not apply q1800   Continuous Infusions: . sodium chloride 125 mL/hr at 06/02/16 1831     LOS: 2 days   Time Spent in minutes   30  minutes  Merlin Golden D.O. on 06/03/2016 at 10:10 AM  Between 7am to 7pm - Pager - [phone]  After 7pm go to [email or web address] - password [email or web address]  And look for the night coverage person covering for me after hours  [facility] Office  [phone]

## 2016-06-03 NOTE — Progress Notes (Signed)
  Echocardiogram 2D Echocardiogram with Definity has been performed.  Toni Hanna 06/03/2016, 1:50 PM

## 2016-06-04 ENCOUNTER — Inpatient Hospital Stay (HOSPITAL_COMMUNITY): Payer: Medicare Other

## 2016-06-04 ENCOUNTER — Encounter (HOSPITAL_COMMUNITY): Payer: Self-pay | Admitting: *Deleted

## 2016-06-04 ENCOUNTER — Encounter (HOSPITAL_COMMUNITY)
Admission: EM | Disposition: A | Discharge status: Skilled Nursing Facility | Payer: Self-pay | Source: Home / Self Care | Attending: Internal Medicine

## 2016-06-04 DIAGNOSIS — I330 Acute and subacute infective endocarditis: Secondary | ICD-10-CM

## 2016-06-04 DIAGNOSIS — I340 Nonrheumatic mitral (valve) insufficiency: Secondary | ICD-10-CM

## 2016-06-04 DIAGNOSIS — T8450XD Infection and inflammatory reaction due to unspecified internal joint prosthesis, subsequent encounter: Secondary | ICD-10-CM

## 2016-06-04 DIAGNOSIS — A491 Streptococcal infection, unspecified site: Secondary | ICD-10-CM

## 2016-06-04 DIAGNOSIS — I2510 Atherosclerotic heart disease of native coronary artery without angina pectoris: Secondary | ICD-10-CM

## 2016-06-04 DIAGNOSIS — A401 Sepsis due to streptococcus, group B: Secondary | ICD-10-CM

## 2016-06-04 HISTORY — PX: TEE WITHOUT CARDIOVERSION: SHX5443

## 2016-06-04 LAB — HEPARIN LEVEL (UNFRACTIONATED)
HEPARIN UNFRACTIONATED: 0.11 [IU]/mL — AB (ref 0.30–0.70)
Heparin Unfractionated: <0.1 IU/mL — ABNORMAL LOW (ref 0.30–0.70)

## 2016-06-04 LAB — CBC WITH DIFFERENTIAL/PLATELET
Basophils Absolute: 0 10*3/uL (ref 0.0–0.1)
Basophils Relative: 0 %
EOS ABS: 0.1 10*3/uL (ref 0.0–0.7)
Eosinophils Relative: 2 %
HCT: 32.5 % — ABNORMAL LOW (ref 36.0–46.0)
HEMOGLOBIN: 10.4 g/dL — AB (ref 12.0–15.0)
LYMPHS ABS: 1.1 10*3/uL (ref 0.7–4.0)
LYMPHS PCT: 15 %
MCH: 27.8 pg (ref 26.0–34.0)
MCHC: 32 g/dL (ref 30.0–36.0)
MCV: 86.9 fL (ref 78.0–100.0)
Monocytes Absolute: 0.8 10*3/uL (ref 0.1–1.0)
Monocytes Relative: 12 %
NEUTROS PCT: 71 %
Neutro Abs: 5.1 10*3/uL (ref 1.7–7.7)
Platelets: 128 10*3/uL — ABNORMAL LOW (ref 150–400)
RBC: 3.74 MIL/uL — AB (ref 3.87–5.11)
RDW: 15.2 % (ref 11.5–15.5)
WBC: 7.2 10*3/uL (ref 4.0–10.5)

## 2016-06-04 LAB — URINE CULTURE

## 2016-06-04 LAB — CULTURE, BLOOD (ROUTINE X 2)

## 2016-06-04 LAB — GLUCOSE, CAPILLARY
GLUCOSE-CAPILLARY: 163 mg/dL — AB (ref 65–99)
GLUCOSE-CAPILLARY: 232 mg/dL — AB (ref 65–99)
Glucose-Capillary: 171 mg/dL — ABNORMAL HIGH (ref 65–99)
Glucose-Capillary: 162 mg/dL — ABNORMAL HIGH (ref 65–99)
Glucose-Capillary: 236 mg/dL — ABNORMAL HIGH (ref 65–99)
Glucose-Capillary: 214 mg/dL — ABNORMAL HIGH (ref 65–99)

## 2016-06-04 LAB — COMPREHENSIVE METABOLIC PANEL
ALK PHOS: 79 U/L (ref 38–126)
ALT: 19 U/L (ref 14–54)
AST: 22 U/L (ref 15–41)
Albumin: 2.1 g/dL — ABNORMAL LOW (ref 3.5–5.0)
Anion gap: 10 (ref 5–15)
BUN: 8 mg/dL (ref 6–20)
CALCIUM: 8 mg/dL — AB (ref 8.9–10.3)
CO2: 22 mmol/L (ref 22–32)
CREATININE: 0.58 mg/dL (ref 0.44–1.00)
Chloride: 101 mmol/L (ref 101–111)
GFR calc non Af Amer: >60 mL/min (ref >60)
GLUCOSE: 199 mg/dL — AB (ref 65–99)
Potassium: 3.3 mmol/L — ABNORMAL LOW (ref 3.5–5.1)
SODIUM: 133 mmol/L — AB (ref 135–145)
Total Bilirubin: 0.8 mg/dL (ref 0.3–1.2)
Total Protein: 4.9 g/dL — ABNORMAL LOW (ref 6.5–8.1)

## 2016-06-04 LAB — CULTURE, BODY FLUID W GRAM STAIN -BOTTLE

## 2016-06-04 LAB — ECHO TEE
CHL CUP MV M VEL: 100
MVANNULUSVTI: 30.3 cm
MVG: 5 mmHg
Reg peak vel: 374 cm/s
TRMAXVEL: 374 cm/s

## 2016-06-04 LAB — PROTIME-INR
INR: 2
Prothrombin Time: 23 seconds — ABNORMAL HIGH (ref 11.4–15.2)

## 2016-06-04 LAB — MAGNESIUM: Magnesium: 1.8 mg/dL (ref 1.7–2.4)

## 2016-06-04 SURGERY — ECHOCARDIOGRAM, TRANSESOPHAGEAL
Anesthesia: Moderate Sedation

## 2016-06-04 MED ORDER — CHLORHEXIDINE GLUCONATE 4 % EX LIQD
60.0000 mL | Freq: Once | CUTANEOUS | Status: DC
  Filled 2016-06-04: qty 60

## 2016-06-04 MED ORDER — MIDAZOLAM HCL 10 MG/2ML IJ SOLN
INTRAMUSCULAR | Status: DC | PRN
  Administered 2016-06-04 (×2): 2 mg via INTRAVENOUS

## 2016-06-04 MED ORDER — SODIUM CHLORIDE 0.9 % IV SOLN: INTRAVENOUS | Status: DC

## 2016-06-04 MED ORDER — SODIUM CHLORIDE 0.9% FLUSH: 10.0000 mL | INTRAVENOUS | Status: DC | PRN

## 2016-06-04 MED ORDER — SODIUM CHLORIDE 0.9% FLUSH
10.0000 mL | Freq: Two times a day (BID) | INTRAVENOUS | Status: DC
  Administered 2016-06-04 – 2016-06-06 (×4): 10 mL
  Administered 2016-06-07: 40 mL
  Administered 2016-06-07: 10 mL
  Administered 2016-06-08: 40 mL
  Administered 2016-06-09 – 2016-06-11 (×5): 10 mL

## 2016-06-04 MED ORDER — HEPARIN (PORCINE) IN NACL 100-0.45 UNIT/ML-% IJ SOLN
1400.0000 [IU]/h | INTRAMUSCULAR | Status: DC
  Administered 2016-06-04: 1200 [IU]/h via INTRAVENOUS
  Filled 2016-06-04: qty 250

## 2016-06-04 MED ORDER — FENTANYL CITRATE (PF) 100 MCG/2ML IJ SOLN
INTRAMUSCULAR | Status: AC
  Filled 2016-06-04: qty 2

## 2016-06-04 MED ORDER — MIDAZOLAM HCL 5 MG/ML IJ SOLN
INTRAMUSCULAR | Status: AC
  Filled 2016-06-04: qty 1

## 2016-06-04 MED ORDER — HYDROCODONE-ACETAMINOPHEN 5-325 MG PO TABS
1.0000 | ORAL_TABLET | Freq: Four times a day (QID) | ORAL | Status: DC | PRN
  Administered 2016-06-04 – 2016-06-07 (×6): 1 via ORAL
  Filled 2016-06-04 (×7): qty 1

## 2016-06-04 MED ORDER — FENTANYL CITRATE (PF) 100 MCG/2ML IJ SOLN
INTRAMUSCULAR | Status: DC | PRN
  Administered 2016-06-04 (×2): 25 ug via INTRAVENOUS

## 2016-06-04 MED ORDER — GENTAMICIN IN SALINE 1.2-0.9 MG/ML-% IV SOLN
60.0000 mg | Freq: Two times a day (BID) | INTRAVENOUS | Status: DC
  Administered 2016-06-04 – 2016-06-11 (×14): 60 mg via INTRAVENOUS
  Filled 2016-06-04 (×15): qty 50

## 2016-06-04 MED ORDER — VANCOMYCIN HCL IN DEXTROSE 1-5 GM/200ML-% IV SOLN
1000.0000 mg | INTRAVENOUS | Status: AC
  Administered 2016-06-05: 1000 mg via INTRAVENOUS
  Filled 2016-06-04: qty 200

## 2016-06-04 MED ORDER — BUTAMBEN-TETRACAINE-BENZOCAINE 2-2-14 % EX AERO
INHALATION_SPRAY | CUTANEOUS | Status: DC | PRN
  Administered 2016-06-04: 2 via TOPICAL

## 2016-06-04 MED ORDER — PHYTONADIONE 5 MG PO TABS
2.5000 mg | ORAL_TABLET | ORAL | Status: AC
Start: 2016-06-04 — End: 2016-06-04
  Administered 2016-06-04: 2.5 mg via ORAL
  Filled 2016-06-04: qty 1

## 2016-06-04 MED ORDER — PROSIGHT PO TABS
2.0000 | ORAL_TABLET | Freq: Every day | ORAL | Status: DC
  Administered 2016-06-05 – 2016-06-11 (×6): 2 via ORAL
  Filled 2016-06-04 (×10): qty 2

## 2016-06-04 MED ORDER — POVIDONE-IODINE 10 % EX SWAB: 2.0000 "application " | Freq: Once | CUTANEOUS | Status: DC

## 2016-06-04 MED ORDER — TRANEXAMIC ACID 1000 MG/10ML IV SOLN
1000.0000 mg | INTRAVENOUS | Status: DC
  Filled 2016-06-04 (×2): qty 10

## 2016-06-04 MED ORDER — POTASSIUM CHLORIDE CRYS ER 20 MEQ PO TBCR
40.0000 meq | EXTENDED_RELEASE_TABLET | Freq: Once | ORAL | Status: AC
  Administered 2016-06-04: 40 meq via ORAL
  Filled 2016-06-04: qty 2

## 2016-06-04 MED ORDER — HEPARIN BOLUS VIA INFUSION
2000.0000 [IU] | Freq: Once | INTRAVENOUS | Status: AC
  Administered 2016-06-04: 2000 [IU] via INTRAVENOUS
  Filled 2016-06-04: qty 2000

## 2016-06-04 MED ORDER — ACETAMINOPHEN 10 MG/ML IV SOLN: 1000.0000 mg | INTRAVENOUS | Status: AC

## 2016-06-04 NOTE — Progress Notes (Signed)
ANTICOAGULATION CONSULT NOTE - Follow Up Consult  Pharmacy Consult for heparin Indication: atrial fibrillation  Labs:  Recent Labs  06/01/16 1605 06/01/16 2306 06/02/16 0504 06/03/16 0212 06/04/16 0338  HGB  --  12.7 11.5* 10.8* 10.4*  HCT  --  37.0 34.8* 32.7* 32.5*  PLT  --  121* 113* 114* 128*  APTT  --  59*  --   --   --   LABPROT  --  33.1*  --  24.6* 23.0*  INR  --  3.15  --  2.18 2.00  HEPARINUNFRC  --   --   --   --  <0.10*  CREATININE  --  0.95 0.90 0.67 0.58  CKTOTAL 457*  --   --   --   --      Assessment: 80yo female undetectable on heparin with initial conservative dosing while Coumadin on hold.  Goal of Therapy:  Heparin level 0.3-0.7 units/ml   Plan:  Will increase heparin gtt to 900 units/hr and check level in [place], PharmD, BCPS  06/04/2016,5:20 AM

## 2016-06-04 NOTE — Progress Notes (Signed)
Subjective: No new complaints   Antibiotics:  Anti-infectives    Start     Dose/Rate Route Frequency Ordered Stop   06/04/16 2200  gentamicin (GARAMYCIN) IVPB 60 mg     60 mg 100 mL/hr over 30 Minutes Intravenous Every 12 hours 06/04/16 1558     06/03/16 1800  cefTRIAXone (ROCEPHIN) 2 g in dextrose 5 % 50 mL IVPB     2 g 100 mL/hr over 30 Minutes Intravenous Every 24 hours 06/03/16 1613     06/03/16 1230  ceFAZolin (ANCEF) IVPB 2g/100 mL premix  Status:  Discontinued     2 g 200 mL/hr over 30 Minutes Intravenous Every 8 hours 06/03/16 1132 06/03/16 1613   06/02/16 1800  vancomycin (VANCOCIN) 1,250 mg in sodium chloride 0.9 % 250 mL IVPB  Status:  Discontinued     1,250 mg 166.7 mL/hr over 90 Minutes Intravenous Every 24 hours 06/01/16 1910 06/03/16 1132   06/02/16 0300  aztreonam (AZACTAM) 2 g in dextrose 5 % 50 mL IVPB  Status:  Discontinued     2 g 100 mL/hr over 30 Minutes Intravenous Every 8 hours 06/01/16 1910 06/02/16 1711   06/01/16 1915  vancomycin (VANCOCIN) 1,500 mg in sodium chloride 0.9 % 500 mL IVPB     1,500 mg 250 mL/hr over 120 Minutes Intravenous  Once 06/01/16 1901 06/01/16 2249   06/01/16 1900  aztreonam (AZACTAM) 2 g in dextrose 5 % 50 mL IVPB     2 g 100 mL/hr over 30 Minutes Intravenous  Once 06/01/16 1850 06/01/16 2035   06/01/16 1900  vancomycin (VANCOCIN) IVPB 1000 mg/200 mL premix  Status:  Discontinued     1,000 mg 200 mL/hr over 60 Minutes Intravenous  Once 06/01/16 1850 06/01/16 1901      Medications: Scheduled Meds: . anastrozole  1 mg Oral Daily  . cefTRIAXone (ROCEPHIN)  IV  2 g Intravenous Q24H  . dronedarone  400 mg Oral BID WC  . gentamicin  60 mg Intravenous Q12H  . heparin  2,000 Units Intravenous Once  . Influenza vac split quadrivalent PF  0.5 mL Intramuscular Tomorrow-1000  . insulin aspart  0-5 Units Subcutaneous QHS  . insulin aspart  0-9 Units Subcutaneous TID WC  . insulin glargine  16 Units Subcutaneous QHS  .  losartan  100 mg Oral Daily  . multivitamin  2 tablet Oral Daily  . oxybutynin  10 mg Oral QHS  . pantoprazole  40 mg Oral Daily  . sodium chloride flush  3 mL Intravenous Q12H  . [START ON 06/05/2016] tranexamic acid  1,000 mg Intravenous To OR   Continuous Infusions: . sodium chloride 125 mL/hr at 06/04/16 0542  . heparin 900 Units/hr (06/04/16 0542)   PRN Meds:.acetaminophen, dextrose, HYDROcodone-acetaminophen, loperamide, morphine injection, zolpidem    Objective: Weight change:   Intake/Output Summary (Last 24 hours) at 06/04/16 1632 Last data filed at 06/04/16 0814  Gross per 24 hour  Intake           1857.8 ml  Output              103 ml  Net           1754.8 ml   Blood pressure (!) 146/74, pulse 97, temperature 99.7 F (37.6 C), temperature source Oral, resp. rate (!) 26, height 5\' 3"  (1.6 m), weight 182 lb (82.6 kg), SpO2 96 %. Temp:  [99 F (37.2 C)-100.6 F (38.1 C)] 99.7 F (  37.6 C) (10/05 1327) Pulse Rate:  [86-102] 97 (10/05 1602) Resp:  [17-28] 26 (10/05 1602) BP: (115-155)/(51-86) 146/74 (10/05 1602) SpO2:  [95 %-100 %] 96 % (10/05 1602)  Physical Exam: General: Alert and awake, oriented x3, not in any acute distress. General: Alert and awake, oriented x3, not in any acute distress. HEENT: anicteric sclera,  EOMI, oropharynx clear and without exudate Cardiovascular: regular rate, normal r,  no murmur rubs or gallops Pulmonary: clear to auscultation bilaterally, no wheezing, rales or rhonchi Gastrointestinal: soft nontender, nondistended, normal bowel sounds, Musculoskeletal: edema warmth left knee Neuro: nonfocal  CBC:  CBC Latest Ref Rng & Units 06/04/2016 06/03/2016 06/02/2016  WBC 4.0 - 10.5 K/uL 7.2 6.1 7.5  Hemoglobin 12.0 - 15.0 g/dL 10.4(L) 10.8(L) 11.5(L)  Hematocrit 36.0 - 46.0 % 32.5(L) 32.7(L) 34.8(L)  Platelets 150 - 400 K/uL 128(L) 114(L) 113(L)      BMET  Recent Labs  06/03/16 0212 06/04/16 0338  NA 132* 133*  K 3.0* 3.3*  CL  103 101  CO2 22 22  GLUCOSE 212* 199*  BUN 16 8  CREATININE 0.67 0.58  CALCIUM 7.7* 8.0*     Liver Panel   Recent Labs  06/03/16 0212 06/04/16 0338  PROT 4.7* 4.9*  ALBUMIN 2.1* 2.1*  AST 17 22  ALT 17 19  ALKPHOS 55 79  BILITOT 1.3* 0.8       Sedimentation Rate No results for input(s): ESRSEDRATE in the last 72 hours. C-Reactive Protein No results for input(s): CRP in the last 72 hours.  Micro Results: Recent Results (from the past 720 hour(s))  Gram stain     Status: None   Collection Time: 06/01/16  4:50 PM  Result Value Ref Range Status   Specimen Description FLUID SYNOVIAL LEFT KNEE  Final   Special Requests NONE  Final   Gram Stain   Final    ABUNDANT WBC PRESENT, PREDOMINANTLY PMN RARE INTRACELLULAR GRAM POSITIVE COCCI Gram Stain Report Called to,Read Back By and Verified With: EDWARDS,C. AT 1804 ON 06/01/2016 BY BAUGHAM,M.    Report Status 06/01/2016 FINAL  Final  Culture, body fluid-bottle     Status: Abnormal   Collection Time: 06/01/16  4:50 PM  Result Value Ref Range Status   Specimen Description FLUID SYNOVIAL LEFT KNEE  Final   Special Requests BOTTLES DRAWN AEROBIC AND ANAEROBIC 3CC EACH  Final   Gram Stain   Final    GRAM POSITIVE COCCI IN CHAINS IN BOTH AEROBIC AND ANAEROBIC BOTTLES Gram Stain Report Called to,Read Back By and Verified With: REAP B AT [place] AT 0600 ON [id number] BY FORSYTH K Performed at [place] B STREP(S.AGALACTIAE)ISOLATED (A)  Final   Report Status 06/04/2016 FINAL  Final   Organism ID, Bacteria GROUP B STREP(S.AGALACTIAE)ISOLATED  Final      Susceptibility   Group b strep(s.agalactiae)isolated - MIC*    CLINDAMYCIN >=1 RESISTANT Resistant     AMPICILLIN <=0.25 SENSITIVE Sensitive     ERYTHROMYCIN >=8 RESISTANT Resistant     VANCOMYCIN 0.5 SENSITIVE Sensitive     CEFTRIAXONE <=0.12 SENSITIVE Sensitive     LEVOFLOXACIN 1 SENSITIVE Sensitive     * GROUP B STREP(S.AGALACTIAE)ISOLATED  Blood  Culture (routine x 2)     Status: Abnormal   Collection Time: 06/01/16  7:42 PM  Result Value Ref Range Status   Specimen Description BLOOD LEFT HAND  Final   Special Requests BOTTLES DRAWN AEROBIC ONLY [place]  Final  Culture  Setup Time   Final    GRAM POSITIVE COCCI IN CHAINS RECOVERED FROM THE AEROBIC BOTTLE Gram Stain Report Called to,Read Back By and Verified With: AGUIRRE,A. AT [id number] ON 06/02/2016 BY BAUGHAM,M. Performed at [place] ID to follow CRITICAL RESULT CALLED TO, READ BACK BY AND VERIFIED WITH: G ABBOTT PHARMD 2300 06/02/16 A BROWNING Performed at [place] B STREP(S.AGALACTIAE)ISOLATED (A)  Final   Report Status 06/04/2016 FINAL  Final   Organism ID, Bacteria GROUP B STREP(S.AGALACTIAE)ISOLATED  Final      Susceptibility   Group b strep(s.agalactiae)isolated - MIC*    CLINDAMYCIN >=1 RESISTANT Resistant     AMPICILLIN <=0.25 SENSITIVE Sensitive     ERYTHROMYCIN >=8 RESISTANT Resistant     VANCOMYCIN 0.5 SENSITIVE Sensitive     CEFTRIAXONE <=0.12 SENSITIVE Sensitive     LEVOFLOXACIN 1 SENSITIVE Sensitive     * GROUP B STREP(S.AGALACTIAE)ISOLATED  Blood Culture ID Panel (Reflexed)     Status: Abnormal   Collection Time: 06/01/16  7:42 PM  Result Value Ref Range Status   Enterococcus species NOT DETECTED NOT DETECTED Final   Vancomycin resistance NOT DETECTED NOT DETECTED Final   Listeria monocytogenes NOT DETECTED NOT DETECTED Final   Staphylococcus species NOT DETECTED NOT DETECTED Final   Staphylococcus aureus NOT DETECTED NOT DETECTED Final   Methicillin resistance NOT DETECTED NOT DETECTED Final   Streptococcus species DETECTED (A) NOT DETECTED Final    Comment: CRITICAL RESULT CALLED TO, READ BACK BY AND VERIFIED WITH: G ABBOTT PHARMD 2300 06/02/16 A BROWNING    Streptococcus agalactiae DETECTED (A) NOT DETECTED Final    Comment: CRITICAL RESULT CALLED TO, READ BACK BY AND VERIFIED WITH: G ABBOTT PHARMD 2300 06/02/16  A BROWNING    Streptococcus pneumoniae NOT DETECTED NOT DETECTED Final   Streptococcus pyogenes NOT DETECTED NOT DETECTED Final   Acinetobacter baumannii NOT DETECTED NOT DETECTED Final   Enterobacteriaceae species NOT DETECTED NOT DETECTED Final   Enterobacter cloacae complex NOT DETECTED NOT DETECTED Final   Escherichia coli NOT DETECTED NOT DETECTED Final   Klebsiella oxytoca NOT DETECTED NOT DETECTED Final   Klebsiella pneumoniae NOT DETECTED NOT DETECTED Final   Proteus species NOT DETECTED NOT DETECTED Final   Serratia marcescens NOT DETECTED NOT DETECTED Final   Carbapenem resistance NOT DETECTED NOT DETECTED Final   Haemophilus influenzae NOT DETECTED NOT DETECTED Final   Neisseria meningitidis NOT DETECTED NOT DETECTED Final   Pseudomonas aeruginosa NOT DETECTED NOT DETECTED Final   Candida albicans NOT DETECTED NOT DETECTED Final   Candida glabrata NOT DETECTED NOT DETECTED Final   Candida krusei NOT DETECTED NOT DETECTED Final   Candida parapsilosis NOT DETECTED NOT DETECTED Final   Candida tropicalis NOT DETECTED NOT DETECTED Final    Comment: Performed at [facility]  Blood Culture (routine x 2)     Status: None (Preliminary result)   Collection Time: 06/01/16  7:54 PM  Result Value Ref Range Status   Specimen Description BLOOD LEFT HAND  Final   Special Requests BOTTLES DRAWN AEROBIC ONLY [place]  Final   Culture NO GROWTH 3 DAYS  Final   Report Status PENDING  Incomplete  Urine culture     Status: Abnormal   Collection Time: 06/01/16 10:01 PM  Result Value Ref Range Status   Specimen Description URINE, CLEAN CATCH  Final   Special Requests NONE  Final   Culture >=100,000 COLONIES/mL ESCHERICHIA COLI (A)  Final  Report Status 06/04/2016 FINAL  Final   Organism ID, Bacteria ESCHERICHIA COLI (A)  Final      Susceptibility   Escherichia coli - MIC*    AMPICILLIN >=32 RESISTANT Resistant     CEFAZOLIN 32 INTERMEDIATE Intermediate     CEFTRIAXONE <=1 SENSITIVE  Sensitive     CIPROFLOXACIN <=0.25 SENSITIVE Sensitive     GENTAMICIN <=1 SENSITIVE Sensitive     IMIPENEM <=0.25 SENSITIVE Sensitive     NITROFURANTOIN <=16 SENSITIVE Sensitive     TRIMETH/SULFA <=20 SENSITIVE Sensitive     AMPICILLIN/SULBACTAM >=32 RESISTANT Resistant     PIP/TAZO 8 SENSITIVE Sensitive     Extended ESBL NEGATIVE Sensitive     * >=100,000 COLONIES/mL ESCHERICHIA COLI  MRSA PCR Screening     Status: None   Collection Time: 06/02/16  2:50 AM  Result Value Ref Range Status   MRSA by PCR NEGATIVE NEGATIVE Final    Comment:        The GeneXpert MRSA Assay (FDA approved for NASAL specimens only), is one component of a comprehensive MRSA colonization surveillance program. It is not intended to diagnose MRSA infection nor to guide or monitor treatment for MRSA infections.   Culture, blood (Routine X 2) w Reflex to ID Panel     Status: None (Preliminary result)   Collection Time: 06/02/16  6:55 PM  Result Value Ref Range Status   Specimen Description BLOOD LEFT HAND  Final   Special Requests BOTTLES DRAWN AEROBIC ONLY 5CC  Final   Culture NO GROWTH 2 DAYS  Final   Report Status PENDING  Incomplete  Culture, blood (Routine X 2) w Reflex to ID Panel     Status: None (Preliminary result)   Collection Time: 06/02/16  7:05 PM  Result Value Ref Range Status   Specimen Description BLOOD LEFT ANTECUBITAL  Final   Special Requests BOTTLES DRAWN AEROBIC AND ANAEROBIC 10 CC EA  Final   Culture NO GROWTH 2 DAYS  Final   Report Status PENDING  Incomplete    Studies/Results: No results found.    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 Marlou Porch, Dr Stanford Breed and Dr Noralyn Pick critical help with this patient  My understanding is REMOVAL OF PROSTHESIS with I and D is planned in the OR tomorrow  We will plan on 6 weeks of IV ceftriaxone and 2 weeks of IV gentamicin with day #1 being Saturday  She will need her GFR followed clearly and  high vigilance for hearing loss on gent  Son and the patient want to move the patient to [place] where she will need to be followed by an infectious disease doctor in [place] and an orthopedic surgery physician who can assess when it is appropriate to reimplant a new knee sometime after she has finished her antimicrobial therapy  I spent greater than 35  minutes with the patient including greater than 50% of time in face to face counsel of the patient and son re her endocarditis, group B strep, PJI  and in coordination of her care.    LOS: 3 days   Alcide Evener 06/04/2016, 4:32 PM

## 2016-06-04 NOTE — Anesthesia Preprocedure Evaluation (Addendum)
Anesthesia Evaluation  Patient identified by MRN, date of birth, ID band Patient awake    Reviewed: Allergy & Precautions, NPO status , Patient's Chart, lab work & pertinent test results, reviewed documented beta blocker date and time   Airway Mallampati: II  TM Distance: >3 FB Neck ROM: Full    Dental  (+) Dental Advisory Given, Teeth Intact   Pulmonary former smoker,   Shortness of breath at baseline   + decreased breath sounds      Cardiovascular hypertension, Pt. on home beta blockers +CHF  + dysrhythmias Atrial Fibrillation + Valvular Problems/Murmurs (now s/p MV Repair, now aortic valve endocarditis) MR  Rhythm:Irregular Rate:Tachycardia  [id number] TEE: Systolic function was mildly to moderately   reduced. The estimated ejection fraction was in the range of 40% to 45%. Diffuse hypokinesis. - Aortic valve: There was an apparent, small, mobile vegetation on the noncoronary and right cusps. There was trivial regurgitation. - Mitral valve: Prior procedures included surgical repair. No   evidence of vegetation. The findings are consistent with mild stenosis. There was mild regurgitation.   Neuro/Psych    GI/Hepatic   Endo/Other  diabetes, Oral Hypoglycemic AgentsMorbid obesity  Renal/GU      Musculoskeletal  (+) Arthritis  (now septic arthritis of prosthetic knee), Osteoarthritis,    Abdominal   Peds  Hematology  (+) Blood dyscrasia (coumadin), ,   Anesthesia Other Findings Breast cancer, with now bacteremia and AV endocarditis, needs her total knee prosthesis removed, hx of afib currently in afib  Metoprolol Oral given in preop  Reproductive/Obstetrics                           Anesthesia Physical Anesthesia Plan  ASA: III  Anesthesia Plan: General and Regional   Post-op Pain Management:    Induction: Intravenous  Airway Management Planned: Oral ETT  Additional Equipment:  None  Intra-op Plan:   Post-operative Plan: Extubation in OR  Informed Consent: I have reviewed the patients History and Physical, chart, labs and discussed the procedure including the risks, benefits and alternatives for the proposed anesthesia with the patient or authorized representative who has indicated his/her understanding and acceptance.     Plan Discussed with: Anesthesiologist and CRNA  Anesthesia Plan Comments: (Will do GA + adductor canal block given hx of coumadin in last 4-5 days, INR of 1.3 with elevated PT and currently with bacteremia )       Anesthesia Quick Evaluation

## 2016-06-04 NOTE — Progress Notes (Signed)
Patient Name: Toni Hanna Date of Encounter: 06/04/2016  Primary Cardiologist: New (see's MD in [facility]  [facility] Problem List     Principal Problem:   Pyogenic arthritis of left knee joint [facility]) Active Problems:   Uncontrolled type 2 diabetes mellitus (HCC)   CAD (coronary artery disease)   Hypertension   UTI (urinary tract infection)   Hypophosphatemia   History of breast cancer   Sepsis ([place])   Cellulitis of right breast   Group B streptococcal bacteriuria   Group B streptococcal infection   Sepsis due to group B Streptococcus (HCC)   Acute on chronic combined systolic and diastolic congestive heart failure ([place])     Subjective   Finished with TEE. No complaints. No CP, no SOB  Inpatient Medications    Scheduled Meds: . anastrozole  1 mg Oral Daily  . cefTRIAXone (ROCEPHIN)  IV  2 g Intravenous Q24H  . dronedarone  400 mg Oral BID WC  . Influenza vac split quadrivalent PF  0.5 mL Intramuscular Tomorrow-1000  . insulin aspart  0-5 Units Subcutaneous QHS  . insulin aspart  0-9 Units Subcutaneous TID WC  . insulin glargine  16 Units Subcutaneous QHS  . losartan  100 mg Oral Daily  . multivitamin  2 tablet Oral Daily  . oxybutynin  10 mg Oral QHS  . pantoprazole  40 mg Oral Daily  . sodium chloride flush  3 mL Intravenous Q12H   Continuous Infusions: . sodium chloride 125 mL/hr at 06/04/16 0542  . heparin 900 Units/hr (06/04/16 0542)   PRN Meds:.acetaminophen, dextrose, loperamide, morphine injection, zolpidem   Vital Signs    Vitals:   06/04/16 1020 06/04/16 1025 06/04/16 1035 06/04/16 1045  BP: 129/86 115/66 121/72 123/68  Pulse: 92 91 94 93  Resp: (!) 23 (!) 26 (!) 23 (!) 24  Temp:   99 F (37.2 C)   TempSrc:   Oral   SpO2: 96% 97% 95% 95%  Weight:      Height:        Intake/Output Summary (Last 24 hours) at 06/04/16 1211 Last data filed at 06/04/16 0814  Gross per 24 hour  Intake           2837.8 ml  Output              104 ml  Net            2733.8 ml   Filed Weights   06/01/16 1509  Weight: 182 lb (82.6 kg)    Physical Exam    GEN: Well nourished, well developed, in no acute distress.  HEENT: Grossly normal.  Neck: Supple, no JVD, carotid bruits, or masses. Cardiac: RRR, no murmurs, rubs, or gallops. No clubbing, cyanosis, edema.  Radials/DP/PT 2+ and equal bilaterally.  Respiratory:  Respirations regular and unlabored, clear to auscultation bilaterally. GI: Soft, nontender, nondistended, BS + x 4. MS: no deformity or atrophy. Skin: warm and dry, no rash, warmth of left prosthetic knee Neuro:  Strength and sensation are intact. Psych: AAOx3.  Normal affect.  Labs    CBC  Recent Labs  06/03/16 0212 06/04/16 0338  WBC 6.1 7.2  NEUTROABS 4.5 5.1  HGB 10.8* 10.4*  HCT 32.7* 32.5*  MCV 86.3 86.9  PLT 114* 0000000*   Basic Metabolic Panel  Recent Labs  06/02/16 0504 06/03/16 0212 06/04/16 0338  NA 131* 132* 133*  K 3.3* 3.0* 3.3*  CL 100* 103 101  CO2 23 22 22  GLUCOSE 141* 212* 199*  BUN 25* 16 8  CREATININE 0.90 0.67 0.58  CALCIUM 8.0* 7.7* 8.0*  MG 1.9 1.6* 1.8  PHOS 1.2* 1.8*  --    Liver Function Tests  Recent Labs  06/03/16 0212 06/04/16 0338  AST 17 22  ALT 17 19  ALKPHOS 55 79  BILITOT 1.3* 0.8  PROT 4.7* 4.9*  ALBUMIN 2.1* 2.1*   No results for input(s): LIPASE, AMYLASE in the last 72 hours. Cardiac Enzymes  Recent Labs  06/01/16 1605  CKTOTAL 457*   BNP Invalid input(s): POCBNP D-Dimer No results for input(s): DDIMER in the last 72 hours. Hemoglobin A1C  Recent Labs  06/02/16 1245  HGBA1C 10.4*    Telemetry    NSR, PAC's (does not appear to be AFIB) - Personally Reviewed  ECG    NSR, IVCD - Personally Reviewed  Radiology    No results found.   Cardiac Studies   TEE: EF 45% Aortic valve small vegetation, no AI MVR intact, mild MR  ECHO: EF 35% Patient Profile     80 year old with MVR, PAF, MAZE, L prosthetic knee, GBS bacteremia,  endocarditis.  Assessment & Plan    Endocarditis/ Gram positive bacteremia  - small mobile vegetation on aortic valve  - IV Abx per ID  - since there is no deterioration of AV, no AI, non-surgical.   - Ortho note reviewed  Cardiomyopathy  - no symptoms, no dyspnea  - Continue metoprolol and ARB  - Euvolemic  MVR  - annuloplasty intact  - mild MR  - 2003  PAF  - MAZE  - Tele appears sinus with PAC's  - Currently on Multaq  - There is a contraindication to this medication in anyone with symptomatic heart failure, that is heart failure with recent decompensation requiring hospitalization or class IV symptoms. Since she does not have current decompensated heart failure, and since she has been on this medication for quite some time, we will continue at this point. Have caution if heart failure symptoms advance.   Signed, Candee Furbish, MD  06/04/2016, 12:11 PM

## 2016-06-04 NOTE — CV Procedure (Signed)
See full TEE report in [employer] Pt sedated with versed 4 mg and fentanyl 50 micrograms IV. Mild to moderate reduction in LV function Small oscillating densities on aortic side of noncoronary and right coronary cusps concerning for vegetation; trace AI. S/p MV repair with mild MR. Severe TR with severely elevated pulmonary pressure (TR velocity 3.74 m/s). Full report to follow. Kirk Ruths, MD

## 2016-06-04 NOTE — Progress Notes (Signed)
  Echocardiogram Echocardiogram Transesophageal has been performed.  Toni Hanna 06/04/2016, 10:45 AM

## 2016-06-04 NOTE — Progress Notes (Signed)
[place] for Heparin infusion  Indication: atrial fibrillation bridge therapy while coumadin on hold for OR  Allergies  Allergen Reactions  . Amoxicillin Swelling  . Penicillins Swelling    Has patient had a PCN reaction causing immediate rash, facial/tongue/throat swelling, SOB or lightheadedness with hypotension: Yes Has patient had a PCN reaction causing severe rash involving mucus membranes or skin necrosis: No Has patient had a PCN reaction that required hospitalization No Has patient had a PCN reaction occurring within the last 10 years: No If all of the above answers are "NO", then may proceed with Cephalosporin use.  Tolerated ceftriaxone     Patient Measurements: Height: 5\' 3"  (160 cm) Weight: 182 lb (82.6 kg) IBW/kg (Calculated) : 52.4  Heparin dosing weight: 70.6 kg  Vital Signs: Temp: 99.7 F (37.6 C) (10/05 1327) Temp Source: Oral (10/05 1327) BP: 118/67 (10/05 1327) Pulse Rate: 97 (10/05 1327)  Labs:  Recent Labs  06/01/16 1605 06/01/16 2306 06/02/16 0504 06/03/16 0212 06/04/16 0338 06/04/16 1316  HGB  --  12.7 11.5* 10.8* 10.4*  --   HCT  --  37.0 34.8* 32.7* 32.5*  --   PLT  --  121* 113* 114* 128*  --   APTT  --  59*  --   --   --   --   LABPROT  --  33.1*  --  24.6* 23.0*  --   INR  --  3.15  --  2.18 2.00  --   HEPARINUNFRC  --   --   --   --  <0.10* 0.11*  CREATININE  --  0.95 0.90 0.67 0.58  --   CKTOTAL 457*  --   --   --   --   --     Estimated Creatinine Clearance: 59 mL/min (by C-G formula based on SCr of 0.58 mg/dL).   Medical History: Past Medical History:  Diagnosis Date  . Breast cancer ([place]) 2015  . Coronary artery disease   . Diabetes mellitus without complication ([place])   . Hypertension    Assessment: 80 yo F on warfarin PTA for history of paryoxysmal atrial fibrillation. Also h/o previous Maze procedure Hanna, Toni with mitral valve repair. No coumadin given 10/2.  Coumadin 2.5 mg  given 10/3. Vitamin K 2.5 mg po givdn 10/4 pm.  Heparin bridge therapy started last night so coumadin could be reversed for OR Friday. 10/5 [id number] mobile vegetation on arotic valve Heparin level low at 0.11 after rate increased from 600 to 900 units/hr.  Per RN heparin drip IV came out ~ 1230.  Heparin level drawn 1315.  INR 2. D/w Dr. Ree Kida - repeat vitamin k 2.5 mg po.   Goal of Therapy:  Heparin level = 0.3-0.7   Plan: heparin 2000 unit bolus and increase rate to 1200 units/hr Heparin level in 8 hours Vitamin K 2.5 mg po x 1 dose now to reverse coumadin for OR tomorrow Daily Heparin level and CBC and INR  Eudelia Bunch, Pharm.D. [id number] 06/04/2016 2:49 PM

## 2016-06-04 NOTE — H&P (View-Only) (Signed)
PROGRESS NOTE    Toni Hanna  [id number] DOB: 1935-11-29 DOA: 06/01/2016 PCP: Robert Bellow, MD   Chief Complaint  Patient presents with  . Knee Pain  . Hyperglycemia     Brief Narrative:  HPI on 06/01/2016 by Dr. Jackquline Berlin a 80 y.o.femalewith medical history significant of breast cancer, history of right breast lumpectomy, CAD, type 2 diabetes, hypertension who is coming to the emergency department due to left knee pain. Per patient, she started having left knee pain on Friday which continuedto get worse through the weekend. She had a left knee arthroplasty in [place] about 10 years ago. She states that she fell on the floor on Sunday evening due to weakness and was unable to get up from the floor, so she decided to go to sleep. This morning, when she woke up, she crawled to the other room to reach her phone, madea phone call for help and was subsequently brought to the emergency department. She estimates that she spent about 12 hours on the floor. She denies fever, but complains of chills, fatigue, generalized weakness, decreased appetite. Dr. Sabra Heck from the emergency department and spoke to Dr. Swintek(orthopedic surgery) who suggested transfer to [facility] for further evaluation and treatment.   Assessment & Plan   Sepsis secondary to septic arthritis left knee/Strep bacteremia -Upon admission, patient had mild leukocytosis, elevated lactic acid, soft BP.  She has been tachycardic and tachypneic.  -Fluid culture GPC in chains, culture strep agalactaie  -Blood cultures from 06/01/2016 show Strep agalactaie in 1/2 bottles -Initially placed on on Aztreonam, vanco  -Repeat Blood cultures from 06/02/2016 show no growth to date -ID consulted and appreciated.  -Echocardiogram: EF 30-35%.  Spoke with hospitalist in [place], who states her EF was 50-55% in June 2017.  -Orthopedics consulted and appreciated- plan for resection arthroplasty, antibiotic  spacer -Patient currently on ceftriaxone (given her PCN allergy- will watch and monitor very closely) -Cardiology consulted and appreciated, plan for TEE today  New systolic CHF -Echo as above -Cardiology consulted and appreciated -Continue current medications -Currently euvolemic  -monitor intake/output, daily weights  Sepsis secondary to right breast cellulitis and ?UTI -Continue current IV antibiotic treatment as above -Urine culture: Ecoli -Erythema of the right breast appears to be improving  Uncontrolled type 2 diabetes mellitus -Hold metformin, glipizide  -emoglobin A1c 10.4 -Continue Lantus, ISS, CBG monitoring   Hypokalemia/ hypomagnesemia -Replaced, continue to monitor   Hypophosphatemia -Resolved with replacement  A Fib on coumadin -CHADSVASC 5 (age, gender, DM, HTN) -Rate controlled -Metoprolol held due to low BP  -Coumadin held. Started on heparin -INR 2 -Given Vit K 2.5mg  orally.  Continue to watch INR.  Hypertension -Metoprolol held due to low BP   Lactic acidosis -Resolved with IVF   History of breast cancer -Continue Arimidex   DVT Prophylaxis  Coumadin  Code Status: Full  Family Communication: None at bedside.  Spoke with Son and daughter-in-law via phone.    Disposition Plan: Admitted. Pending TEE today and surgery 10/6 Patient's family would like patient transferred to [facility]. Patient was accepted in transfer, however, currently no beds available.   Consultants Orthopedics Infectious disease Cardiology  Procedures  None  Antibiotics   Anti-infectives    Start     Dose/Rate Route Frequency Ordered Stop   06/03/16 1800  [MAR Hold]  cefTRIAXone (ROCEPHIN) 2 g in dextrose 5 % 50 mL IVPB     (MAR Hold since 06/04/16 0921)  2 g 100 mL/hr over 30 Minutes Intravenous Every 24 hours 06/03/16 1613     06/03/16 1230  ceFAZolin (ANCEF) IVPB 2g/100 mL premix  Status:  Discontinued     2 g 200 mL/hr over 30  Minutes Intravenous Every 8 hours 06/03/16 1132 06/03/16 1613   06/02/16 1800  vancomycin (VANCOCIN) 1,250 mg in sodium chloride 0.9 % 250 mL IVPB  Status:  Discontinued     1,250 mg 166.7 mL/hr over 90 Minutes Intravenous Every 24 hours 06/01/16 1910 06/03/16 1132   06/02/16 0300  aztreonam (AZACTAM) 2 g in dextrose 5 % 50 mL IVPB  Status:  Discontinued     2 g 100 mL/hr over 30 Minutes Intravenous Every 8 hours 06/01/16 1910 06/02/16 1711   06/01/16 1915  vancomycin (VANCOCIN) 1,500 mg in sodium chloride 0.9 % 500 mL IVPB     1,500 mg 250 mL/hr over 120 Minutes Intravenous  Once 06/01/16 1901 06/01/16 2249   06/01/16 1900  aztreonam (AZACTAM) 2 g in dextrose 5 % 50 mL IVPB     2 g 100 mL/hr over 30 Minutes Intravenous  Once 06/01/16 1850 06/01/16 2035   06/01/16 1900  vancomycin (VANCOCIN) IVPB 1000 mg/200 mL premix  Status:  Discontinued     1,000 mg 200 mL/hr over 60 Minutes Intravenous  Once 06/01/16 1850 06/01/16 1901      Subjective:   Toni Hanna seen and examined today.  Patient complains of some knee pain, 8/10, worsens with activity or movement.  Denies chest pain, shortness of breath, abdominal pain, nausea, vomiting, diarrhea, constipation.    Objective:   Vitals:   06/04/16 0537 06/04/16 0817 06/04/16 0832 06/04/16 0929  BP: (!) 145/63 (!) 127/57 (!) 127/57 (!) 150/64  Pulse: 88 86 93 95  Resp:  17 20 (!) 27  Temp: (!) 100.6 F (38.1 C)  99.8 F (37.7 C) 99.7 F (37.6 C)  TempSrc: Oral  Oral Oral  SpO2:  95% 97% 98%  Weight:      Height:        Intake/Output Summary (Last 24 hours) at 06/04/16 0942 Last data filed at 06/04/16 0600  Gross per 24 hour  Intake           3692.8 ml  Output                4 ml  Net           3688.8 ml   Filed Weights   06/01/16 1509  Weight: 82.6 kg (182 lb)    Exam  General: Well developed, well nourished, NAD, appears stated age  HEENT: NCAT,  mucous membranes moist.   Neck: Supple, no JVD, no masses  Cardiovascular:  S1 S2 auscultated, 2/6 SEM, RRR  Respiratory: Clear to auscultation bilaterally with equal chest rise  Abdomen: Soft, nontender, nondistended, + bowel sounds  Extremities: warm dry without cyanosis clubbing or edema. Left knee/leg swelling, mild pain with ROM  Neuro: AAOx3, nonfocal  Skin: Small area of erythema on right breast-resolved  Psych: Normal affect and demeanor, pleasant    Data Reviewed: I have personally reviewed following labs and imaging studies  CBC:  Recent Labs Lab 06/01/16 1551 06/01/16 2306 06/02/16 0504 06/03/16 0212 06/04/16 0338  WBC 11.5* 8.9 7.5 6.1 7.2  NEUTROABS 9.0* 6.8 5.5 4.5 5.1  HGB 14.2 12.7 11.5* 10.8* 10.4*  HCT 41.1 37.0 34.8* 32.7* 32.5*  MCV 85.3 84.7 85.1 86.3 86.9  PLT 115* 121* 113* 114* 128*  Basic Metabolic Panel:  Recent Labs Lab 06/01/16 1551 06/01/16 2306 06/02/16 0504 06/03/16 0212 06/04/16 0338  NA 124* 132* 131* 132* 133*  K 4.1 3.5 3.3* 3.0* 3.3*  CL 90* 101 100* 103 101  CO2 21* 24 23 22 22   GLUCOSE 559* 169* 141* 212* 199*  BUN 25* 26* 25* 16 8  CREATININE 1.35* 0.95 0.90 0.67 0.58  CALCIUM 8.8* 8.1* 8.0* 7.7* 8.0*  MG 1.9  --  1.9 1.6* 1.8  PHOS 1.9*  --  1.2* 1.8*  --    GFR: Estimated Creatinine Clearance: 59 mL/min (by C-G formula based on SCr of 0.58 mg/dL). Liver Function Tests:  Recent Labs Lab 06/01/16 2306 06/02/16 0504 06/03/16 0212 06/04/16 0338  AST 28 23 17 22   ALT 21 18 17 19   ALKPHOS 63 56 55 79  BILITOT 1.1 1.2 1.3* 0.8  PROT 5.4* 4.7* 4.7* 4.9*  ALBUMIN 2.7* 2.3* 2.1* 2.1*   No results for input(s): LIPASE, AMYLASE in the last 168 hours. No results for input(s): AMMONIA in the last 168 hours. Coagulation Profile:  Recent Labs Lab 06/01/16 2306 06/03/16 0212 06/04/16 0338  INR 3.15 2.18 2.00   Cardiac Enzymes:  Recent Labs Lab 06/01/16 1605  CKTOTAL 457*   BNP (last 3 results) No results for input(s): PROBNP in the last 8760 hours. HbA1C:  Recent Labs   06/02/16 1245  HGBA1C 10.4*   CBG:  Recent Labs Lab 06/03/16 1234 06/03/16 1848 06/03/16 2206 06/04/16 0831 06/04/16 0928  GLUCAP 244* 244* 240* 163* 171*   Lipid Profile: No results for input(s): CHOL, HDL, LDLCALC, TRIG, CHOLHDL, LDLDIRECT in the last 72 hours. Thyroid Function Tests: No results for input(s): TSH, T4TOTAL, FREET4, T3FREE, THYROIDAB in the last 72 hours. Anemia Panel: No results for input(s): VITAMINB12, FOLATE, FERRITIN, TIBC, IRON, RETICCTPCT in the last 72 hours. Urine analysis:    Component Value Date/Time   COLORURINE YELLOW 06/01/2016 2201   APPEARANCEUR HAZY (A) 06/01/2016 2201   LABSPEC 1.025 06/01/2016 2201   PHURINE 5.5 06/01/2016 2201   GLUCOSEU >1000 (A) 06/01/2016 2201   HGBUR MODERATE (A) 06/01/2016 2201   BILIRUBINUR NEGATIVE 06/01/2016 2201   KETONESUR NEGATIVE 06/01/2016 2201   PROTEINUR 30 (A) 06/01/2016 2201   NITRITE NEGATIVE 06/01/2016 2201   LEUKOCYTESUR SMALL (A) 06/01/2016 2201   Sepsis Labs: @LABRCNTIP (procalcitonin:4,lacticidven:4)  ) Recent Results (from the past 240 hour(s))  Gram stain     Status: None   Collection Time: 06/01/16  4:50 PM  Result Value Ref Range Status   Specimen Description FLUID SYNOVIAL LEFT KNEE  Final   Special Requests NONE  Final   Gram Stain   Final    ABUNDANT WBC PRESENT, PREDOMINANTLY PMN RARE INTRACELLULAR GRAM POSITIVE COCCI Gram Stain Report Called to,Read Back By and Verified With: EDWARDS,C. AT 1804 ON 06/01/2016 BY BAUGHAM,M.    Report Status 06/01/2016 FINAL  Final  Culture, body fluid-bottle     Status: Abnormal (Preliminary result)   Collection Time: 06/01/16  4:50 PM  Result Value Ref Range Status   Specimen Description FLUID SYNOVIAL LEFT KNEE  Final   Special Requests BOTTLES DRAWN AEROBIC AND ANAEROBIC 3CC EACH  Final   Gram Stain   Final    GRAM POSITIVE COCCI IN CHAINS IN BOTH AEROBIC AND ANAEROBIC BOTTLES Gram Stain Report Called to,Read Back By and Verified With: REAP  B AT [place] AT 0600 ON [id number] BY FORSYTH K Performed at [place] B  STREP(S.AGALACTIAE)ISOLATED (A)  Final   Report Status PENDING  Incomplete  Blood Culture (routine x 2)     Status: Abnormal   Collection Time: 06/01/16  7:42 PM  Result Value Ref Range Status   Specimen Description BLOOD LEFT HAND  Final   Special Requests BOTTLES DRAWN AEROBIC ONLY [place]  Final   Culture  Setup Time   Final    GRAM POSITIVE COCCI IN CHAINS RECOVERED FROM THE AEROBIC BOTTLE Gram Stain Report Called to,Read Back By and Verified With: AGUIRRE,A. AT [id number] ON 06/02/2016 BY BAUGHAM,M. Performed at [place] ID to follow CRITICAL RESULT CALLED TO, READ BACK BY AND VERIFIED WITH: G ABBOTT PHARMD 2300 06/02/16 A BROWNING Performed at [place] B STREP(S.AGALACTIAE)ISOLATED (A)  Final   Report Status 06/04/2016 FINAL  Final   Organism ID, Bacteria GROUP B STREP(S.AGALACTIAE)ISOLATED  Final      Susceptibility   Group b strep(s.agalactiae)isolated - MIC*    CLINDAMYCIN >=1 RESISTANT Resistant     AMPICILLIN <=0.25 SENSITIVE Sensitive     ERYTHROMYCIN >=8 RESISTANT Resistant     VANCOMYCIN 0.5 SENSITIVE Sensitive     CEFTRIAXONE <=0.12 SENSITIVE Sensitive     LEVOFLOXACIN 1 SENSITIVE Sensitive     * GROUP B STREP(S.AGALACTIAE)ISOLATED  Blood Culture ID Panel (Reflexed)     Status: Abnormal   Collection Time: 06/01/16  7:42 PM  Result Value Ref Range Status   Enterococcus species NOT DETECTED NOT DETECTED Final   Vancomycin resistance NOT DETECTED NOT DETECTED Final   Listeria monocytogenes NOT DETECTED NOT DETECTED Final   Staphylococcus species NOT DETECTED NOT DETECTED Final   Staphylococcus aureus NOT DETECTED NOT DETECTED Final   Methicillin resistance NOT DETECTED NOT DETECTED Final   Streptococcus species DETECTED (A) NOT DETECTED Final    Comment: CRITICAL RESULT CALLED TO, READ BACK BY AND VERIFIED WITH: G ABBOTT PHARMD 2300  06/02/16 A BROWNING    Streptococcus agalactiae DETECTED (A) NOT DETECTED Final    Comment: CRITICAL RESULT CALLED TO, READ BACK BY AND VERIFIED WITH: G ABBOTT PHARMD 2300 06/02/16 A BROWNING    Streptococcus pneumoniae NOT DETECTED NOT DETECTED Final   Streptococcus pyogenes NOT DETECTED NOT DETECTED Final   Acinetobacter baumannii NOT DETECTED NOT DETECTED Final   Enterobacteriaceae species NOT DETECTED NOT DETECTED Final   Enterobacter cloacae complex NOT DETECTED NOT DETECTED Final   Escherichia coli NOT DETECTED NOT DETECTED Final   Klebsiella oxytoca NOT DETECTED NOT DETECTED Final   Klebsiella pneumoniae NOT DETECTED NOT DETECTED Final   Proteus species NOT DETECTED NOT DETECTED Final   Serratia marcescens NOT DETECTED NOT DETECTED Final   Carbapenem resistance NOT DETECTED NOT DETECTED Final   Haemophilus influenzae NOT DETECTED NOT DETECTED Final   Neisseria meningitidis NOT DETECTED NOT DETECTED Final   Pseudomonas aeruginosa NOT DETECTED NOT DETECTED Final   Candida albicans NOT DETECTED NOT DETECTED Final   Candida glabrata NOT DETECTED NOT DETECTED Final   Candida krusei NOT DETECTED NOT DETECTED Final   Candida parapsilosis NOT DETECTED NOT DETECTED Final   Candida tropicalis NOT DETECTED NOT DETECTED Final    Comment: Performed at [facility]  Blood Culture (routine x 2)     Status: None (Preliminary result)   Collection Time: 06/01/16  7:54 PM  Result Value Ref Range Status   Specimen Description BLOOD LEFT HAND  Final   Special Requests BOTTLES DRAWN AEROBIC ONLY 6CC  Final   Culture NO GROWTH  3 DAYS  Final   Report Status PENDING  Incomplete  Urine culture     Status: Abnormal   Collection Time: 06/01/16 10:01 PM  Result Value Ref Range Status   Specimen Description URINE, CLEAN CATCH  Final   Special Requests NONE  Final   Culture >=100,000 COLONIES/mL ESCHERICHIA COLI (A)  Final   Report Status 06/04/2016 FINAL  Final   Organism ID, Bacteria  ESCHERICHIA COLI (A)  Final      Susceptibility   Escherichia coli - MIC*    AMPICILLIN >=32 RESISTANT Resistant     CEFAZOLIN 32 INTERMEDIATE Intermediate     CEFTRIAXONE <=1 SENSITIVE Sensitive     CIPROFLOXACIN <=0.25 SENSITIVE Sensitive     GENTAMICIN <=1 SENSITIVE Sensitive     IMIPENEM <=0.25 SENSITIVE Sensitive     NITROFURANTOIN <=16 SENSITIVE Sensitive     TRIMETH/SULFA <=20 SENSITIVE Sensitive     AMPICILLIN/SULBACTAM >=32 RESISTANT Resistant     PIP/TAZO 8 SENSITIVE Sensitive     Extended ESBL NEGATIVE Sensitive     * >=100,000 COLONIES/mL ESCHERICHIA COLI  MRSA PCR Screening     Status: None   Collection Time: 06/02/16  2:50 AM  Result Value Ref Range Status   MRSA by PCR NEGATIVE NEGATIVE Final    Comment:        The GeneXpert MRSA Assay (FDA approved for NASAL specimens only), is one component of a comprehensive MRSA colonization surveillance program. It is not intended to diagnose MRSA infection nor to guide or monitor treatment for MRSA infections.   Culture, blood (Routine X 2) w Reflex to ID Panel     Status: None (Preliminary result)   Collection Time: 06/02/16  6:55 PM  Result Value Ref Range Status   Specimen Description BLOOD LEFT HAND  Final   Special Requests BOTTLES DRAWN AEROBIC ONLY 5CC  Final   Culture NO GROWTH < 24 HOURS  Final   Report Status PENDING  Incomplete  Culture, blood (Routine X 2) w Reflex to ID Panel     Status: None (Preliminary result)   Collection Time: 06/02/16  7:05 PM  Result Value Ref Range Status   Specimen Description BLOOD LEFT ANTECUBITAL  Final   Special Requests BOTTLES DRAWN AEROBIC AND ANAEROBIC 10 CC EA  Final   Culture NO GROWTH < 24 HOURS  Final   Report Status PENDING  Incomplete      Radiology Studies: No results found.   Scheduled Meds: . [MAR Hold] anastrozole  1 mg Oral Daily  . [MAR Hold] cefTRIAXone (ROCEPHIN)  IV  2 g Intravenous Q24H  . [MAR Hold] dronedarone  400 mg Oral BID WC  . [MAR Hold]  Influenza vac split quadrivalent PF  0.5 mL Intramuscular Tomorrow-1000  . [MAR Hold] insulin aspart  0-5 Units Subcutaneous QHS  . [MAR Hold] insulin aspart  0-9 Units Subcutaneous TID WC  . [MAR Hold] insulin glargine  16 Units Subcutaneous QHS  . [MAR Hold] losartan  100 mg Oral Daily  . [MAR Hold] multivitamin  2 tablet Oral Daily  . [MAR Hold] oxybutynin  10 mg Oral QHS  . [MAR Hold] pantoprazole  40 mg Oral Daily  . [MAR Hold] sodium chloride flush  3 mL Intravenous Q12H   Continuous Infusions: . sodium chloride 125 mL/hr at 06/04/16 0542  . sodium chloride    . heparin 900 Units/hr (06/04/16 0542)     LOS: 3 days   Time Spent in minutes   30 minutes  Kristion Holifield D.O. on 06/04/2016 at 9:42 AM  Between 7am to 7pm - Pager - [phone]  After 7pm go to [email or web address] - password [email or web address]  And look for the night coverage person covering for me after hours  [facility] Office  [phone]

## 2016-06-04 NOTE — Progress Notes (Signed)
PROGRESS NOTE    Toni Hanna  [id number] DOB: 1936/08/27 DOA: 06/01/2016 PCP: Robert Bellow, MD   Chief Complaint  Patient presents with  . Knee Pain  . Hyperglycemia     Brief Narrative:  HPI on 06/01/2016 by Dr. Jackquline Berlin a 80 y.o.femalewith medical history significant of breast cancer, history of right breast lumpectomy, CAD, type 2 diabetes, hypertension who is coming to the emergency department due to left knee pain. Per patient, she started having left knee pain on Friday which continuedto get worse through the weekend. She had a left knee arthroplasty in [place] about 10 years ago. She states that she fell on the floor on Sunday evening due to weakness and was unable to get up from the floor, so she decided to go to sleep. This morning, when she woke up, she crawled to the other room to reach her phone, madea phone call for help and was subsequently brought to the emergency department. She estimates that she spent about 12 hours on the floor. She denies fever, but complains of chills, fatigue, generalized weakness, decreased appetite. Dr. Sabra Heck from the emergency department and spoke to Dr. Swintek(orthopedic surgery) who suggested transfer to [facility] for further evaluation and treatment.   Assessment & Plan   Sepsis secondary to septic arthritis left knee/Strep bacteremia -Upon admission, patient had mild leukocytosis, elevated lactic acid, soft BP.  She has been tachycardic and tachypneic.  -Fluid culture GPC in chains, culture strep agalactaie  -Blood cultures from 06/01/2016 show Strep agalactaie in 1/2 bottles -Initially placed on on Aztreonam, vanco  -Repeat Blood cultures from 06/02/2016 show no growth to date -ID consulted and appreciated.  -Echocardiogram: EF 30-35%.  Spoke with hospitalist in [place], who states her EF was 50-55% in June 2017.  -Orthopedics consulted and appreciated- plan for resection arthroplasty, antibiotic  spacer -Patient currently on ceftriaxone (given her PCN allergy- will watch and monitor very closely) -Cardiology consulted and appreciated, plan for TEE today  New systolic CHF -Echo as above -Cardiology consulted and appreciated -Continue current medications -Currently euvolemic  -monitor intake/output, daily weights  Sepsis secondary to right breast cellulitis and ?UTI -Continue current IV antibiotic treatment as above -Urine culture: Ecoli -Erythema of the right breast appears to be improving  Uncontrolled type 2 diabetes mellitus -Hold metformin, glipizide  -emoglobin A1c 10.4 -Continue Lantus, ISS, CBG monitoring   Hypokalemia/ hypomagnesemia -Replaced, continue to monitor   Hypophosphatemia -Resolved with replacement  A Fib on coumadin -CHADSVASC 5 (age, gender, DM, HTN) -Rate controlled -Metoprolol held due to low BP  -Coumadin held. Started on heparin -INR 2 -Given Vit K 2.5mg  orally.  Continue to watch INR.  Hypertension -Metoprolol held due to low BP   Lactic acidosis -Resolved with IVF   History of breast cancer -Continue Arimidex   DVT Prophylaxis  Coumadin  Code Status: Full  Family Communication: None at bedside.  Spoke with Son and daughter-in-law via phone.    Disposition Plan: Admitted. Pending TEE today and surgery 10/6 Patient's family would like patient transferred to [facility]. Patient was accepted in transfer, however, currently no beds available.   Consultants Orthopedics Infectious disease Cardiology  Procedures  None  Antibiotics   Anti-infectives    Start     Dose/Rate Route Frequency Ordered Stop   06/03/16 1800  [MAR Hold]  cefTRIAXone (ROCEPHIN) 2 g in dextrose 5 % 50 mL IVPB     (MAR Hold since 06/04/16 0921)  2 g 100 mL/hr over 30 Minutes Intravenous Every 24 hours 06/03/16 1613     06/03/16 1230  ceFAZolin (ANCEF) IVPB 2g/100 mL premix  Status:  Discontinued     2 g 200 mL/hr over 30  Minutes Intravenous Every 8 hours 06/03/16 1132 06/03/16 1613   06/02/16 1800  vancomycin (VANCOCIN) 1,250 mg in sodium chloride 0.9 % 250 mL IVPB  Status:  Discontinued     1,250 mg 166.7 mL/hr over 90 Minutes Intravenous Every 24 hours 06/01/16 1910 06/03/16 1132   06/02/16 0300  aztreonam (AZACTAM) 2 g in dextrose 5 % 50 mL IVPB  Status:  Discontinued     2 g 100 mL/hr over 30 Minutes Intravenous Every 8 hours 06/01/16 1910 06/02/16 1711   06/01/16 1915  vancomycin (VANCOCIN) 1,500 mg in sodium chloride 0.9 % 500 mL IVPB     1,500 mg 250 mL/hr over 120 Minutes Intravenous  Once 06/01/16 1901 06/01/16 2249   06/01/16 1900  aztreonam (AZACTAM) 2 g in dextrose 5 % 50 mL IVPB     2 g 100 mL/hr over 30 Minutes Intravenous  Once 06/01/16 1850 06/01/16 2035   06/01/16 1900  vancomycin (VANCOCIN) IVPB 1000 mg/200 mL premix  Status:  Discontinued     1,000 mg 200 mL/hr over 60 Minutes Intravenous  Once 06/01/16 1850 06/01/16 1901      Subjective:   Toni Hanna seen and examined today.  Patient complains of some knee pain, 8/10, worsens with activity or movement.  Denies chest pain, shortness of breath, abdominal pain, nausea, vomiting, diarrhea, constipation.    Objective:   Vitals:   06/04/16 0537 06/04/16 0817 06/04/16 0832 06/04/16 0929  BP: (!) 145/63 (!) 127/57 (!) 127/57 (!) 150/64  Pulse: 88 86 93 95  Resp:  17 20 (!) 27  Temp: (!) 100.6 F (38.1 C)  99.8 F (37.7 C) 99.7 F (37.6 C)  TempSrc: Oral  Oral Oral  SpO2:  95% 97% 98%  Weight:      Height:        Intake/Output Summary (Last 24 hours) at 06/04/16 0942 Last data filed at 06/04/16 0600  Gross per 24 hour  Intake           3692.8 ml  Output                4 ml  Net           3688.8 ml   Filed Weights   06/01/16 1509  Weight: 82.6 kg (182 lb)    Exam  General: Well developed, well nourished, NAD, appears stated age  HEENT: NCAT,  mucous membranes moist.   Neck: Supple, no JVD, no masses  Cardiovascular:  S1 S2 auscultated, 2/6 SEM, RRR  Respiratory: Clear to auscultation bilaterally with equal chest rise  Abdomen: Soft, nontender, nondistended, + bowel sounds  Extremities: warm dry without cyanosis clubbing or edema. Left knee/leg swelling, mild pain with ROM  Neuro: AAOx3, nonfocal  Skin: Small area of erythema on right breast-resolved  Psych: Normal affect and demeanor, pleasant    Data Reviewed: I have personally reviewed following labs and imaging studies  CBC:  Recent Labs Lab 06/01/16 1551 06/01/16 2306 06/02/16 0504 06/03/16 0212 06/04/16 0338  WBC 11.5* 8.9 7.5 6.1 7.2  NEUTROABS 9.0* 6.8 5.5 4.5 5.1  HGB 14.2 12.7 11.5* 10.8* 10.4*  HCT 41.1 37.0 34.8* 32.7* 32.5*  MCV 85.3 84.7 85.1 86.3 86.9  PLT 115* 121* 113* 114* 128*  Basic Metabolic Panel:  Recent Labs Lab 06/01/16 1551 06/01/16 2306 06/02/16 0504 06/03/16 0212 06/04/16 0338  NA 124* 132* 131* 132* 133*  K 4.1 3.5 3.3* 3.0* 3.3*  CL 90* 101 100* 103 101  CO2 21* 24 23 22 22   GLUCOSE 559* 169* 141* 212* 199*  BUN 25* 26* 25* 16 8  CREATININE 1.35* 0.95 0.90 0.67 0.58  CALCIUM 8.8* 8.1* 8.0* 7.7* 8.0*  MG 1.9  --  1.9 1.6* 1.8  PHOS 1.9*  --  1.2* 1.8*  --    GFR: Estimated Creatinine Clearance: 59 mL/min (by C-G formula based on SCr of 0.58 mg/dL). Liver Function Tests:  Recent Labs Lab 06/01/16 2306 06/02/16 0504 06/03/16 0212 06/04/16 0338  AST 28 23 17 22   ALT 21 18 17 19   ALKPHOS 63 56 55 79  BILITOT 1.1 1.2 1.3* 0.8  PROT 5.4* 4.7* 4.7* 4.9*  ALBUMIN 2.7* 2.3* 2.1* 2.1*   No results for input(s): LIPASE, AMYLASE in the last 168 hours. No results for input(s): AMMONIA in the last 168 hours. Coagulation Profile:  Recent Labs Lab 06/01/16 2306 06/03/16 0212 06/04/16 0338  INR 3.15 2.18 2.00   Cardiac Enzymes:  Recent Labs Lab 06/01/16 1605  CKTOTAL 457*   BNP (last 3 results) No results for input(s): PROBNP in the last 8760 hours. HbA1C:  Recent Labs   06/02/16 1245  HGBA1C 10.4*   CBG:  Recent Labs Lab 06/03/16 1234 06/03/16 1848 06/03/16 2206 06/04/16 0831 06/04/16 0928  GLUCAP 244* 244* 240* 163* 171*   Lipid Profile: No results for input(s): CHOL, HDL, LDLCALC, TRIG, CHOLHDL, LDLDIRECT in the last 72 hours. Thyroid Function Tests: No results for input(s): TSH, T4TOTAL, FREET4, T3FREE, THYROIDAB in the last 72 hours. Anemia Panel: No results for input(s): VITAMINB12, FOLATE, FERRITIN, TIBC, IRON, RETICCTPCT in the last 72 hours. Urine analysis:    Component Value Date/Time   COLORURINE YELLOW 06/01/2016 2201   APPEARANCEUR HAZY (A) 06/01/2016 2201   LABSPEC 1.025 06/01/2016 2201   PHURINE 5.5 06/01/2016 2201   GLUCOSEU >1000 (A) 06/01/2016 2201   HGBUR MODERATE (A) 06/01/2016 2201   BILIRUBINUR NEGATIVE 06/01/2016 2201   KETONESUR NEGATIVE 06/01/2016 2201   PROTEINUR 30 (A) 06/01/2016 2201   NITRITE NEGATIVE 06/01/2016 2201   LEUKOCYTESUR SMALL (A) 06/01/2016 2201   Sepsis Labs: @LABRCNTIP (procalcitonin:4,lacticidven:4)  ) Recent Results (from the past 240 hour(s))  Gram stain     Status: None   Collection Time: 06/01/16  4:50 PM  Result Value Ref Range Status   Specimen Description FLUID SYNOVIAL LEFT KNEE  Final   Special Requests NONE  Final   Gram Stain   Final    ABUNDANT WBC PRESENT, PREDOMINANTLY PMN RARE INTRACELLULAR GRAM POSITIVE COCCI Gram Stain Report Called to,Read Back By and Verified With: EDWARDS,C. AT 1804 ON 06/01/2016 BY BAUGHAM,M.    Report Status 06/01/2016 FINAL  Final  Culture, body fluid-bottle     Status: Abnormal (Preliminary result)   Collection Time: 06/01/16  4:50 PM  Result Value Ref Range Status   Specimen Description FLUID SYNOVIAL LEFT KNEE  Final   Special Requests BOTTLES DRAWN AEROBIC AND ANAEROBIC 3CC EACH  Final   Gram Stain   Final    GRAM POSITIVE COCCI IN CHAINS IN BOTH AEROBIC AND ANAEROBIC BOTTLES Gram Stain Report Called to,Read Back By and Verified With: REAP  B AT [place] AT 0600 ON [id number] BY FORSYTH K Performed at [place] B  STREP(S.AGALACTIAE)ISOLATED (A)  Final   Report Status PENDING  Incomplete  Blood Culture (routine x 2)     Status: Abnormal   Collection Time: 06/01/16  7:42 PM  Result Value Ref Range Status   Specimen Description BLOOD LEFT HAND  Final   Special Requests BOTTLES DRAWN AEROBIC ONLY [place]  Final   Culture  Setup Time   Final    GRAM POSITIVE COCCI IN CHAINS RECOVERED FROM THE AEROBIC BOTTLE Gram Stain Report Called to,Read Back By and Verified With: AGUIRRE,A. AT [id number] ON 06/02/2016 BY BAUGHAM,M. Performed at [place] ID to follow CRITICAL RESULT CALLED TO, READ BACK BY AND VERIFIED WITH: G ABBOTT PHARMD 2300 06/02/16 A BROWNING Performed at [place] B STREP(S.AGALACTIAE)ISOLATED (A)  Final   Report Status 06/04/2016 FINAL  Final   Organism ID, Bacteria GROUP B STREP(S.AGALACTIAE)ISOLATED  Final      Susceptibility   Group b strep(s.agalactiae)isolated - MIC*    CLINDAMYCIN >=1 RESISTANT Resistant     AMPICILLIN <=0.25 SENSITIVE Sensitive     ERYTHROMYCIN >=8 RESISTANT Resistant     VANCOMYCIN 0.5 SENSITIVE Sensitive     CEFTRIAXONE <=0.12 SENSITIVE Sensitive     LEVOFLOXACIN 1 SENSITIVE Sensitive     * GROUP B STREP(S.AGALACTIAE)ISOLATED  Blood Culture ID Panel (Reflexed)     Status: Abnormal   Collection Time: 06/01/16  7:42 PM  Result Value Ref Range Status   Enterococcus species NOT DETECTED NOT DETECTED Final   Vancomycin resistance NOT DETECTED NOT DETECTED Final   Listeria monocytogenes NOT DETECTED NOT DETECTED Final   Staphylococcus species NOT DETECTED NOT DETECTED Final   Staphylococcus aureus NOT DETECTED NOT DETECTED Final   Methicillin resistance NOT DETECTED NOT DETECTED Final   Streptococcus species DETECTED (A) NOT DETECTED Final    Comment: CRITICAL RESULT CALLED TO, READ BACK BY AND VERIFIED WITH: G ABBOTT PHARMD 2300  06/02/16 A BROWNING    Streptococcus agalactiae DETECTED (A) NOT DETECTED Final    Comment: CRITICAL RESULT CALLED TO, READ BACK BY AND VERIFIED WITH: G ABBOTT PHARMD 2300 06/02/16 A BROWNING    Streptococcus pneumoniae NOT DETECTED NOT DETECTED Final   Streptococcus pyogenes NOT DETECTED NOT DETECTED Final   Acinetobacter baumannii NOT DETECTED NOT DETECTED Final   Enterobacteriaceae species NOT DETECTED NOT DETECTED Final   Enterobacter cloacae complex NOT DETECTED NOT DETECTED Final   Escherichia coli NOT DETECTED NOT DETECTED Final   Klebsiella oxytoca NOT DETECTED NOT DETECTED Final   Klebsiella pneumoniae NOT DETECTED NOT DETECTED Final   Proteus species NOT DETECTED NOT DETECTED Final   Serratia marcescens NOT DETECTED NOT DETECTED Final   Carbapenem resistance NOT DETECTED NOT DETECTED Final   Haemophilus influenzae NOT DETECTED NOT DETECTED Final   Neisseria meningitidis NOT DETECTED NOT DETECTED Final   Pseudomonas aeruginosa NOT DETECTED NOT DETECTED Final   Candida albicans NOT DETECTED NOT DETECTED Final   Candida glabrata NOT DETECTED NOT DETECTED Final   Candida krusei NOT DETECTED NOT DETECTED Final   Candida parapsilosis NOT DETECTED NOT DETECTED Final   Candida tropicalis NOT DETECTED NOT DETECTED Final    Comment: Performed at [facility]  Blood Culture (routine x 2)     Status: None (Preliminary result)   Collection Time: 06/01/16  7:54 PM  Result Value Ref Range Status   Specimen Description BLOOD LEFT HAND  Final   Special Requests BOTTLES DRAWN AEROBIC ONLY 6CC  Final   Culture NO GROWTH  3 DAYS  Final   Report Status PENDING  Incomplete  Urine culture     Status: Abnormal   Collection Time: 06/01/16 10:01 PM  Result Value Ref Range Status   Specimen Description URINE, CLEAN CATCH  Final   Special Requests NONE  Final   Culture >=100,000 COLONIES/mL ESCHERICHIA COLI (A)  Final   Report Status 06/04/2016 FINAL  Final   Organism ID, Bacteria  ESCHERICHIA COLI (A)  Final      Susceptibility   Escherichia coli - MIC*    AMPICILLIN >=32 RESISTANT Resistant     CEFAZOLIN 32 INTERMEDIATE Intermediate     CEFTRIAXONE <=1 SENSITIVE Sensitive     CIPROFLOXACIN <=0.25 SENSITIVE Sensitive     GENTAMICIN <=1 SENSITIVE Sensitive     IMIPENEM <=0.25 SENSITIVE Sensitive     NITROFURANTOIN <=16 SENSITIVE Sensitive     TRIMETH/SULFA <=20 SENSITIVE Sensitive     AMPICILLIN/SULBACTAM >=32 RESISTANT Resistant     PIP/TAZO 8 SENSITIVE Sensitive     Extended ESBL NEGATIVE Sensitive     * >=100,000 COLONIES/mL ESCHERICHIA COLI  MRSA PCR Screening     Status: None   Collection Time: 06/02/16  2:50 AM  Result Value Ref Range Status   MRSA by PCR NEGATIVE NEGATIVE Final    Comment:        The GeneXpert MRSA Assay (FDA approved for NASAL specimens only), is one component of a comprehensive MRSA colonization surveillance program. It is not intended to diagnose MRSA infection nor to guide or monitor treatment for MRSA infections.   Culture, blood (Routine X 2) w Reflex to ID Panel     Status: None (Preliminary result)   Collection Time: 06/02/16  6:55 PM  Result Value Ref Range Status   Specimen Description BLOOD LEFT HAND  Final   Special Requests BOTTLES DRAWN AEROBIC ONLY 5CC  Final   Culture NO GROWTH < 24 HOURS  Final   Report Status PENDING  Incomplete  Culture, blood (Routine X 2) w Reflex to ID Panel     Status: None (Preliminary result)   Collection Time: 06/02/16  7:05 PM  Result Value Ref Range Status   Specimen Description BLOOD LEFT ANTECUBITAL  Final   Special Requests BOTTLES DRAWN AEROBIC AND ANAEROBIC 10 CC EA  Final   Culture NO GROWTH < 24 HOURS  Final   Report Status PENDING  Incomplete      Radiology Studies: No results found.   Scheduled Meds: . [MAR Hold] anastrozole  1 mg Oral Daily  . [MAR Hold] cefTRIAXone (ROCEPHIN)  IV  2 g Intravenous Q24H  . [MAR Hold] dronedarone  400 mg Oral BID WC  . [MAR Hold]  Influenza vac split quadrivalent PF  0.5 mL Intramuscular Tomorrow-1000  . [MAR Hold] insulin aspart  0-5 Units Subcutaneous QHS  . [MAR Hold] insulin aspart  0-9 Units Subcutaneous TID WC  . [MAR Hold] insulin glargine  16 Units Subcutaneous QHS  . [MAR Hold] losartan  100 mg Oral Daily  . [MAR Hold] multivitamin  2 tablet Oral Daily  . [MAR Hold] oxybutynin  10 mg Oral QHS  . [MAR Hold] pantoprazole  40 mg Oral Daily  . [MAR Hold] sodium chloride flush  3 mL Intravenous Q12H   Continuous Infusions: . sodium chloride 125 mL/hr at 06/04/16 0542  . sodium chloride    . heparin 900 Units/hr (06/04/16 0542)     LOS: 3 days   Time Spent in minutes   30 minutes  Rehmat Murtagh D.O. on 06/04/2016 at 9:42 AM  Between 7am to 7pm - Pager - [phone]  After 7pm go to [email or web address] - password [email or web address]  And look for the night coverage person covering for me after hours  [facility] Office  [phone]

## 2016-06-04 NOTE — Interval H&P Note (Signed)
History and Physical Interval Note:  06/04/2016 9:51 AM  Toni Hanna  has presented today for surgery, with the diagnosis of endocarditis  The various methods of treatment have been discussed with the patient and family. After consideration of risks, benefits and other options for treatment, the patient has consented to  Procedure(s): TRANSESOPHAGEAL ECHOCARDIOGRAM (TEE) (N/A) as a surgical intervention .  The patient's history has been reviewed, patient examined, no change in status, stable for surgery.  I have reviewed the patient's chart and labs.  Questions were answered to the patient's satisfaction.     Kirk Ruths

## 2016-06-04 NOTE — Care Management Important Message (Signed)
Important Message  Patient Details  Name: Toni Hanna MRN: [id number] Date of Birth: [birth year]   Medicare Important Message Given:  Yes    [name] 06/04/2016, 9:47 AM

## 2016-06-04 NOTE — Progress Notes (Signed)
INR today 2.0 Hgb 10.4  GBS L knee PJI Needs resection arthroplasty, placement of abx spacer NPO after MN tonight D/C heparin gtt at MN tonight and recheck PT/INR in am Plan for surgery tomorrow am if INR acceptable

## 2016-06-04 NOTE — Progress Notes (Signed)
Pharmacy Antibiotic Note  Toni Hanna is a 80 y.o. female admitted on 06/01/2016 with Group B Strep endocarditis.  Pharmacy has been consulted for gentamicin synergy dosing. Goal peak 3-4, trough <1. Patient is currently on ceftriaxone.  Plan: - Ceftriaxone 2g IV q24h - Start gentamicin 60 mg IV q12h  - Will need to check dose at steady state (30 min prior to 4th dose) - Monitor renal function, drug levels and length of therapy  Height: 5\' 3"  (160 cm) Weight: 182 lb (82.6 kg) IBW/kg (Calculated) : 52.4  Temp (24hrs), Avg:99.7 F (37.6 C), Min:99 F (37.2 C), Max:100.6 F (38.1 C)   Recent Labs Lab 06/01/16 1551 06/01/16 1954 06/01/16 2018 06/01/16 2306 06/02/16 0503 06/02/16 0504 06/02/16 0716 06/03/16 0212 06/04/16 0338  WBC 11.5*  --   --  8.9  --  7.5  --  6.1 7.2  CREATININE 1.35*  --   --  0.95  --  0.90  --  0.67 0.58  LATICACIDVEN  --  3.9* 5.41*  --  2.0*  --  1.7  --   --     Estimated Creatinine Clearance: 59 mL/min (by C-G formula based on SCr of 0.58 mg/dL).    Allergies  Allergen Reactions  . Amoxicillin Swelling  . Penicillins Swelling    Has patient had a PCN reaction causing immediate rash, facial/tongue/throat swelling, SOB or lightheadedness with hypotension: Yes Has patient had a PCN reaction causing severe rash involving mucus membranes or skin necrosis: No Has patient had a PCN reaction that required hospitalization No Has patient had a PCN reaction occurring within the last 10 years: No If all of the above answers are "NO", then may proceed with Cephalosporin use.  Tolerated ceftriaxone     Antimicrobials this admission: Vancomycin 10/2 >> 10/4 Aztreonam 10/2 >> 10/4 Cefazolin 10/4>>10/4 Ceftriaxone 10/4>> Gentamicin 10/5>>  Microbiology results:  10/2 Synovial fluid (L knee) Cx - GBS (S-CTX)  10/2 UCx - >100 K E coli 10/2 BCx - GBS (1/2) (S-CTX) 10/3 BCx - ngtd  Thank you for allowing pharmacy to be a part of this patient's  care.  Dimitri Ped, PharmD. PGY-2 Infectious Diseases Pharmacy Resident Pager: [phone] 06/04/2016 3:22 PM

## 2016-06-05 DIAGNOSIS — R739 Hyperglycemia, unspecified: Secondary | ICD-10-CM

## 2016-06-05 DIAGNOSIS — Z95828 Presence of other vascular implants and grafts: Secondary | ICD-10-CM

## 2016-06-05 LAB — CBC
HCT: 31.5 % — ABNORMAL LOW (ref 36.0–46.0)
Hemoglobin: 10.2 g/dL — ABNORMAL LOW (ref 12.0–15.0)
MCH: 28.1 pg (ref 26.0–34.0)
MCHC: 32.4 g/dL (ref 30.0–36.0)
MCV: 86.8 fL (ref 78.0–100.0)
PLATELETS: 147 10*3/uL — AB (ref 150–400)
RBC: 3.63 MIL/uL — ABNORMAL LOW (ref 3.87–5.11)
RDW: 15.3 % (ref 11.5–15.5)
WBC: 6.9 10*3/uL (ref 4.0–10.5)

## 2016-06-05 LAB — GLUCOSE, CAPILLARY
GLUCOSE-CAPILLARY: 203 mg/dL — AB (ref 65–99)
Glucose-Capillary: 193 mg/dL — ABNORMAL HIGH (ref 65–99)
Glucose-Capillary: 186 mg/dL — ABNORMAL HIGH (ref 65–99)
Glucose-Capillary: 217 mg/dL — ABNORMAL HIGH (ref 65–99)

## 2016-06-05 LAB — BASIC METABOLIC PANEL
ANION GAP: 12 (ref 5–15)
BUN: 6 mg/dL (ref 6–20)
CALCIUM: 8.5 mg/dL — AB (ref 8.9–10.3)
CO2: 23 mmol/L (ref 22–32)
Chloride: 100 mmol/L — ABNORMAL LOW (ref 101–111)
Creatinine, Ser: 0.55 mg/dL (ref 0.44–1.00)
GLUCOSE: 212 mg/dL — AB (ref 65–99)
Potassium: 3.6 mmol/L (ref 3.5–5.1)
Sodium: 135 mmol/L (ref 135–145)

## 2016-06-05 LAB — PROTIME-INR
INR: 1.7
INR: 1.61
INR: 1.5
Prothrombin Time: 20.2 seconds — ABNORMAL HIGH (ref 11.4–15.2)
Prothrombin Time: 19.3 seconds — ABNORMAL HIGH (ref 11.4–15.2)
Prothrombin Time: 18.3 seconds — ABNORMAL HIGH (ref 11.4–15.2)

## 2016-06-05 LAB — HEPARIN LEVEL (UNFRACTIONATED)
Heparin Unfractionated: 0.16 IU/mL — ABNORMAL LOW (ref 0.30–0.70)
Heparin Unfractionated: 0.36 IU/mL (ref 0.30–0.70)

## 2016-06-05 MED ORDER — WHITE PETROLATUM GEL
Status: AC
  Administered 2016-06-05: 1
  Filled 2016-06-05: qty 1

## 2016-06-05 MED ORDER — INSULIN GLARGINE 100 UNIT/ML ~~LOC~~ SOLN
18.0000 [IU] | Freq: Every day | SUBCUTANEOUS | Status: DC
  Administered 2016-06-05 – 2016-06-10 (×6): 18 [IU] via SUBCUTANEOUS
  Filled 2016-06-05 (×8): qty 0.18

## 2016-06-05 MED ORDER — HEPARIN (PORCINE) IN NACL 100-0.45 UNIT/ML-% IJ SOLN
1600.0000 [IU]/h | INTRAMUSCULAR | Status: DC
  Administered 2016-06-05: 1450 [IU]/h via INTRAVENOUS
  Filled 2016-06-05: qty 250

## 2016-06-05 MED ORDER — METOPROLOL SUCCINATE ER 25 MG PO TB24
25.0000 mg | ORAL_TABLET | Freq: Every day | ORAL | Status: DC
  Administered 2016-06-05 – 2016-06-06 (×2): 25 mg via ORAL
  Filled 2016-06-05 (×3): qty 1

## 2016-06-05 MED ORDER — TRANEXAMIC ACID 1000 MG/10ML IV SOLN
1000.0000 mg | INTRAVENOUS | Status: DC
  Filled 2016-06-05: qty 10

## 2016-06-05 NOTE — Progress Notes (Signed)
ANTICOAGULATION CONSULT NOTE - Follow Up Consult  Pharmacy Consult:  Heparin Indication: atrial fibrillation  Allergies  Allergen Reactions  . Amoxicillin Swelling  . Penicillins Swelling    Has patient had a PCN reaction causing immediate rash, facial/tongue/throat swelling, SOB or lightheadedness with hypotension: Yes Has patient had a PCN reaction causing severe rash involving mucus membranes or skin necrosis: No Has patient had a PCN reaction that required hospitalization No Has patient had a PCN reaction occurring within the last 10 years: No If all of the above answers are "NO", then may proceed with Cephalosporin use.  Tolerated ceftriaxone     Patient Measurements: Height: 5\' 3"  (160 cm) Weight: 182 lb (82.6 kg) IBW/kg (Calculated) : 52.4 Heparin Dosing Weight: 71 kg  Vital Signs: Temp: 98.7 F (37.1 C) (10/06 1236) Temp Source: Oral (10/06 1236) BP: 144/74 (10/06 1236) Pulse Rate: 96 (10/06 0957)  Labs:  Recent Labs  06/03/16 0212  06/04/16 0338 06/04/16 1316 06/05/16 0304 06/05/16 0500 06/05/16 1338  HGB 10.8*  --  10.4*  --   --  10.2*  --   HCT 32.7*  --  32.5*  --   --  31.5*  --   PLT 114*  --  128*  --   --  147*  --   LABPROT 24.6*  --  23.0*  --  20.2* 19.3* 18.3*  INR 2.18  --  2.00  --  1.70 1.61 1.50  HEPARINUNFRC  --   < > <0.10* 0.11* 0.16*  --  0.36  CREATININE 0.67  --  0.58  --   --  0.55  --   < > = values in this interval not displayed.  Estimated Creatinine Clearance: 59 mL/min (by C-G formula based on SCr of 0.55 mg/dL).     Assessment: 57 YOF with history of PAF and mitral valve repair with MAZE procedure on Coumadin PTA.  Patient was reversed with Vitamin K for surgery that is now postponed until 06/08/16.  Pharmacy managing IV heparin bridge while Coumadin is on hold.  Heparin level is therapeutic; no bleeding reported.   Goal of Therapy:  Heparin level 0.3-0.7 units/ml Monitor platelets by anticoagulation protocol: Yes     Plan:  - Increase heparin gtt slightly to 1450 units/hr - Daily heparin level and CBC   Hagan Maltz D. Mina Marble, PharmD, BCPS Pager:  [phone] 06/05/2016, 2:26 PM

## 2016-06-05 NOTE — Progress Notes (Signed)
Patient Name: Toni Hanna Date of Encounter: 06/05/2016  Primary Cardiologist: New (see's MD in [facility])  [facility] Problem List     Principal Problem:   Pyogenic arthritis of left knee joint [facility]) Active Problems:   Uncontrolled type 2 diabetes mellitus ([place])   CAD (coronary artery disease)   Hypertension   UTI (urinary tract infection)   Hypophosphatemia   History of breast cancer   Sepsis ([place])   Cellulitis of breast   Group B streptococcal bacteriuria   Group B streptococcal infection   Sepsis due to group B Streptococcus (HCC)   Acute on chronic combined systolic and diastolic congestive heart failure (HCC)   Acute bacterial endocarditis   Prosthetic joint infection, subsequent encounter     Subjective   No CP, no SOB. Awaiting surg monday  Inpatient Medications    Scheduled Meds: . acetaminophen  1,000 mg Intravenous To OR  . anastrozole  1 mg Oral Daily  . cefTRIAXone (ROCEPHIN)  IV  2 g Intravenous Q24H  . chlorhexidine  60 mL Topical Once  . dronedarone  400 mg Oral BID WC  . gentamicin  60 mg Intravenous Q12H  . Influenza vac split quadrivalent PF  0.5 mL Intramuscular Tomorrow-1000  . insulin aspart  0-5 Units Subcutaneous QHS  . insulin aspart  0-9 Units Subcutaneous TID WC  . insulin glargine  16 Units Subcutaneous QHS  . losartan  100 mg Oral Daily  . multivitamin  2 tablet Oral Daily  . oxybutynin  10 mg Oral QHS  . pantoprazole  40 mg Oral Daily  . povidone-iodine  2 application Topical Once  . sodium chloride flush  10-40 mL Intracatheter Q12H  . sodium chloride flush  3 mL Intravenous Q12H  . tranexamic acid  1,000 mg Intravenous To OR   Continuous Infusions: . sodium chloride 125 mL/hr at 06/05/16 0050  . heparin 1,400 Units/hr (06/05/16 0400)   PRN Meds:.acetaminophen, dextrose, HYDROcodone-acetaminophen, loperamide, morphine injection, sodium chloride flush, zolpidem   Vital Signs    Vitals:   06/04/16 2331 06/05/16 0400  06/05/16 0757 06/05/16 0800  BP: (!) 150/79 (!) 158/79 (!) 169/85 (!) 171/75  Pulse: 93 90 93 92  Resp: 16 (!) 26 (!) 25 16  Temp: 99.6 F (37.6 C) 100.3 F (37.9 C) 99.4 F (37.4 C)   TempSrc: Oral Oral Oral   SpO2: 96% 95% 98% 96%  Weight:      Height:        Intake/Output Summary (Last 24 hours) at 06/05/16 0943 Last data filed at 06/05/16 0815  Gross per 24 hour  Intake           3467.3 ml  Output              500 ml  Net           2967.3 ml   Filed Weights   06/01/16 1509  Weight: 182 lb (82.6 kg)    Physical Exam    GEN: Well nourished, well developed, in no acute distress.  HEENT: Grossly normal.  Neck: Supple, no JVD, carotid bruits, or masses. Cardiac: RRR, no murmurs, rubs, or gallops. No clubbing, cyanosis, mild LLE> RLE edema.  Radials/DP/PT 2+ and equal bilaterally.  Respiratory:  Respirations regular and unlabored, clear to auscultation bilaterally. GI: Soft, nontender, nondistended, BS + x 4. MS: no deformity or atrophy. Skin: warm and dry, no rash, warmth of left prosthetic knee Neuro:  Strength and sensation are intact. Psych:  AAOx3.  Normal affect.  Labs    CBC  Recent Labs  06/03/16 0212 06/04/16 0338 06/05/16 0500  WBC 6.1 7.2 6.9  NEUTROABS 4.5 5.1  --   HGB 10.8* 10.4* 10.2*  HCT 32.7* 32.5* 31.5*  MCV 86.3 86.9 86.8  PLT 114* 128* Q000111Q*   Basic Metabolic Panel  Recent Labs  06/03/16 0212 06/04/16 0338 06/05/16 0500  NA 132* 133* 135  K 3.0* 3.3* 3.6  CL 103 101 100*  CO2 22 22 23   GLUCOSE 212* 199* 212*  BUN 16 8 6   CREATININE 0.67 0.58 0.55  CALCIUM 7.7* 8.0* 8.5*  MG 1.6* 1.8  --   PHOS 1.8*  --   --    Liver Function Tests  Recent Labs  06/03/16 0212 06/04/16 0338  AST 17 22  ALT 17 19  ALKPHOS 55 79  BILITOT 1.3* 0.8  PROT 4.7* 4.9*  ALBUMIN 2.1* 2.1*   No results for input(s): LIPASE, AMYLASE in the last 72 hours. Cardiac Enzymes No results for input(s): CKTOTAL, CKMB, CKMBINDEX, TROPONINI in the last  72 hours. BNP Invalid input(s): POCBNP D-Dimer No results for input(s): DDIMER in the last 72 hours. Hemoglobin A1C  Recent Labs  06/02/16 1245  HGBA1C 10.4*    Telemetry    NSR, PAC's (does not appear to be AFIB) - Personally Reviewed  ECG    NSR, IVCD - Personally Reviewed  Radiology    Dg Chest Port 1 View  Result Date: 06/04/2016 CLINICAL DATA:  Left-sided PICC line insertion EXAM: PORTABLE CHEST 1 VIEW COMPARISON:  06/01/2016 FINDINGS: Left-sided PICC line terminates in the distal SVC. Heart is enlarged. There is atelectasis at each lung base and lingula. Subtle confluent pulmonary opacities in the right lung base cannot exclude a small pneumonic consolidation. Aortic atherosclerosis at its arch. Cardiac valvular prosthetic with median sternotomy sutures are again noted. Surgical clips project over the right mid hemithorax. IMPRESSION: PICC line tip in the distal SVC from left-sided approach. Bibasilar atelectasis again noted. Possible small focus of right basilar pneumonia. Electronically Signed   By: Ashley Royalty M.D.   On: 06/04/2016 17:13     Cardiac Studies   TEE: EF 45% Aortic valve small vegetation, no AI MVR intact, mild MR  ECHO: EF 35% Patient Profile     80 year old with MVR, PAF, MAZE, L prosthetic knee, GBS bacteremia, endocarditis.  Assessment & Plan    Endocarditis/ Gram positive bacteremia  - small mobile vegetation on aortic valve  - IV Abx per ID  - since there is no deterioration of AV, no AI, non-surgical.   - Ortho note reviewed  Cardiomyopathy  - no symptoms, no dyspnea  - Continue ARB  - Will restart metoprolol 25 ER. BP elevated this AM.  - mild edema LLE>R (infection)  MVR  - annuloplasty intact  - mild MR  - 2003  PAF  - MAZE  - Tele appears sinus with PAC's (don't think that she is having AFIB)  - Currently on Multaq  - There is a contraindication to this medication in anyone with symptomatic heart failure, that is heart  failure with recent decompensation requiring hospitalization or class IV symptoms. Since she does not have current decompensated heart failure, and since she has been on this medication for quite some time, we will continue at this point. Have caution if heart failure symptoms advance.   Signed, Candee Furbish, MD  06/05/2016, 9:43 AM

## 2016-06-05 NOTE — Progress Notes (Signed)
ANTICOAGULATION CONSULT NOTE - Follow Up Consult  Pharmacy Consult for heparin Indication: atrial fibrillation  Labs:  Recent Labs  06/02/16 0504 06/03/16 0212 06/04/16 0338 06/04/16 1316 06/05/16 0304  HGB 11.5* 10.8* 10.4*  --   --   HCT 34.8* 32.7* 32.5*  --   --   PLT 113* 114* 128*  --   --   LABPROT  --  24.6* 23.0*  --  20.2*  INR  --  2.18 2.00  --  1.70  HEPARINUNFRC  --   --  <0.10* 0.11* 0.16*  CREATININE 0.90 0.67 0.58  --   --      Assessment: 80yo female remains subtherapeutic on heparin after rate increases; plan for OR this am.  Goal of Therapy:  Heparin level 0.3-0.7 units/ml   Plan:  Will increase heparin gtt by 3 units/kg/hr to 1400 units/hr and f/u after OR.  Wynona Neat, PharmD, BCPS  06/05/2016,3:47 AM

## 2016-06-05 NOTE — Progress Notes (Signed)
PROGRESS NOTE    Toni Hanna  [id number] DOB: [birth year] DOA: 06/01/2016 PCP: Robert Bellow, MD   Chief Complaint  Patient presents with  . Knee Pain  . Hyperglycemia     Brief Narrative:  HPI on 06/01/2016 by Dr. Jackquline Berlin a 80 y.o.femalewith medical history significant of breast cancer, history of right breast lumpectomy, CAD, type 2 diabetes, hypertension who is coming to the emergency department due to left knee pain. Per patient, she started having left knee pain on Friday which continuedto get worse through the weekend. She had a left knee arthroplasty in [place] about 10 years ago. She states that she fell on the floor on Sunday evening due to weakness and was unable to get up from the floor, so she decided to go to sleep. This morning, when she woke up, she crawled to the other room to reach her phone, madea phone call for help and was subsequently brought to the emergency department. She estimates that she spent about 12 hours on the floor. She denies fever, but complains of chills, fatigue, generalized weakness, decreased appetite. Dr. Sabra Heck from the emergency department and spoke to Dr. Swintek(orthopedic surgery) who suggested transfer to [facility] for further evaluation and treatment.   Assessment & Plan   Sepsis secondary to septic arthritis left knee/Strep bacteremia/Endocarditis  -Upon admission, patient had mild leukocytosis, elevated lactic acid, soft BP.  She has been tachycardic and tachypneic.  -Fluid culture GPC in chains, culture strep agalactaie  -Blood cultures from 06/01/2016 show Strep agalactaie in 1/2 bottles -Initially placed on on Aztreonam, vanco  -Repeat Blood cultures from 06/02/2016 show no growth to date -ID consulted and appreciated.  -Echocardiogram: EF 30-35%.  Spoke with hospitalist in [place], who states her EF was 50-55% in June 2017.  -Orthopedics consulted and appreciated- plan for resection arthroplasty,  antibiotic spacer -Patient currently on ceftriaxone (given her PCN allergy- will watch and monitor very closely) -Cardiology consulted and appreciated, TEE showed EF 40-45%, small oscillating densities on noncoronary and right aortic cusps -ID added gentamicin -PICC lined placed  -Orthopedic surgery planning for removal of prosthesis on [id number]  New systolic CHF -Echo as above -Cardiology consulted and appreciated -Continue current medications, metoprolol restarted today -Currently euvolemic  -monitor intake/output, daily weights  Sepsis secondary to right breast cellulitis and ?UTI -Continue current IV antibiotic treatment as above -Urine culture: Ecoli -Erythema of the right breast appears to be improving  Uncontrolled type 2 diabetes mellitus -Hold metformin, glipizide  -emoglobin A1c 10.4 -Continue Lantus, ISS, CBG monitoring  -Will increase Lantus to 18u (from 16)  Hypokalemia/ hypomagnesemia -Replaced, continue to monitor   Hypophosphatemia -Resolved with replacement  A Fib on coumadin -CHADSVASC 5 (age, gender, DM, HTN) -Rate controlled -Metoprolol held due to low BP  -Coumadin held. Continue heparin -INR 1.61 -Given Vit K 5 mg orally.  Continue to watch INR.  Hypertension -Metoprolol held due to low BP   Lactic acidosis -Resolved with IVF   History of breast cancer -Continue Arimidex   DVT Prophylaxis  Heparin  Code Status: Full  Family Communication: None at bedside.  Spoke with Son and daughter-in-law via phone.    Disposition Plan: Admitted. Pending surgery on 06/08/2016 Patient's family would like patient transferred to [facility]. Patient was accepted in transfer, however, currently no beds available.   Consultants Orthopedics Infectious disease Cardiology  Procedures  None  Antibiotics   Anti-infectives    Start  Dose/Rate Route Frequency Ordered Stop   06/05/16 0600  vancomycin (VANCOCIN) IVPB 1000 mg/200  mL premix     1,000 mg 200 mL/hr over 60 Minutes Intravenous On call to O.R. 06/04/16 1736 06/05/16 0646   06/04/16 2200  gentamicin (GARAMYCIN) IVPB 60 mg     60 mg 100 mL/hr over 30 Minutes Intravenous Every 12 hours 06/04/16 1558     06/03/16 1800  cefTRIAXone (ROCEPHIN) 2 g in dextrose 5 % 50 mL IVPB     2 g 100 mL/hr over 30 Minutes Intravenous Every 24 hours 06/03/16 1613     06/03/16 1230  ceFAZolin (ANCEF) IVPB 2g/100 mL premix  Status:  Discontinued     2 g 200 mL/hr over 30 Minutes Intravenous Every 8 hours 06/03/16 1132 06/03/16 1613   06/02/16 1800  vancomycin (VANCOCIN) 1,250 mg in sodium chloride 0.9 % 250 mL IVPB  Status:  Discontinued     1,250 mg 166.7 mL/hr over 90 Minutes Intravenous Every 24 hours 06/01/16 1910 06/03/16 1132   06/02/16 0300  aztreonam (AZACTAM) 2 g in dextrose 5 % 50 mL IVPB  Status:  Discontinued     2 g 100 mL/hr over 30 Minutes Intravenous Every 8 hours 06/01/16 1910 06/02/16 1711   06/01/16 1915  vancomycin (VANCOCIN) 1,500 mg in sodium chloride 0.9 % 500 mL IVPB     1,500 mg 250 mL/hr over 120 Minutes Intravenous  Once 06/01/16 1901 06/01/16 2249   06/01/16 1900  aztreonam (AZACTAM) 2 g in dextrose 5 % 50 mL IVPB     2 g 100 mL/hr over 30 Minutes Intravenous  Once 06/01/16 1850 06/01/16 2035   06/01/16 1900  vancomycin (VANCOCIN) IVPB 1000 mg/200 mL premix  Status:  Discontinued     1,000 mg 200 mL/hr over 60 Minutes Intravenous  Once 06/01/16 1850 06/01/16 1901      Subjective:   Toni Hanna seen and examined today.  Patient very upset this morning that her surgery was cancelled and will be delayed until Monday.  She continues to have knee pain and swelling.  Denies chest pain, shortness of breath, abdominal pain, nausea, vomiting, diarrhea, constipation.    Objective:   Vitals:   06/05/16 0400 06/05/16 0757 06/05/16 0800 06/05/16 0957  BP: (!) 158/79 (!) 169/85 (!) 171/75 (!) 171/75  Pulse: 90 93 92 96  Resp: (!) 26 (!) 25 16   Temp:  100.3 F (37.9 C) 99.4 F (37.4 C)    TempSrc: Oral Oral    SpO2: 95% 98% 96%   Weight:      Height:        Intake/Output Summary (Last 24 hours) at 06/05/16 1216 Last data filed at 06/05/16 0815  Gross per 24 hour  Intake           3467.3 ml  Output              500 ml  Net           2967.3 ml   Filed Weights   06/01/16 1509  Weight: 82.6 kg (182 lb)    Exam  General: Well developed, well nourished, NAD, appears stated age  HEENT: NCAT,  mucous membranes moist.   Cardiovascular: S1 S2 auscultated, 2/6 SEM, RRR  Respiratory: Clear to auscultation bilaterally with equal chest rise  Abdomen: Soft, nontender, nondistended, + bowel sounds  Extremities: warm dry without cyanosis clubbing or edema. Left knee/leg swelling, mild pain with ROM  Neuro: AAOx3, nonfocal  Psych:  Appropriate mood and affect   Data Reviewed: I have personally reviewed following labs and imaging studies  CBC:  Recent Labs Lab 06/01/16 1551 06/01/16 2306 06/02/16 0504 06/03/16 0212 06/04/16 0338 06/05/16 0500  WBC 11.5* 8.9 7.5 6.1 7.2 6.9  NEUTROABS 9.0* 6.8 5.5 4.5 5.1  --   HGB 14.2 12.7 11.5* 10.8* 10.4* 10.2*  HCT 41.1 37.0 34.8* 32.7* 32.5* 31.5*  MCV 85.3 84.7 85.1 86.3 86.9 86.8  PLT 115* 121* 113* 114* 128* [id number]*   Basic Metabolic Panel:  Recent Labs Lab 06/01/16 1551 06/01/16 2306 06/02/16 0504 06/03/16 0212 06/04/16 0338 06/05/16 0500  NA 124* 132* 131* 132* 133* 135  K 4.1 3.5 3.3* 3.0* 3.3* 3.6  CL 90* 101 100* 103 101 100*  CO2 21* 24 23 22 22 23   GLUCOSE 559* 169* 141* 212* 199* 212*  BUN 25* 26* 25* 16 8 6   CREATININE 1.35* 0.95 0.90 0.67 0.58 0.55  CALCIUM 8.8* 8.1* 8.0* 7.7* 8.0* 8.5*  MG 1.9  --  1.9 1.6* 1.8  --   PHOS 1.9*  --  1.2* 1.8*  --   --    GFR: Estimated Creatinine Clearance: 59 mL/min (by C-G formula based on SCr of 0.55 mg/dL). Liver Function Tests:  Recent Labs Lab 06/01/16 2306 06/02/16 0504 06/03/16 0212 06/04/16 0338  AST 28  23 17 22   ALT 21 18 17 19   ALKPHOS 63 56 55 79  BILITOT 1.1 1.2 1.3* 0.8  PROT 5.4* 4.7* 4.7* 4.9*  ALBUMIN 2.7* 2.3* 2.1* 2.1*   No results for input(s): LIPASE, AMYLASE in the last 168 hours. No results for input(s): AMMONIA in the last 168 hours. Coagulation Profile:  Recent Labs Lab 06/01/16 2306 06/03/16 0212 06/04/16 0338 06/05/16 0304 06/05/16 0500  INR 3.15 2.18 2.00 1.70 1.61   Cardiac Enzymes:  Recent Labs Lab 06/01/16 1605  CKTOTAL 457*   BNP (last 3 results) No results for input(s): PROBNP in the last 8760 hours. HbA1C:  Recent Labs  06/02/16 1245  HGBA1C 10.4*   CBG:  Recent Labs Lab 06/04/16 1115 06/04/16 1326 06/04/16 1717 06/04/16 2155 06/05/16 0756  GLUCAP 162* 236* 214* 232* 193*   Lipid Profile: No results for input(s): CHOL, HDL, LDLCALC, TRIG, CHOLHDL, LDLDIRECT in the last 72 hours. Thyroid Function Tests: No results for input(s): TSH, T4TOTAL, FREET4, T3FREE, THYROIDAB in the last 72 hours. Anemia Panel: No results for input(s): VITAMINB12, FOLATE, FERRITIN, TIBC, IRON, RETICCTPCT in the last 72 hours. Urine analysis:    Component Value Date/Time   COLORURINE YELLOW 06/01/2016 2201   APPEARANCEUR HAZY (A) 06/01/2016 2201   LABSPEC 1.025 06/01/2016 2201   PHURINE 5.5 06/01/2016 2201   GLUCOSEU >1000 (A) 06/01/2016 2201   HGBUR MODERATE (A) 06/01/2016 2201   BILIRUBINUR NEGATIVE 06/01/2016 2201   KETONESUR NEGATIVE 06/01/2016 2201   PROTEINUR 30 (A) 06/01/2016 2201   NITRITE NEGATIVE 06/01/2016 2201   LEUKOCYTESUR SMALL (A) 06/01/2016 2201   Sepsis Labs: @LABRCNTIP (procalcitonin:4,lacticidven:4)  ) Recent Results (from the past 240 hour(s))  Gram stain     Status: None   Collection Time: 06/01/16  4:50 PM  Result Value Ref Range Status   Specimen Description FLUID SYNOVIAL LEFT KNEE  Final   Special Requests NONE  Final   Gram Stain   Final    ABUNDANT WBC PRESENT, PREDOMINANTLY PMN RARE INTRACELLULAR GRAM POSITIVE  COCCI Gram Stain Report Called to,Read Back By and Verified With: EDWARDS,C. AT 1804 ON 06/01/2016 BY BAUGHAM,M.  Report Status 06/01/2016 FINAL  Final  Culture, body fluid-bottle     Status: Abnormal   Collection Time: 06/01/16  4:50 PM  Result Value Ref Range Status   Specimen Description FLUID SYNOVIAL LEFT KNEE  Final   Special Requests BOTTLES DRAWN AEROBIC AND ANAEROBIC 3CC EACH  Final   Gram Stain   Final    GRAM POSITIVE COCCI IN CHAINS IN BOTH AEROBIC AND ANAEROBIC BOTTLES Gram Stain Report Called to,Read Back By and Verified With: REAP B AT [place] AT 0600 ON [id number] BY FORSYTH K Performed at [place] B STREP(S.AGALACTIAE)ISOLATED (A)  Final   Report Status 06/04/2016 FINAL  Final   Organism ID, Bacteria GROUP B STREP(S.AGALACTIAE)ISOLATED  Final      Susceptibility   Group b strep(s.agalactiae)isolated - MIC*    CLINDAMYCIN >=1 RESISTANT Resistant     AMPICILLIN <=0.25 SENSITIVE Sensitive     ERYTHROMYCIN >=8 RESISTANT Resistant     VANCOMYCIN 0.5 SENSITIVE Sensitive     CEFTRIAXONE <=0.12 SENSITIVE Sensitive     LEVOFLOXACIN 1 SENSITIVE Sensitive     * GROUP B STREP(S.AGALACTIAE)ISOLATED  Blood Culture (routine x 2)     Status: Abnormal   Collection Time: 06/01/16  7:42 PM  Result Value Ref Range Status   Specimen Description BLOOD LEFT HAND  Final   Special Requests BOTTLES DRAWN AEROBIC ONLY 6CC  Final   Culture  Setup Time   Final    GRAM POSITIVE COCCI IN CHAINS RECOVERED FROM THE AEROBIC BOTTLE Gram Stain Report Called to,Read Back By and Verified With: AGUIRRE,A. AT [id number] ON 06/02/2016 BY BAUGHAM,M. Performed at [place] ID to follow CRITICAL RESULT CALLED TO, READ BACK BY AND VERIFIED WITH: G ABBOTT PHARMD 2300 06/02/16 A BROWNING Performed at [place] B STREP(S.AGALACTIAE)ISOLATED (A)  Final   Report Status 06/04/2016 FINAL  Final   Organism ID, Bacteria GROUP B  STREP(S.AGALACTIAE)ISOLATED  Final      Susceptibility   Group b strep(s.agalactiae)isolated - MIC*    CLINDAMYCIN >=1 RESISTANT Resistant     AMPICILLIN <=0.25 SENSITIVE Sensitive     ERYTHROMYCIN >=8 RESISTANT Resistant     VANCOMYCIN 0.5 SENSITIVE Sensitive     CEFTRIAXONE <=0.12 SENSITIVE Sensitive     LEVOFLOXACIN 1 SENSITIVE Sensitive     * GROUP B STREP(S.AGALACTIAE)ISOLATED  Blood Culture ID Panel (Reflexed)     Status: Abnormal   Collection Time: 06/01/16  7:42 PM  Result Value Ref Range Status   Enterococcus species NOT DETECTED NOT DETECTED Final   Vancomycin resistance NOT DETECTED NOT DETECTED Final   Listeria monocytogenes NOT DETECTED NOT DETECTED Final   Staphylococcus species NOT DETECTED NOT DETECTED Final   Staphylococcus aureus NOT DETECTED NOT DETECTED Final   Methicillin resistance NOT DETECTED NOT DETECTED Final   Streptococcus species DETECTED (A) NOT DETECTED Final    Comment: CRITICAL RESULT CALLED TO, READ BACK BY AND VERIFIED WITH: G ABBOTT PHARMD 2300 06/02/16 A BROWNING    Streptococcus agalactiae DETECTED (A) NOT DETECTED Final    Comment: CRITICAL RESULT CALLED TO, READ BACK BY AND VERIFIED WITH: G ABBOTT PHARMD 2300 06/02/16 A BROWNING    Streptococcus pneumoniae NOT DETECTED NOT DETECTED Final   Streptococcus pyogenes NOT DETECTED NOT DETECTED Final   Acinetobacter baumannii NOT DETECTED NOT DETECTED Final   Enterobacteriaceae species NOT DETECTED NOT DETECTED Final   Enterobacter cloacae complex NOT DETECTED NOT DETECTED Final   Escherichia  coli NOT DETECTED NOT DETECTED Final   Klebsiella oxytoca NOT DETECTED NOT DETECTED Final   Klebsiella pneumoniae NOT DETECTED NOT DETECTED Final   Proteus species NOT DETECTED NOT DETECTED Final   Serratia marcescens NOT DETECTED NOT DETECTED Final   Carbapenem resistance NOT DETECTED NOT DETECTED Final   Haemophilus influenzae NOT DETECTED NOT DETECTED Final   Neisseria meningitidis NOT DETECTED NOT  DETECTED Final   Pseudomonas aeruginosa NOT DETECTED NOT DETECTED Final   Candida albicans NOT DETECTED NOT DETECTED Final   Candida glabrata NOT DETECTED NOT DETECTED Final   Candida krusei NOT DETECTED NOT DETECTED Final   Candida parapsilosis NOT DETECTED NOT DETECTED Final   Candida tropicalis NOT DETECTED NOT DETECTED Final    Comment: Performed at [facility]  Blood Culture (routine x 2)     Status: None (Preliminary result)   Collection Time: 06/01/16  7:54 PM  Result Value Ref Range Status   Specimen Description BLOOD LEFT HAND  Final   Special Requests BOTTLES DRAWN AEROBIC ONLY [place]  Final   Culture NO GROWTH 4 DAYS  Final   Report Status PENDING  Incomplete  Urine culture     Status: Abnormal   Collection Time: 06/01/16 10:01 PM  Result Value Ref Range Status   Specimen Description URINE, CLEAN CATCH  Final   Special Requests NONE  Final   Culture >=100,000 COLONIES/mL ESCHERICHIA COLI (A)  Final   Report Status 06/04/2016 FINAL  Final   Organism ID, Bacteria ESCHERICHIA COLI (A)  Final      Susceptibility   Escherichia coli - MIC*    AMPICILLIN >=32 RESISTANT Resistant     CEFAZOLIN 32 INTERMEDIATE Intermediate     CEFTRIAXONE <=1 SENSITIVE Sensitive     CIPROFLOXACIN <=0.25 SENSITIVE Sensitive     GENTAMICIN <=1 SENSITIVE Sensitive     IMIPENEM <=0.25 SENSITIVE Sensitive     NITROFURANTOIN <=16 SENSITIVE Sensitive     TRIMETH/SULFA <=20 SENSITIVE Sensitive     AMPICILLIN/SULBACTAM >=32 RESISTANT Resistant     PIP/TAZO 8 SENSITIVE Sensitive     Extended ESBL NEGATIVE Sensitive     * >=100,000 COLONIES/mL ESCHERICHIA COLI  MRSA PCR Screening     Status: None   Collection Time: 06/02/16  2:50 AM  Result Value Ref Range Status   MRSA by PCR NEGATIVE NEGATIVE Final    Comment:        The GeneXpert MRSA Assay (FDA approved for NASAL specimens only), is one component of a comprehensive MRSA colonization surveillance program. It is not intended to diagnose  MRSA infection nor to guide or monitor treatment for MRSA infections.   Culture, blood (Routine X 2) w Reflex to ID Panel     Status: None (Preliminary result)   Collection Time: 06/02/16  6:55 PM  Result Value Ref Range Status   Specimen Description BLOOD LEFT HAND  Final   Special Requests BOTTLES DRAWN AEROBIC ONLY 5CC  Final   Culture NO GROWTH 2 DAYS  Final   Report Status PENDING  Incomplete  Culture, blood (Routine X 2) w Reflex to ID Panel     Status: None (Preliminary result)   Collection Time: 06/02/16  7:05 PM  Result Value Ref Range Status   Specimen Description BLOOD LEFT ANTECUBITAL  Final   Special Requests BOTTLES DRAWN AEROBIC AND ANAEROBIC 10 CC EA  Final   Culture NO GROWTH 2 DAYS  Final   Report Status PENDING  Incomplete      Radiology  Studies: Dg Chest Port 1 View  Result Date: 06/04/2016 CLINICAL DATA:  Left-sided PICC line insertion EXAM: PORTABLE CHEST 1 VIEW COMPARISON:  06/01/2016 FINDINGS: Left-sided PICC line terminates in the distal SVC. Heart is enlarged. There is atelectasis at each lung base and lingula. Subtle confluent pulmonary opacities in the right lung base cannot exclude a small pneumonic consolidation. Aortic atherosclerosis at its arch. Cardiac valvular prosthetic with median sternotomy sutures are again noted. Surgical clips project over the right mid hemithorax. IMPRESSION: PICC line tip in the distal SVC from left-sided approach. Bibasilar atelectasis again noted. Possible small focus of right basilar pneumonia. Electronically Signed   By: Ashley Royalty M.D.   On: 06/04/2016 17:13     Scheduled Meds: . acetaminophen  1,000 mg Intravenous To OR  . anastrozole  1 mg Oral Daily  . cefTRIAXone (ROCEPHIN)  IV  2 g Intravenous Q24H  . chlorhexidine  60 mL Topical Once  . dronedarone  400 mg Oral BID WC  . gentamicin  60 mg Intravenous Q12H  . Influenza vac split quadrivalent PF  0.5 mL Intramuscular Tomorrow-1000  . insulin aspart  0-5 Units  Subcutaneous QHS  . insulin aspart  0-9 Units Subcutaneous TID WC  . insulin glargine  16 Units Subcutaneous QHS  . metoprolol succinate  25 mg Oral Daily  . multivitamin  2 tablet Oral Daily  . oxybutynin  10 mg Oral QHS  . pantoprazole  40 mg Oral Daily  . povidone-iodine  2 application Topical Once  . sodium chloride flush  10-40 mL Intracatheter Q12H  . sodium chloride flush  3 mL Intravenous Q12H  . tranexamic acid  1,000 mg Intravenous To OR   Continuous Infusions: . sodium chloride 125 mL/hr at 06/05/16 0050  . heparin 1,400 Units/hr (06/05/16 0400)     LOS: 4 days   Time Spent in minutes   30 minutes  Koki Buxton D.O. on 06/05/2016 at 12:16 PM  Between 7am to 7pm - Pager - [phone]  After 7pm go to [email or web address] - password [email or web address]  And look for the night coverage person covering for me after hours  [facility] Office  [phone]

## 2016-06-05 NOTE — Progress Notes (Signed)
Inpatient Diabetes Program Recommendations  AACE/ADA: New Consensus Statement on Inpatient Glycemic Control (2015)  Target Ranges:  Prepandial:   less than 140 mg/dL      Peak postprandial:   less than 180 mg/dL (1-2 hours)      Critically ill patients:  140 - 180 mg/dL   Lab Results  Component Value Date   GLUCAP 193 (H) 06/05/2016   HGBA1C 10.4 (H) 06/02/2016    Review of Glycemic Control  Diabetes history: DM 2 Outpatient Diabetes medications: Glipizide 10 mg BID, Metformin 500 mg BID Current orders for Inpatient glycemic control: Lantus 16 units QHS, Novolog Sensitive + HS  Inpatient Diabetes Program Recommendations:   Fasting glucose in the 190's the last 2 mornings. Please consider increasing Lantus to 18-20 units QHS.  Thanks,  Tama Headings RN, MSN, [facility] Inpatient Diabetes Coordinator Team Pager [phone] (8a-5p)

## 2016-06-05 NOTE — Progress Notes (Signed)
Subjective: INR was too high for surgery   Antibiotics:  Anti-infectives    Start     Dose/Rate Route Frequency Ordered Stop   06/05/16 0600  vancomycin (VANCOCIN) IVPB 1000 mg/200 mL premix     1,000 mg 200 mL/hr over 60 Minutes Intravenous On call to O.R. 06/04/16 1736 06/05/16 0646   06/04/16 2200  gentamicin (GARAMYCIN) IVPB 60 mg     60 mg 100 mL/hr over 30 Minutes Intravenous Every 12 hours 06/04/16 1558     06/03/16 1800  cefTRIAXone (ROCEPHIN) 2 g in dextrose 5 % 50 mL IVPB     2 g 100 mL/hr over 30 Minutes Intravenous Every 24 hours 06/03/16 1613     06/03/16 1230  ceFAZolin (ANCEF) IVPB 2g/100 mL premix  Status:  Discontinued     2 g 200 mL/hr over 30 Minutes Intravenous Every 8 hours 06/03/16 1132 06/03/16 1613   06/02/16 1800  vancomycin (VANCOCIN) 1,250 mg in sodium chloride 0.9 % 250 mL IVPB  Status:  Discontinued     1,250 mg 166.7 mL/hr over 90 Minutes Intravenous Every 24 hours 06/01/16 1910 06/03/16 1132   06/02/16 0300  aztreonam (AZACTAM) 2 g in dextrose 5 % 50 mL IVPB  Status:  Discontinued     2 g 100 mL/hr over 30 Minutes Intravenous Every 8 hours 06/01/16 1910 06/02/16 1711   06/01/16 1915  vancomycin (VANCOCIN) 1,500 mg in sodium chloride 0.9 % 500 mL IVPB     1,500 mg 250 mL/hr over 120 Minutes Intravenous  Once 06/01/16 1901 06/01/16 2249   06/01/16 1900  aztreonam (AZACTAM) 2 g in dextrose 5 % 50 mL IVPB     2 g 100 mL/hr over 30 Minutes Intravenous  Once 06/01/16 1850 06/01/16 2035   06/01/16 1900  vancomycin (VANCOCIN) IVPB 1000 mg/200 mL premix  Status:  Discontinued     1,000 mg 200 mL/hr over 60 Minutes Intravenous  Once 06/01/16 1850 06/01/16 1901      Medications: Scheduled Meds: . acetaminophen  1,000 mg Intravenous To OR  . anastrozole  1 mg Oral Daily  . cefTRIAXone (ROCEPHIN)  IV  2 g Intravenous Q24H  . chlorhexidine  60 mL Topical Once  . dronedarone  400 mg Oral BID WC  . gentamicin  60 mg Intravenous Q12H  .  Influenza vac split quadrivalent PF  0.5 mL Intramuscular Tomorrow-1000  . insulin aspart  0-5 Units Subcutaneous QHS  . insulin aspart  0-9 Units Subcutaneous TID WC  . insulin glargine  18 Units Subcutaneous QHS  . metoprolol succinate  25 mg Oral Daily  . multivitamin  2 tablet Oral Daily  . oxybutynin  10 mg Oral QHS  . pantoprazole  40 mg Oral Daily  . povidone-iodine  2 application Topical Once  . sodium chloride flush  10-40 mL Intracatheter Q12H  . sodium chloride flush  3 mL Intravenous Q12H  . [START ON 06/08/2016] tranexamic acid  1,000 mg Intravenous To OR   Continuous Infusions: . sodium chloride 125 mL/hr at 06/05/16 0050  . heparin 1,450 Units/hr (06/05/16 1440)   PRN Meds:.acetaminophen, dextrose, HYDROcodone-acetaminophen, loperamide, morphine injection, sodium chloride flush, zolpidem    Objective: Weight change:   Intake/Output Summary (Last 24 hours) at 06/05/16 1656 Last data filed at 06/05/16 0815  Gross per 24 hour  Intake          3623.55 ml  Output  500 ml  Net          3123.55 ml   Blood pressure (!) 153/72, pulse 93, temperature 98 F (36.7 C), temperature source Oral, resp. rate (!) 27, height 5\' 3"  (1.6 m), weight 182 lb (82.6 kg), SpO2 96 %. Temp:  [98 F (36.7 C)-100.3 F (37.9 C)] 98 F (36.7 C) (10/06 1614) Pulse Rate:  [85-96] 93 (10/06 1614) Resp:  [16-27] 27 (10/06 1614) BP: (144-171)/(72-85) 153/72 (10/06 1614) SpO2:  [95 %-98 %] 96 % (10/06 1614)  Physical Exam: General: Alert and awake, oriented x3, not in any acute distress. General: Alert and awake, oriented x3, not in any acute distress. HEENT: anicteric sclera,  EOMI, oropharynx clear and without exudate Cardiovascular: regular rate, normal r,  no murmur rubs or gallops Pulmonary: clear to auscultation bilaterally, no wheezing, rales or rhonchi Gastrointestinal: soft nontender, nondistended, normal bowel sounds, Musculoskeletal: edema warmth left knee Neuro:  nonfocal  CBC:  CBC Latest Ref Rng & Units 06/05/2016 06/04/2016 06/03/2016  WBC 4.0 - 10.5 K/uL 6.9 7.2 6.1  Hemoglobin 12.0 - 15.0 g/dL 10.2(L) 10.4(L) 10.8(L)  Hematocrit 36.0 - 46.0 % 31.5(L) 32.5(L) 32.7(L)  Platelets 150 - 400 K/uL 147(L) 128(L) 114(L)      BMET  Recent Labs  06/04/16 0338 06/05/16 0500  NA 133* 135  K 3.3* 3.6  CL 101 100*  CO2 22 23  GLUCOSE 199* 212*  BUN 8 6  CREATININE 0.58 0.55  CALCIUM 8.0* 8.5*     Liver Panel   Recent Labs  06/03/16 0212 06/04/16 0338  PROT 4.7* 4.9*  ALBUMIN 2.1* 2.1*  AST 17 22  ALT 17 19  ALKPHOS 55 79  BILITOT 1.3* 0.8       Sedimentation Rate No results for input(s): ESRSEDRATE in the last 72 hours. C-Reactive Protein No results for input(s): CRP in the last 72 hours.  Micro Results: Recent Results (from the past 720 hour(s))  Gram stain     Status: None   Collection Time: 06/01/16  4:50 PM  Result Value Ref Range Status   Specimen Description FLUID SYNOVIAL LEFT KNEE  Final   Special Requests NONE  Final   Gram Stain   Final    ABUNDANT WBC PRESENT, PREDOMINANTLY PMN RARE INTRACELLULAR GRAM POSITIVE COCCI Gram Stain Report Called to,Read Back By and Verified With: EDWARDS,C. AT 1804 ON 06/01/2016 BY BAUGHAM,M.    Report Status 06/01/2016 FINAL  Final  Culture, body fluid-bottle     Status: Abnormal   Collection Time: 06/01/16  4:50 PM  Result Value Ref Range Status   Specimen Description FLUID SYNOVIAL LEFT KNEE  Final   Special Requests BOTTLES DRAWN AEROBIC AND ANAEROBIC 3CC EACH  Final   Gram Stain   Final    GRAM POSITIVE COCCI IN CHAINS IN BOTH AEROBIC AND ANAEROBIC BOTTLES Gram Stain Report Called to,Read Back By and Verified With: REAP B AT [place] AT 0600 ON [id number] BY FORSYTH K Performed at [place] B STREP(S.AGALACTIAE)ISOLATED (A)  Final   Report Status 06/04/2016 FINAL  Final   Organism ID, Bacteria GROUP B STREP(S.AGALACTIAE)ISOLATED  Final       Susceptibility   Group b strep(s.agalactiae)isolated - MIC*    CLINDAMYCIN >=1 RESISTANT Resistant     AMPICILLIN <=0.25 SENSITIVE Sensitive     ERYTHROMYCIN >=8 RESISTANT Resistant     VANCOMYCIN 0.5 SENSITIVE Sensitive     CEFTRIAXONE <=0.12 SENSITIVE Sensitive     LEVOFLOXACIN  1 SENSITIVE Sensitive     * GROUP B STREP(S.AGALACTIAE)ISOLATED  Blood Culture (routine x 2)     Status: Abnormal   Collection Time: 06/01/16  7:42 PM  Result Value Ref Range Status   Specimen Description BLOOD LEFT HAND  Final   Special Requests BOTTLES DRAWN AEROBIC ONLY [place]  Final   Culture  Setup Time   Final    GRAM POSITIVE COCCI IN CHAINS RECOVERED FROM THE AEROBIC BOTTLE Gram Stain Report Called to,Read Back By and Verified With: AGUIRRE,A. AT [id number] ON 06/02/2016 BY BAUGHAM,M. Performed at [facility] ID to follow CRITICAL RESULT CALLED TO, READ BACK BY AND VERIFIED WITH: G ABBOTT PHARMD 2300 06/02/16 A BROWNING Performed at [place] B STREP(S.AGALACTIAE)ISOLATED (A)  Final   Report Status 06/04/2016 FINAL  Final   Organism ID, Bacteria GROUP B STREP(S.AGALACTIAE)ISOLATED  Final      Susceptibility   Group b strep(s.agalactiae)isolated - MIC*    CLINDAMYCIN >=1 RESISTANT Resistant     AMPICILLIN <=0.25 SENSITIVE Sensitive     ERYTHROMYCIN >=8 RESISTANT Resistant     VANCOMYCIN 0.5 SENSITIVE Sensitive     CEFTRIAXONE <=0.12 SENSITIVE Sensitive     LEVOFLOXACIN 1 SENSITIVE Sensitive     * GROUP B STREP(S.AGALACTIAE)ISOLATED  Blood Culture ID Panel (Reflexed)     Status: Abnormal   Collection Time: 06/01/16  7:42 PM  Result Value Ref Range Status   Enterococcus species NOT DETECTED NOT DETECTED Final   Vancomycin resistance NOT DETECTED NOT DETECTED Final   Listeria monocytogenes NOT DETECTED NOT DETECTED Final   Staphylococcus species NOT DETECTED NOT DETECTED Final   Staphylococcus aureus NOT DETECTED NOT DETECTED Final   Methicillin resistance NOT  DETECTED NOT DETECTED Final   Streptococcus species DETECTED (A) NOT DETECTED Final    Comment: CRITICAL RESULT CALLED TO, READ BACK BY AND VERIFIED WITH: G ABBOTT PHARMD 2300 06/02/16 A BROWNING    Streptococcus agalactiae DETECTED (A) NOT DETECTED Final    Comment: CRITICAL RESULT CALLED TO, READ BACK BY AND VERIFIED WITH: G ABBOTT PHARMD 2300 06/02/16 A BROWNING    Streptococcus pneumoniae NOT DETECTED NOT DETECTED Final   Streptococcus pyogenes NOT DETECTED NOT DETECTED Final   Acinetobacter baumannii NOT DETECTED NOT DETECTED Final   Enterobacteriaceae species NOT DETECTED NOT DETECTED Final   Enterobacter cloacae complex NOT DETECTED NOT DETECTED Final   Escherichia coli NOT DETECTED NOT DETECTED Final   Klebsiella oxytoca NOT DETECTED NOT DETECTED Final   Klebsiella pneumoniae NOT DETECTED NOT DETECTED Final   Proteus species NOT DETECTED NOT DETECTED Final   Serratia marcescens NOT DETECTED NOT DETECTED Final   Carbapenem resistance NOT DETECTED NOT DETECTED Final   Haemophilus influenzae NOT DETECTED NOT DETECTED Final   Neisseria meningitidis NOT DETECTED NOT DETECTED Final   Pseudomonas aeruginosa NOT DETECTED NOT DETECTED Final   Candida albicans NOT DETECTED NOT DETECTED Final   Candida glabrata NOT DETECTED NOT DETECTED Final   Candida krusei NOT DETECTED NOT DETECTED Final   Candida parapsilosis NOT DETECTED NOT DETECTED Final   Candida tropicalis NOT DETECTED NOT DETECTED Final    Comment: Performed at [facility]  Blood Culture (routine x 2)     Status: None (Preliminary result)   Collection Time: 06/01/16  7:54 PM  Result Value Ref Range Status   Specimen Description BLOOD LEFT HAND  Final   Special Requests BOTTLES DRAWN AEROBIC ONLY 6CC  Final   Culture NO GROWTH  4 DAYS  Final   Report Status PENDING  Incomplete  Urine culture     Status: Abnormal   Collection Time: 06/01/16 10:01 PM  Result Value Ref Range Status   Specimen Description URINE, CLEAN  CATCH  Final   Special Requests NONE  Final   Culture >=100,000 COLONIES/mL ESCHERICHIA COLI (A)  Final   Report Status 06/04/2016 FINAL  Final   Organism ID, Bacteria ESCHERICHIA COLI (A)  Final      Susceptibility   Escherichia coli - MIC*    AMPICILLIN >=32 RESISTANT Resistant     CEFAZOLIN 32 INTERMEDIATE Intermediate     CEFTRIAXONE <=1 SENSITIVE Sensitive     CIPROFLOXACIN <=0.25 SENSITIVE Sensitive     GENTAMICIN <=1 SENSITIVE Sensitive     IMIPENEM <=0.25 SENSITIVE Sensitive     NITROFURANTOIN <=16 SENSITIVE Sensitive     TRIMETH/SULFA <=20 SENSITIVE Sensitive     AMPICILLIN/SULBACTAM >=32 RESISTANT Resistant     PIP/TAZO 8 SENSITIVE Sensitive     Extended ESBL NEGATIVE Sensitive     * >=100,000 COLONIES/mL ESCHERICHIA COLI  MRSA PCR Screening     Status: None   Collection Time: 06/02/16  2:50 AM  Result Value Ref Range Status   MRSA by PCR NEGATIVE NEGATIVE Final    Comment:        The GeneXpert MRSA Assay (FDA approved for NASAL specimens only), is one component of a comprehensive MRSA colonization surveillance program. It is not intended to diagnose MRSA infection nor to guide or monitor treatment for MRSA infections.   Culture, blood (Routine X 2) w Reflex to ID Panel     Status: None (Preliminary result)   Collection Time: 06/02/16  6:55 PM  Result Value Ref Range Status   Specimen Description BLOOD LEFT HAND  Final   Special Requests BOTTLES DRAWN AEROBIC ONLY 5CC  Final   Culture NO GROWTH 3 DAYS  Final   Report Status PENDING  Incomplete  Culture, blood (Routine X 2) w Reflex to ID Panel     Status: None (Preliminary result)   Collection Time: 06/02/16  7:05 PM  Result Value Ref Range Status   Specimen Description BLOOD LEFT ANTECUBITAL  Final   Special Requests BOTTLES DRAWN AEROBIC AND ANAEROBIC 10 CC EA  Final   Culture NO GROWTH 3 DAYS  Final   Report Status PENDING  Incomplete    Studies/Results: Dg Chest Port 1 View  Result Date:  06/04/2016 CLINICAL DATA:  Left-sided PICC line insertion EXAM: PORTABLE CHEST 1 VIEW COMPARISON:  06/01/2016 FINDINGS: Left-sided PICC line terminates in the distal SVC. Heart is enlarged. There is atelectasis at each lung base and lingula. Subtle confluent pulmonary opacities in the right lung base cannot exclude a small pneumonic consolidation. Aortic atherosclerosis at its arch. Cardiac valvular prosthetic with median sternotomy sutures are again noted. Surgical clips project over the right mid hemithorax. IMPRESSION: PICC line tip in the distal SVC from left-sided approach. Bibasilar atelectasis again noted. Possible small focus of right basilar pneumonia. Electronically Signed   By: Ashley Royalty M.D.   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 Marlou Porch, Dr Stanford Breed and Dr Noralyn Pick critical help with this  patient  My understanding is REMOVAL OF PROSTHESIS with I and D is planned in the OR  Monday since INR was too high today   We will plan on 6 weeks of IV ceftriaxone and 2 weeks of IV gentamicin with day #1 being Saturday  She will need her GFR followed clearly and high vigilance for hearing loss on gent  We have stopped the ARB and would be VERY CAUTIOUS with diuretics and err on the side of hypervolemia while she is getting GENT  SON IS CONCERNED ABOUT RISK OF PICC BEING INFECTED DUE TO DELAY in surgery  I told him IF she had fever over 101.5 we would repeat blood cultures and if they were positive remove the PICC  Dr Johnnye Sima is available this weekend for questions.    LOS: 4 days   Alcide Evener 06/05/2016, 4:56 PM

## 2016-06-05 NOTE — Progress Notes (Signed)
Patient remains on heparin gtt. INR 1.7 this am. Not ready for OR today.  GBS L knee PJI Needs resection arthroplasty, placement of abx spacer Plan for OR Monday Continue to hold coumadin NPO after MN Sunday night D/C heparin gtt at 0600 on Monday am

## 2016-06-06 ENCOUNTER — Inpatient Hospital Stay (HOSPITAL_COMMUNITY): Payer: Medicare Other

## 2016-06-06 LAB — CULTURE, BLOOD (ROUTINE X 2): Culture: NO GROWTH

## 2016-06-06 LAB — CBC
HEMATOCRIT: 34.1 % — AB (ref 36.0–46.0)
HEMOGLOBIN: 11 g/dL — AB (ref 12.0–15.0)
MCH: 28.1 pg (ref 26.0–34.0)
MCHC: 32.3 g/dL (ref 30.0–36.0)
MCV: 87.2 fL (ref 78.0–100.0)
Platelets: 176 10*3/uL (ref 150–400)
RBC: 3.91 MIL/uL (ref 3.87–5.11)
RDW: 15.4 % (ref 11.5–15.5)
WBC: 6.9 10*3/uL (ref 4.0–10.5)

## 2016-06-06 LAB — BASIC METABOLIC PANEL
Anion gap: 8 (ref 5–15)
BUN: 6 mg/dL (ref 6–20)
CHLORIDE: 103 mmol/L (ref 101–111)
CO2: 24 mmol/L (ref 22–32)
Calcium: 8.3 mg/dL — ABNORMAL LOW (ref 8.9–10.3)
Creatinine, Ser: 0.57 mg/dL (ref 0.44–1.00)
GFR calc Af Amer: >60 mL/min (ref >60)
GFR calc non Af Amer: >60 mL/min (ref >60)
GLUCOSE: 248 mg/dL — AB (ref 65–99)
POTASSIUM: 3.5 mmol/L (ref 3.5–5.1)
Sodium: 135 mmol/L (ref 135–145)

## 2016-06-06 LAB — GLUCOSE, CAPILLARY
GLUCOSE-CAPILLARY: 203 mg/dL — AB (ref 65–99)
GLUCOSE-CAPILLARY: 213 mg/dL — AB (ref 65–99)
GLUCOSE-CAPILLARY: 200 mg/dL — AB (ref 65–99)
Glucose-Capillary: 198 mg/dL — ABNORMAL HIGH (ref 65–99)

## 2016-06-06 LAB — GENTAMICIN LEVEL, TROUGH: Gentamicin Trough: 0.5 ug/mL (ref 0.5–2.0)

## 2016-06-06 LAB — HEPARIN LEVEL (UNFRACTIONATED)
Heparin Unfractionated: 0.26 IU/mL — ABNORMAL LOW (ref 0.30–0.70)
Heparin Unfractionated: 1.6 IU/mL — ABNORMAL HIGH (ref 0.30–0.70)

## 2016-06-06 LAB — PROTIME-INR
INR: 1.56
PROTHROMBIN TIME: 18.8 s — AB (ref 11.4–15.2)

## 2016-06-06 LAB — GENTAMICIN LEVEL, PEAK: Gentamicin Pk: 4.4 ug/mL — ABNORMAL LOW (ref 5.0–10.0)

## 2016-06-06 MED ORDER — FUROSEMIDE 10 MG/ML IJ SOLN
40.0000 mg | Freq: Two times a day (BID) | INTRAMUSCULAR | Status: DC
  Administered 2016-06-06 – 2016-06-09 (×6): 40 mg via INTRAVENOUS
  Filled 2016-06-06 (×6): qty 4

## 2016-06-06 MED ORDER — HEPARIN (PORCINE) IN NACL 100-0.45 UNIT/ML-% IJ SOLN
1600.0000 [IU]/h | INTRAMUSCULAR | Status: DC
  Administered 2016-06-06: 1450 [IU]/h via INTRAVENOUS
  Administered 2016-06-07: 1600 [IU]/h via INTRAVENOUS
  Administered 2016-06-07: 1450 [IU]/h via INTRAVENOUS
  Filled 2016-06-06 (×2): qty 250

## 2016-06-06 MED ORDER — METOPROLOL SUCCINATE ER 25 MG PO TB24
37.5000 mg | ORAL_TABLET | Freq: Every day | ORAL | Status: DC
  Administered 2016-06-07 – 2016-06-09 (×2): 37.5 mg via ORAL
  Filled 2016-06-06 (×2): qty 2

## 2016-06-06 MED ORDER — SACCHAROMYCES BOULARDII 250 MG PO CAPS
250.0000 mg | ORAL_CAPSULE | Freq: Two times a day (BID) | ORAL | Status: DC
  Administered 2016-06-06 – 2016-06-11 (×10): 250 mg via ORAL
  Filled 2016-06-06 (×10): qty 1

## 2016-06-06 MED ORDER — POTASSIUM CHLORIDE CRYS ER 20 MEQ PO TBCR
40.0000 meq | EXTENDED_RELEASE_TABLET | Freq: Once | ORAL | Status: AC
  Administered 2016-06-06: 40 meq via ORAL
  Filled 2016-06-06: qty 2

## 2016-06-06 NOTE — Progress Notes (Signed)
  LATE ENTRY: Patient's son, Shea Stakes and family requested to speak with MD. Text sent to Dr. Lyla Glassing requesting him  to speak to the patient and family regarding cancelled OR procedure.   MD texted me back that he will be here to see them.  0900:  MD came to talk to patient and family to clear all the confusion.  After MD spoke with the family, they seems satisfied.   I asked them if they have any other questions and they stated that they understood as why the surgery was not done.

## 2016-06-06 NOTE — Progress Notes (Signed)
PROGRESS NOTE    Toni Hanna  [id number] DOB: [birth year] DOA: 06/01/2016 PCP: Robert Bellow, MD   Chief Complaint  Patient presents with  . Knee Pain  . Hyperglycemia     Brief Narrative:  HPI on 06/01/2016 by Dr. Jackquline Berlin a 80 y.o.femalewith medical history significant of breast cancer, history of right breast lumpectomy, CAD, type 2 diabetes, hypertension who is coming to the emergency department due to left knee pain. Per patient, she started having left knee pain on Friday which continuedto get worse through the weekend. She had a left knee arthroplasty in [place] about 10 years ago. She states that she fell on the floor on Sunday evening due to weakness and was unable to get up from the floor, so she decided to go to sleep. This morning, when she woke up, she crawled to the other room to reach her phone, madea phone call for help and was subsequently brought to the emergency department. She estimates that she spent about 12 hours on the floor. She denies fever, but complains of chills, fatigue, generalized weakness, decreased appetite. Dr. Sabra Heck from the emergency department and spoke to Dr. Swintek(orthopedic surgery) who suggested transfer to [facility] for further evaluation and treatment.   Assessment & Plan   Sepsis secondary to septic arthritis left knee/Strep bacteremia/Endocarditis  -Upon admission, patient had mild leukocytosis, elevated lactic acid, soft BP.  She has been tachycardic and tachypneic.  -Fluid culture GPC in chains, culture strep agalactaie  -Blood cultures from 06/01/2016 show Strep agalactaie in 1/2 bottles -Initially placed on on Aztreonam, vanco  -Repeat Blood cultures from 06/02/2016 show no growth to date -ID consulted and appreciated.  -Echocardiogram: EF 30-35%.  Spoke with hospitalist in [place], who states her EF was 50-55% in June 2017.  -Orthopedics consulted and appreciated- plan for resection arthroplasty,  antibiotic spacer -Patient currently on ceftriaxone (given her PCN allergy- will watch and monitor very closely) -Cardiology consulted and appreciated, TEE showed EF 40-45%, small oscillating densities on noncoronary and right aortic cusps -ID added gentamicin -PICC lined placed  -Orthopedic surgery planning for removal of prosthesis on [id number]  New systolic CHF -Echo as above -Cardiology consulted and appreciated -Continue current medications -Currently euvolemic  -monitor intake/output, daily weights  Sepsis secondary to right breast cellulitis and ?UTI -Continue current IV antibiotic treatment as above -Urine culture: Ecoli -Erythema of the right breast appears to be improving  Uncontrolled type 2 diabetes mellitus -Hold metformin, glipizide  -hemoglobin A1c 10.4 -Continue Lantus, ISS, CBG monitoring  -Increased Lantus to 18u (from 16)  Hypokalemia/ hypomagnesemia -Replaced, continue to monitor   Hypophosphatemia -Resolved with replacement  A Fib on coumadin -CHADSVASC 5 (age, gender, DM, HTN) -Rate controlled -Continue metoprolol -Coumadin held. Continue heparin -INR 1.56 -Given Vit K 5 mg orally (2.5mg  x 2).  Continue to watch INR.  Mild wheezing -Will obtain chest xray -Continue incentive spirometry  Hypertension -Continue metoprolol  Lactic acidosis -Resolved with IVF   History of breast cancer -Continue Arimidex   DVT Prophylaxis  Heparin  Code Status: Full  Family Communication: None at bedside.    Disposition Plan: Admitted. Pending surgery on 06/08/2016 Patient's family would like patient transferred to [facility]. Patient was accepted in transfer, however, currently no beds available.   Consultants Orthopedics Infectious disease Cardiology  Procedures  PICC line placed Echocardiogram TEE  Antibiotics   Anti-infectives    Start     Dose/Rate Route Frequency Ordered  Stop   06/05/16 0600  vancomycin  (VANCOCIN) IVPB 1000 mg/200 mL premix     1,000 mg 200 mL/hr over 60 Minutes Intravenous On call to O.R. 06/04/16 1736 06/05/16 0646   06/04/16 2200  gentamicin (GARAMYCIN) IVPB 60 mg     60 mg 100 mL/hr over 30 Minutes Intravenous Every 12 hours 06/04/16 1558     06/03/16 1800  cefTRIAXone (ROCEPHIN) 2 g in dextrose 5 % 50 mL IVPB     2 g 100 mL/hr over 30 Minutes Intravenous Every 24 hours 06/03/16 1613     06/03/16 1230  ceFAZolin (ANCEF) IVPB 2g/100 mL premix  Status:  Discontinued     2 g 200 mL/hr over 30 Minutes Intravenous Every 8 hours 06/03/16 1132 06/03/16 1613   06/02/16 1800  vancomycin (VANCOCIN) 1,250 mg in sodium chloride 0.9 % 250 mL IVPB  Status:  Discontinued     1,250 mg 166.7 mL/hr over 90 Minutes Intravenous Every 24 hours 06/01/16 1910 06/03/16 1132   06/02/16 0300  aztreonam (AZACTAM) 2 g in dextrose 5 % 50 mL IVPB  Status:  Discontinued     2 g 100 mL/hr over 30 Minutes Intravenous Every 8 hours 06/01/16 1910 06/02/16 1711   06/01/16 1915  vancomycin (VANCOCIN) 1,500 mg in sodium chloride 0.9 % 500 mL IVPB     1,500 mg 250 mL/hr over 120 Minutes Intravenous  Once 06/01/16 1901 06/01/16 2249   06/01/16 1900  aztreonam (AZACTAM) 2 g in dextrose 5 % 50 mL IVPB     2 g 100 mL/hr over 30 Minutes Intravenous  Once 06/01/16 1850 06/01/16 2035   06/01/16 1900  vancomycin (VANCOCIN) IVPB 1000 mg/200 mL premix  Status:  Discontinued     1,000 mg 200 mL/hr over 60 Minutes Intravenous  Once 06/01/16 1850 06/01/16 1901      Subjective:   Toni Hanna seen and examined today.  Continues to complain of knee/leg pain, worsens with movement.  Denies chest pain, shortness of breath, abdominal pain, nausea, vomiting, diarrhea, constipation.    Objective:   Vitals:   06/05/16 2337 06/06/16 0313 06/06/16 0800 06/06/16 0949  BP: (!) 141/82 (!) 145/83 (!) 180/121 (!) 155/73  Pulse: 91 (!) 115 (!) 113 100  Resp: (!) 22 (!) 23 (!) 24   Temp: 99.6 F (37.6 C) 99.1 F (37.3 C)  98.7 F (37.1 C)   TempSrc: Oral Oral Oral   SpO2: 92% 93% 98%   Weight:      Height:        Intake/Output Summary (Last 24 hours) at 06/06/16 0959 Last data filed at 06/06/16 0600  Gross per 24 hour  Intake          3091.09 ml  Output              250 ml  Net          2841.09 ml   Filed Weights   06/01/16 1509  Weight: 82.6 kg (182 lb)    Exam  General: Well developed, well nourished, NAD, appears stated age  HEENT: NCAT,  mucous membranes moist.   Cardiovascular: S1 S2 auscultated, 2/6 SEM, RRR  Respiratory: Mild exp wheezing, no crackles. Good air movement.  Abdomen: Soft, nontender, nondistended, + bowel sounds  Extremities: warm dry without cyanosis clubbing or edema. Left knee/leg swelling, mild pain with ROM  Neuro: AAOx3, nonfocal  Psych: Appropriate mood and affect, pleasant   Data Reviewed: I have personally reviewed following labs and  imaging studies  CBC:  Recent Labs Lab 06/01/16 1551 06/01/16 2306 06/02/16 0504 06/03/16 0212 06/04/16 0338 06/05/16 0500 06/06/16 0416  WBC 11.5* 8.9 7.5 6.1 7.2 6.9 6.9  NEUTROABS 9.0* 6.8 5.5 4.5 5.1  --   --   HGB 14.2 12.7 11.5* 10.8* 10.4* 10.2* 11.0*  HCT 41.1 37.0 34.8* 32.7* 32.5* 31.5* 34.1*  MCV 85.3 84.7 85.1 86.3 86.9 86.8 87.2  PLT 115* 121* 113* 114* 128* 147* 0000000   Basic Metabolic Panel:  Recent Labs Lab 06/01/16 1551  06/02/16 0504 06/03/16 0212 06/04/16 0338 06/05/16 0500 06/06/16 0416  NA 124*  < > 131* 132* 133* 135 135  K 4.1  < > 3.3* 3.0* 3.3* 3.6 3.5  CL 90*  < > 100* 103 101 100* 103  CO2 21*  < > 23 22 22 23 24   GLUCOSE 559*  < > 141* 212* 199* 212* 248*  BUN 25*  < > 25* 16 8 6 6   CREATININE 1.35*  < > 0.90 0.67 0.58 0.55 0.57  CALCIUM 8.8*  < > 8.0* 7.7* 8.0* 8.5* 8.3*  MG 1.9  --  1.9 1.6* 1.8  --   --   PHOS 1.9*  --  1.2* 1.8*  --   --   --   < > = values in this interval not displayed. GFR: Estimated Creatinine Clearance: 59 mL/min (by C-G formula based on SCr of  0.57 mg/dL). Liver Function Tests:  Recent Labs Lab 06/01/16 2306 06/02/16 0504 06/03/16 0212 06/04/16 0338  AST 28 23 17 22   ALT 21 18 17 19   ALKPHOS 63 56 55 79  BILITOT 1.1 1.2 1.3* 0.8  PROT 5.4* 4.7* 4.7* 4.9*  ALBUMIN 2.7* 2.3* 2.1* 2.1*   No results for input(s): LIPASE, AMYLASE in the last 168 hours. No results for input(s): AMMONIA in the last 168 hours. Coagulation Profile:  Recent Labs Lab 06/04/16 0338 06/05/16 0304 06/05/16 0500 06/05/16 1338 06/06/16 0416  INR 2.00 1.70 1.61 1.50 1.56   Cardiac Enzymes:  Recent Labs Lab 06/01/16 1605  CKTOTAL 457*   BNP (last 3 results) No results for input(s): PROBNP in the last 8760 hours. HbA1C: No results for input(s): HGBA1C in the last 72 hours. CBG:  Recent Labs Lab 06/05/16 0756 06/05/16 1237 06/05/16 1644 06/05/16 2238 06/06/16 0840  GLUCAP 193* 203* 186* 217* 203*   Lipid Profile: No results for input(s): CHOL, HDL, LDLCALC, TRIG, CHOLHDL, LDLDIRECT in the last 72 hours. Thyroid Function Tests: No results for input(s): TSH, T4TOTAL, FREET4, T3FREE, THYROIDAB in the last 72 hours. Anemia Panel: No results for input(s): VITAMINB12, FOLATE, FERRITIN, TIBC, IRON, RETICCTPCT in the last 72 hours. Urine analysis:    Component Value Date/Time   COLORURINE YELLOW 06/01/2016 2201   APPEARANCEUR HAZY (A) 06/01/2016 2201   LABSPEC 1.025 06/01/2016 2201   PHURINE 5.5 06/01/2016 2201   GLUCOSEU >1000 (A) 06/01/2016 2201   HGBUR MODERATE (A) 06/01/2016 2201   BILIRUBINUR NEGATIVE 06/01/2016 2201   KETONESUR NEGATIVE 06/01/2016 2201   PROTEINUR 30 (A) 06/01/2016 2201   NITRITE NEGATIVE 06/01/2016 2201   LEUKOCYTESUR SMALL (A) 06/01/2016 2201   Sepsis Labs: @LABRCNTIP (procalcitonin:4,lacticidven:4)  ) Recent Results (from the past 240 hour(s))  Gram stain     Status: None   Collection Time: 06/01/16  4:50 PM  Result Value Ref Range Status   Specimen Description FLUID SYNOVIAL LEFT KNEE  Final    Special Requests NONE  Final   Gram Stain  Final    ABUNDANT WBC PRESENT, PREDOMINANTLY PMN RARE INTRACELLULAR GRAM POSITIVE COCCI Gram Stain Report Called to,Read Back By and Verified With: EDWARDS,C. AT 1804 ON 06/01/2016 BY BAUGHAM,M.    Report Status 06/01/2016 FINAL  Final  Culture, body fluid-bottle     Status: Abnormal   Collection Time: 06/01/16  4:50 PM  Result Value Ref Range Status   Specimen Description FLUID SYNOVIAL LEFT KNEE  Final   Special Requests BOTTLES DRAWN AEROBIC AND ANAEROBIC 3CC EACH  Final   Gram Stain   Final    GRAM POSITIVE COCCI IN CHAINS IN BOTH AEROBIC AND ANAEROBIC BOTTLES Gram Stain Report Called to,Read Back By and Verified With: REAP B AT [place] AT 0600 ON [id number] BY FORSYTH K Performed at [place] B STREP(S.AGALACTIAE)ISOLATED (A)  Final   Report Status 06/04/2016 FINAL  Final   Organism ID, Bacteria GROUP B STREP(S.AGALACTIAE)ISOLATED  Final      Susceptibility   Group b strep(s.agalactiae)isolated - MIC*    CLINDAMYCIN >=1 RESISTANT Resistant     AMPICILLIN <=0.25 SENSITIVE Sensitive     ERYTHROMYCIN >=8 RESISTANT Resistant     VANCOMYCIN 0.5 SENSITIVE Sensitive     CEFTRIAXONE <=0.12 SENSITIVE Sensitive     LEVOFLOXACIN 1 SENSITIVE Sensitive     * GROUP B STREP(S.AGALACTIAE)ISOLATED  Blood Culture (routine x 2)     Status: Abnormal   Collection Time: 06/01/16  7:42 PM  Result Value Ref Range Status   Specimen Description BLOOD LEFT HAND  Final   Special Requests BOTTLES DRAWN AEROBIC ONLY 6CC  Final   Culture  Setup Time   Final    GRAM POSITIVE COCCI IN CHAINS RECOVERED FROM THE AEROBIC BOTTLE Gram Stain Report Called to,Read Back By and Verified With: AGUIRRE,A. AT [id number] ON 06/02/2016 BY BAUGHAM,M. Performed at [place] ID to follow CRITICAL RESULT CALLED TO, READ BACK BY AND VERIFIED WITH: G ABBOTT PHARMD 2300 06/02/16 A BROWNING Performed at [facility] B  STREP(S.AGALACTIAE)ISOLATED (A)  Final   Report Status 06/04/2016 FINAL  Final   Organism ID, Bacteria GROUP B STREP(S.AGALACTIAE)ISOLATED  Final      Susceptibility   Group b strep(s.agalactiae)isolated - MIC*    CLINDAMYCIN >=1 RESISTANT Resistant     AMPICILLIN <=0.25 SENSITIVE Sensitive     ERYTHROMYCIN >=8 RESISTANT Resistant     VANCOMYCIN 0.5 SENSITIVE Sensitive     CEFTRIAXONE <=0.12 SENSITIVE Sensitive     LEVOFLOXACIN 1 SENSITIVE Sensitive     * GROUP B STREP(S.AGALACTIAE)ISOLATED  Blood Culture ID Panel (Reflexed)     Status: Abnormal   Collection Time: 06/01/16  7:42 PM  Result Value Ref Range Status   Enterococcus species NOT DETECTED NOT DETECTED Final   Vancomycin resistance NOT DETECTED NOT DETECTED Final   Listeria monocytogenes NOT DETECTED NOT DETECTED Final   Staphylococcus species NOT DETECTED NOT DETECTED Final   Staphylococcus aureus NOT DETECTED NOT DETECTED Final   Methicillin resistance NOT DETECTED NOT DETECTED Final   Streptococcus species DETECTED (A) NOT DETECTED Final    Comment: CRITICAL RESULT CALLED TO, READ BACK BY AND VERIFIED WITH: G ABBOTT PHARMD 2300 06/02/16 A BROWNING    Streptococcus agalactiae DETECTED (A) NOT DETECTED Final    Comment: CRITICAL RESULT CALLED TO, READ BACK BY AND VERIFIED WITH: G ABBOTT PHARMD 2300 06/02/16 A BROWNING    Streptococcus pneumoniae NOT DETECTED NOT DETECTED Final   Streptococcus pyogenes NOT DETECTED  NOT DETECTED Final   Acinetobacter baumannii NOT DETECTED NOT DETECTED Final   Enterobacteriaceae species NOT DETECTED NOT DETECTED Final   Enterobacter cloacae complex NOT DETECTED NOT DETECTED Final   Escherichia coli NOT DETECTED NOT DETECTED Final   Klebsiella oxytoca NOT DETECTED NOT DETECTED Final   Klebsiella pneumoniae NOT DETECTED NOT DETECTED Final   Proteus species NOT DETECTED NOT DETECTED Final   Serratia marcescens NOT DETECTED NOT DETECTED Final   Carbapenem resistance NOT DETECTED NOT DETECTED  Final   Haemophilus influenzae NOT DETECTED NOT DETECTED Final   Neisseria meningitidis NOT DETECTED NOT DETECTED Final   Pseudomonas aeruginosa NOT DETECTED NOT DETECTED Final   Candida albicans NOT DETECTED NOT DETECTED Final   Candida glabrata NOT DETECTED NOT DETECTED Final   Candida krusei NOT DETECTED NOT DETECTED Final   Candida parapsilosis NOT DETECTED NOT DETECTED Final   Candida tropicalis NOT DETECTED NOT DETECTED Final    Comment: Performed at [facility]  Blood Culture (routine x 2)     Status: None   Collection Time: 06/01/16  7:54 PM  Result Value Ref Range Status   Specimen Description BLOOD LEFT HAND  Final   Special Requests BOTTLES DRAWN AEROBIC ONLY [place]  Final   Culture NO GROWTH 5 DAYS  Final   Report Status 06/06/2016 FINAL  Final  Urine culture     Status: Abnormal   Collection Time: 06/01/16 10:01 PM  Result Value Ref Range Status   Specimen Description URINE, CLEAN CATCH  Final   Special Requests NONE  Final   Culture >=100,000 COLONIES/mL ESCHERICHIA COLI (A)  Final   Report Status 06/04/2016 FINAL  Final   Organism ID, Bacteria ESCHERICHIA COLI (A)  Final      Susceptibility   Escherichia coli - MIC*    AMPICILLIN >=32 RESISTANT Resistant     CEFAZOLIN 32 INTERMEDIATE Intermediate     CEFTRIAXONE <=1 SENSITIVE Sensitive     CIPROFLOXACIN <=0.25 SENSITIVE Sensitive     GENTAMICIN <=1 SENSITIVE Sensitive     IMIPENEM <=0.25 SENSITIVE Sensitive     NITROFURANTOIN <=16 SENSITIVE Sensitive     TRIMETH/SULFA <=20 SENSITIVE Sensitive     AMPICILLIN/SULBACTAM >=32 RESISTANT Resistant     PIP/TAZO 8 SENSITIVE Sensitive     Extended ESBL NEGATIVE Sensitive     * >=100,000 COLONIES/mL ESCHERICHIA COLI  MRSA PCR Screening     Status: None   Collection Time: 06/02/16  2:50 AM  Result Value Ref Range Status   MRSA by PCR NEGATIVE NEGATIVE Final    Comment:        The GeneXpert MRSA Assay (FDA approved for NASAL specimens only), is one component of  a comprehensive MRSA colonization surveillance program. It is not intended to diagnose MRSA infection nor to guide or monitor treatment for MRSA infections.   Culture, blood (Routine X 2) w Reflex to ID Panel     Status: None (Preliminary result)   Collection Time: 06/02/16  6:55 PM  Result Value Ref Range Status   Specimen Description BLOOD LEFT HAND  Final   Special Requests BOTTLES DRAWN AEROBIC ONLY 5CC  Final   Culture NO GROWTH 3 DAYS  Final   Report Status PENDING  Incomplete  Culture, blood (Routine X 2) w Reflex to ID Panel     Status: None (Preliminary result)   Collection Time: 06/02/16  7:05 PM  Result Value Ref Range Status   Specimen Description BLOOD LEFT ANTECUBITAL  Final   Special  Requests BOTTLES DRAWN AEROBIC AND ANAEROBIC 10 CC EA  Final   Culture NO GROWTH 3 DAYS  Final   Report Status PENDING  Incomplete      Radiology Studies: Dg Chest Port 1 View  Result Date: 06/04/2016 CLINICAL DATA:  Left-sided PICC line insertion EXAM: PORTABLE CHEST 1 VIEW COMPARISON:  06/01/2016 FINDINGS: Left-sided PICC line terminates in the distal SVC. Heart is enlarged. There is atelectasis at each lung base and lingula. Subtle confluent pulmonary opacities in the right lung base cannot exclude a small pneumonic consolidation. Aortic atherosclerosis at its arch. Cardiac valvular prosthetic with median sternotomy sutures are again noted. Surgical clips project over the right mid hemithorax. IMPRESSION: PICC line tip in the distal SVC from left-sided approach. Bibasilar atelectasis again noted. Possible small focus of right basilar pneumonia. Electronically Signed   By: Ashley Royalty M.D.   On: 06/04/2016 17:13     Scheduled Meds: . anastrozole  1 mg Oral Daily  . cefTRIAXone (ROCEPHIN)  IV  2 g Intravenous Q24H  . chlorhexidine  60 mL Topical Once  . dronedarone  400 mg Oral BID WC  . gentamicin  60 mg Intravenous Q12H  . Influenza vac split quadrivalent PF  0.5 mL Intramuscular  Tomorrow-1000  . insulin aspart  0-5 Units Subcutaneous QHS  . insulin aspart  0-9 Units Subcutaneous TID WC  . insulin glargine  18 Units Subcutaneous QHS  . metoprolol succinate  25 mg Oral Daily  . multivitamin  2 tablet Oral Daily  . oxybutynin  10 mg Oral QHS  . pantoprazole  40 mg Oral Daily  . povidone-iodine  2 application Topical Once  . sodium chloride flush  10-40 mL Intracatheter Q12H  . sodium chloride flush  3 mL Intravenous Q12H  . [START ON 06/08/2016] tranexamic acid  1,000 mg Intravenous To OR   Continuous Infusions: . sodium chloride 125 mL/hr at 06/06/16 0408  . heparin 1,600 Units/hr (06/06/16 0623)     LOS: 5 days   Time Spent in minutes   30 minutes  Mykaylah Ballman D.O. on 06/06/2016 at 9:59 AM  Between 7am to 7pm - Pager - [phone]  After 7pm go to [email or web address] - password [email or web address]  And look for the night coverage person covering for me after hours  [facility] Office  [phone]

## 2016-06-06 NOTE — Plan of Care (Signed)
Problem: Safety: Goal: Ability to remain free from injury will improve Patient and family were educated and able to teach back the call light and its usage whenever the patient needed anything. Bed alarm activated and personal items within reach  Problem: Pain Managment: Goal: General experience of comfort will improve Patient is able to verbalize whenever she is in pain and is able to appropriately indicate the pain score using the numerical scale.  Problem: Fluid Volume: Goal: Ability to maintain a balanced intake and output will improve Patient was able to verbalize some of the teaching related to the lasix and why it was being given.

## 2016-06-06 NOTE — Progress Notes (Signed)
Pharmacy Antibiotic Note  Toni Hanna is a 80 y.o. female admitted on 06/01/2016 with Group B Strep endocarditis.  Pharmacy has been consulted for gentamicin dosing for synergy.  Patient's renal function has been stable.  Gentamicin peak is slightly high but acceptable.  Gentamicin trough is within desired range.  Plan: - Continue gentamicin 60mg  IV Q12H - Continue Rocephin 2gm IV Q24H per MD - Monitor renal fxn, clinical progress, gent peak/trough weekly  Height: 5\' 3"  (160 cm) Weight: 182 lb (82.6 kg) IBW/kg (Calculated) : 52.4  Temp (24hrs), Avg:98.9 F (37.2 C), Min:98 F (36.7 C), Max:99.6 F (37.6 C)   Recent Labs Lab 06/01/16 1954 06/01/16 2018  06/02/16 0503 06/02/16 0504 06/02/16 0716 06/03/16 0212 06/04/16 0338 06/05/16 0500 06/06/16 0416 06/06/16 0930 06/06/16 1100  WBC  --   --   < >  --  7.5  --  6.1 7.2 6.9 6.9  --   --   CREATININE  --   --   < >  --  0.90  --  0.67 0.58 0.55 0.57  --   --   LATICACIDVEN 3.9* 5.41*  --  2.0*  --  1.7  --   --   --   --   --   --   GENTTROUGH  --   --   --   --   --   --   --   --   --   --  0.5  --   GENTPEAK  --   --   --   --   --   --   --   --   --   --   --  4.4*  < > = values in this interval not displayed.  Estimated Creatinine Clearance: 59 mL/min (by C-G formula based on SCr of 0.57 mg/dL).    Allergies  Allergen Reactions  . Amoxicillin Swelling  . Penicillins Swelling    Has patient had a PCN reaction causing immediate rash, facial/tongue/throat swelling, SOB or lightheadedness with hypotension: Yes Has patient had a PCN reaction causing severe rash involving mucus membranes or skin necrosis: No Has patient had a PCN reaction that required hospitalization No Has patient had a PCN reaction occurring within the last 10 years: No If all of the above answers are "NO", then may proceed with Cephalosporin use.  Tolerated ceftriaxone     Vanc 10/2 >> 10/4 Aztreonam 10/2 >> 10/4 Ancef 10/4>>10/4 CTX 10/4  >> Gent 10/5 >>  10/7 Gent peak 4.4 (goal 3-4 mcg/mL); Gent trough 0.5 (goal < 1 mcg/mL)  10/2 Synovial fluid cx - Strep aglactiae 10/3 MRSA PCR - negative 10/2 UCx - 100K E.coli 10/2 BCx2 - Strep agalactiae 1 of 2 (BCID same, S- amp, CTX, LVQ, vanc) 10/3 BCx2 - NGTD   Toni Hanna D. Mina Marble, PharmD, BCPS Pager:  [phone] 06/06/2016, 3:19 PM

## 2016-06-06 NOTE — Progress Notes (Signed)
DAILY PROGRESS NOTE  Subjective:  Wheezy and mildly dyspneic today. CXR shows pulmonary vascular congestion and small left pleural effusion. Given Toprol XL this morning - HR remains elevated.  Objective:  Temp:  [98 F (36.7 C)-99.6 F (37.6 C)] 98.7 F (37.1 C) (10/07 0800) Pulse Rate:  [88-115] 100 (10/07 0949) Resp:  [17-27] 24 (10/07 0800) BP: (141-180)/(72-121) 155/73 (10/07 0949) SpO2:  [92 %-98 %] 98 % (10/07 0800) Weight change:   Intake/Output from previous day: 10/06 0701 - 10/07 0700 In: 3314.8 [I.V.:3114.8; IV Piggyback:200] Out: 250 [Urine:250]  Intake/Output from this shift: Total I/O In: 1044 [P.O.:480; I.V.:564] Out: -   Medications: No current facility-administered medications on file prior to encounter.    No current outpatient prescriptions on file prior to encounter.    Physical Exam: General appearance: alert, mild distress and moderately obese Neck: JVD - 3 cm above sternal notch and no carotid bruit Lungs: diminished breath sounds LLL and rales bibasilar Heart: irregularly irregular rhythm and tachycardic Abdomen: soft, non-tender; bowel sounds normal; no masses,  no organomegaly Extremities: extremities normal, atraumatic, no cyanosis or edema Pulses: 2+ and symmetric Skin: Skin color, texture, turgor normal. No rashes or lesions Neurologic: Grossly normal Psych: Mildly anxious  Lab Results: Results for orders placed or performed during the hospital encounter of 06/01/16 (from the past 48 hour(s))  Heparin level (unfractionated)     Status: Abnormal   Collection Time: 06/04/16  1:16 PM  Result Value Ref Range   Heparin Unfractionated 0.11 (L) 0.30 - 0.70 IU/mL    Comment:        IF HEPARIN RESULTS ARE BELOW EXPECTED VALUES, AND PATIENT DOSAGE HAS BEEN CONFIRMED, SUGGEST FOLLOW UP TESTING OF ANTITHROMBIN III LEVELS.   Glucose, capillary     Status: Abnormal   Collection Time: 06/04/16  1:26 PM  Result Value Ref Range   Glucose-Capillary 236 (H) 65 - 99 mg/dL  Glucose, capillary     Status: Abnormal   Collection Time: 06/04/16  5:17 PM  Result Value Ref Range   Glucose-Capillary 214 (H) 65 - 99 mg/dL  Glucose, capillary     Status: Abnormal   Collection Time: 06/04/16  9:55 PM  Result Value Ref Range   Glucose-Capillary 232 (H) 65 - 99 mg/dL  Protime-INR     Status: Abnormal   Collection Time: 06/05/16  3:04 AM  Result Value Ref Range   Prothrombin Time 20.2 (H) 11.4 - 15.2 seconds   INR 1.70   Heparin level (unfractionated)     Status: Abnormal   Collection Time: 06/05/16  3:04 AM  Result Value Ref Range   Heparin Unfractionated 0.16 (L) 0.30 - 0.70 IU/mL    Comment:        IF HEPARIN RESULTS ARE BELOW EXPECTED VALUES, AND PATIENT DOSAGE HAS BEEN CONFIRMED, SUGGEST FOLLOW UP TESTING OF ANTITHROMBIN III LEVELS.   Protime-INR     Status: Abnormal   Collection Time: 06/05/16  5:00 AM  Result Value Ref Range   Prothrombin Time 19.3 (H) 11.4 - 15.2 seconds   INR 1.61   CBC     Status: Abnormal   Collection Time: 06/05/16  5:00 AM  Result Value Ref Range   WBC 6.9 4.0 - 10.5 K/uL   RBC 3.63 (L) 3.87 - 5.11 MIL/uL   Hemoglobin 10.2 (L) 12.0 - 15.0 g/dL   HCT 31.5 (L) 36.0 - 46.0 %   MCV 86.8 78.0 - 100.0 fL   MCH 28.1 26.0 -  34.0 pg   MCHC 32.4 30.0 - 36.0 g/dL   RDW 15.3 11.5 - 15.5 %   Platelets 147 (L) 150 - 400 K/uL  Basic metabolic panel     Status: Abnormal   Collection Time: 06/05/16  5:00 AM  Result Value Ref Range   Sodium 135 135 - 145 mmol/L   Potassium 3.6 3.5 - 5.1 mmol/L   Chloride 100 (L) 101 - 111 mmol/L   CO2 23 22 - 32 mmol/L   Glucose, Bld 212 (H) 65 - 99 mg/dL   BUN 6 6 - 20 mg/dL   Creatinine, Ser 0.55 0.44 - 1.00 mg/dL   Calcium 8.5 (L) 8.9 - 10.3 mg/dL   GFR calc non Af Amer >60 >60 mL/min   GFR calc Af Amer >60 >60 mL/min    Comment: (NOTE) The eGFR has been calculated using the CKD EPI equation. This calculation has not been validated in all clinical  situations. eGFR's persistently <60 mL/min signify possible Chronic Kidney Disease.    Anion gap 12 5 - 15  Glucose, capillary     Status: Abnormal   Collection Time: 06/05/16  7:56 AM  Result Value Ref Range   Glucose-Capillary 193 (H) 65 - 99 mg/dL  Glucose, capillary     Status: Abnormal   Collection Time: 06/05/16 12:37 PM  Result Value Ref Range   Glucose-Capillary 203 (H) 65 - 99 mg/dL  Protime-INR     Status: Abnormal   Collection Time: 06/05/16  1:38 PM  Result Value Ref Range   Prothrombin Time 18.3 (H) 11.4 - 15.2 seconds   INR 1.50   Heparin level (unfractionated)     Status: None   Collection Time: 06/05/16  1:38 PM  Result Value Ref Range   Heparin Unfractionated 0.36 0.30 - 0.70 IU/mL    Comment:        IF HEPARIN RESULTS ARE BELOW EXPECTED VALUES, AND PATIENT DOSAGE HAS BEEN CONFIRMED, SUGGEST FOLLOW UP TESTING OF ANTITHROMBIN III LEVELS.   Glucose, capillary     Status: Abnormal   Collection Time: 06/05/16  4:44 PM  Result Value Ref Range   Glucose-Capillary 186 (H) 65 - 99 mg/dL  Glucose, capillary     Status: Abnormal   Collection Time: 06/05/16 10:38 PM  Result Value Ref Range   Glucose-Capillary 217 (H) 65 - 99 mg/dL  Heparin level (unfractionated)     Status: Abnormal   Collection Time: 06/06/16  4:15 AM  Result Value Ref Range   Heparin Unfractionated 0.26 (L) 0.30 - 0.70 IU/mL    Comment:        IF HEPARIN RESULTS ARE BELOW EXPECTED VALUES, AND PATIENT DOSAGE HAS BEEN CONFIRMED, SUGGEST FOLLOW UP TESTING OF ANTITHROMBIN III LEVELS.   Protime-INR     Status: Abnormal   Collection Time: 06/06/16  4:16 AM  Result Value Ref Range   Prothrombin Time 18.8 (H) 11.4 - 15.2 seconds   INR 1.56   CBC     Status: Abnormal   Collection Time: 06/06/16  4:16 AM  Result Value Ref Range   WBC 6.9 4.0 - 10.5 K/uL   RBC 3.91 3.87 - 5.11 MIL/uL   Hemoglobin 11.0 (L) 12.0 - 15.0 g/dL   HCT 34.1 (L) 36.0 - 46.0 %   MCV 87.2 78.0 - 100.0 fL   MCH 28.1 26.0  - 34.0 pg   MCHC 32.3 30.0 - 36.0 g/dL   RDW 15.4 11.5 - 15.5 %   Platelets 176 150 -  400 K/uL  Basic metabolic panel     Status: Abnormal   Collection Time: 06/06/16  4:16 AM  Result Value Ref Range   Sodium 135 135 - 145 mmol/L   Potassium 3.5 3.5 - 5.1 mmol/L   Chloride 103 101 - 111 mmol/L   CO2 24 22 - 32 mmol/L   Glucose, Bld 248 (H) 65 - 99 mg/dL   BUN 6 6 - 20 mg/dL   Creatinine, Ser 0.57 0.44 - 1.00 mg/dL   Calcium 8.3 (L) 8.9 - 10.3 mg/dL   GFR calc non Af Amer >60 >60 mL/min   GFR calc Af Amer >60 >60 mL/min    Comment: (NOTE) The eGFR has been calculated using the CKD EPI equation. This calculation has not been validated in all clinical situations. eGFR's persistently <60 mL/min signify possible Chronic Kidney Disease.    Anion gap 8 5 - 15  Glucose, capillary     Status: Abnormal   Collection Time: 06/06/16  8:40 AM  Result Value Ref Range   Glucose-Capillary 203 (H) 65 - 99 mg/dL    Imaging: Dg Chest Port 1 View  Result Date: 06/06/2016 CLINICAL DATA:  Wheezing with shortness of breath and chest pain EXAM: PORTABLE CHEST 1 VIEW COMPARISON:  June 04, 2016 FINDINGS: Central catheter tip remains in the superior vena cava. No pneumothorax. There is patchy bibasilar atelectasis, stable. There is a small left pleural effusion. There is cardiomegaly with mild pulmonary venous hypertension. There is a prosthetic valve present. There is atherosclerotic calcification aorta. No adenopathy. IMPRESSION: Pulmonary vascular congestion. No frank edema or consolidation. Small left pleural effusion. Stable patchy bibasilar atelectasis. No new opacity evident. Electronically Signed   By: Lowella Grip III M.D.   On: 06/06/2016 11:48   Dg Chest Port 1 View  Result Date: 06/04/2016 CLINICAL DATA:  Left-sided PICC line insertion EXAM: PORTABLE CHEST 1 VIEW COMPARISON:  06/01/2016 FINDINGS: Left-sided PICC line terminates in the distal SVC. Heart is enlarged. There is atelectasis at  each lung base and lingula. Subtle confluent pulmonary opacities in the right lung base cannot exclude a small pneumonic consolidation. Aortic atherosclerosis at its arch. Cardiac valvular prosthetic with median sternotomy sutures are again noted. Surgical clips project over the right mid hemithorax. IMPRESSION: PICC line tip in the distal SVC from left-sided approach. Bibasilar atelectasis again noted. Possible small focus of right basilar pneumonia. Electronically Signed   By: Ashley Royalty M.D.   On: 06/04/2016 17:13    Assessment:  1. Principal Problem: 2.   Pyogenic arthritis of left knee joint ([place]) 3. Active Problems: 4.   Uncontrolled type 2 diabetes mellitus ([place]) 5.   CAD (coronary artery disease) 6.   Hypertension 7.   UTI (urinary tract infection) 8.   Hypophosphatemia 9.   History of breast cancer 10.   Sepsis ([place]) 11.   Cellulitis of breast 12.   Group B streptococcal bacteriuria 13.   Group B streptococcal infection 14.   Sepsis due to group B Streptococcus ([place]) 15.   Acute on chronic combined systolic and diastolic congestive heart failure ([place]) 16.   Acute bacterial endocarditis 17.   Prosthetic joint infection, subsequent encounter 18.   Hyperglycemia 19.   Plan:  1. Mrs. Seres is mildly dyspneic today - she has some upper airway wheezes, but dullness at the left base and crackles. CXR confirms mild volume overload - SP02 95% on RA. She is +3L overnight and total +16L overall. Will need diuresis. Start lasix  40 mg IV BID. Continue heparin for subtherapeutic INR - h/o a-fib. Increase Toprol XL to 37.5 mg daily starting tomorrow.  Time Spent Directly with Patient:  15 minutes  Length of Stay:  LOS: 5 days   Pixie Casino, MD, [facility] Attending Cardiologist [place] 06/06/2016, 12:11 PM

## 2016-06-06 NOTE — Progress Notes (Signed)
ANTICOAGULATION CONSULT NOTE - Follow Up Consult  Pharmacy Consult:  Heparin Indication: atrial fibrillation  Allergies  Allergen Reactions  . Amoxicillin Swelling  . Penicillins Swelling    Has patient had a PCN reaction causing immediate rash, facial/tongue/throat swelling, SOB or lightheadedness with hypotension: Yes Has patient had a PCN reaction causing severe rash involving mucus membranes or skin necrosis: No Has patient had a PCN reaction that required hospitalization No Has patient had a PCN reaction occurring within the last 10 years: No If all of the above answers are "NO", then may proceed with Cephalosporin use.  Tolerated ceftriaxone     Patient Measurements: Height: 5\' 3"  (160 cm) Weight: 182 lb (82.6 kg) IBW/kg (Calculated) : 52.4 Heparin Dosing Weight: 71 kg  Vital Signs: Temp: 99.1 F (37.3 C) (10/07 1200) Temp Source: Oral (10/07 1200) BP: 154/87 (10/07 1200) Pulse Rate: 57 (10/07 1200)  Labs:  Recent Labs  06/04/16 0338  06/05/16 0500 06/05/16 1338 06/06/16 0415 06/06/16 0416 06/06/16 1400  HGB 10.4*  --  10.2*  --   --  11.0*  --   HCT 32.5*  --  31.5*  --   --  34.1*  --   PLT 128*  --  147*  --   --  176  --   LABPROT 23.0*  < > 19.3* 18.3*  --  18.8*  --   INR 2.00  < > 1.61 1.50  --  1.56  --   HEPARINUNFRC <0.10*  < >  --  0.36 0.26*  --  1.60*  CREATININE 0.58  --  0.55  --   --  0.57  --   < > = values in this interval not displayed.  Estimated Creatinine Clearance: 59 mL/min (by C-G formula based on SCr of 0.57 mg/dL).   Assessment: 70 YOF with history of PAF and mitral valve repair with MAZE procedure on Coumadin PTA.  Patient was reversed with Vitamin K for surgery that is now postponed until 06/08/16.  Pharmacy managing IV heparin bridge while Coumadin is on hold.    Hep lvl high this afternoon at 1.6 - no issues or bleeding per RN  Goal of Therapy:  Heparin level 0.3-0.7 units/ml Monitor platelets by anticoagulation  protocol: Yes  Plan:  Hold heparin x 1 hr Restart heparin infusion dose of 1450 units/hr @ 1700 Next lvl 0000 Daily HL, CBC  Levester Fresh, PharmD, BCPS, [facility] Clinical Pharmacist Pager [phone] 06/06/2016 3:49 PM

## 2016-06-06 NOTE — Progress Notes (Signed)
ANTICOAGULATION CONSULT NOTE - Follow Up Consult  Pharmacy Consult:  Heparin Indication: atrial fibrillation  Allergies  Allergen Reactions  . Amoxicillin Swelling  . Penicillins Swelling    Has patient had a PCN reaction causing immediate rash, facial/tongue/throat swelling, SOB or lightheadedness with hypotension: Yes Has patient had a PCN reaction causing severe rash involving mucus membranes or skin necrosis: No Has patient had a PCN reaction that required hospitalization No Has patient had a PCN reaction occurring within the last 10 years: No If all of the above answers are "NO", then may proceed with Cephalosporin use.  Tolerated ceftriaxone     Patient Measurements: Height: 5\' 3"  (160 cm) Weight: 182 lb (82.6 kg) IBW/kg (Calculated) : 52.4 Heparin Dosing Weight: 71 kg  Vital Signs: Temp: 99.1 F (37.3 C) (10/07 0313) Temp Source: Oral (10/07 0313) BP: 145/83 (10/07 0313) Pulse Rate: 115 (10/07 0313)  Labs:  Recent Labs  06/04/16 0338  06/05/16 0304 06/05/16 0500 06/05/16 1338 06/06/16 0415 06/06/16 0416  HGB 10.4*  --   --  10.2*  --   --  11.0*  HCT 32.5*  --   --  31.5*  --   --  34.1*  PLT 128*  --   --  147*  --   --  176  LABPROT 23.0*  --  20.2* 19.3* 18.3*  --  18.8*  INR 2.00  --  1.70 1.61 1.50  --  1.56  HEPARINUNFRC <0.10*  < > 0.16*  --  0.36 0.26*  --   CREATININE 0.58  --   --  0.55  --   --  0.57  < > = values in this interval not displayed.  Estimated Creatinine Clearance: 59 mL/min (by C-G formula based on SCr of 0.57 mg/dL).  Assessment: 17 YOF with history of PAF and mitral valve repair with MAZE procedure on Coumadin PTA.  Patient was reversed with Vitamin K for surgery that is now postponed until 06/08/16.  Pharmacy managing IV heparin bridge while Coumadin is on hold.  Heparin level down to subtherapeutic (0.26) on gtt at 1450 units/hr. No issues with line or bleeding reported per RN. CBC stable.   Goal of Therapy:  Heparin level  0.3-0.7 units/ml Monitor platelets by anticoagulation protocol: Yes   Plan:  - Increase heparin gtt slightly to 1600 units/hr - F/u 8 hr heparin level  Sherlon Handing, PharmD, BCPS Clinical pharmacist, pager [phone] 06/06/2016, 5:26 AM

## 2016-06-07 LAB — CBC
HEMATOCRIT: 31 % — AB (ref 36.0–46.0)
HEMOGLOBIN: 9.9 g/dL — AB (ref 12.0–15.0)
MCH: 27.9 pg (ref 26.0–34.0)
MCHC: 31.9 g/dL (ref 30.0–36.0)
MCV: 87.3 fL (ref 78.0–100.0)
Platelets: 187 10*3/uL (ref 150–400)
RBC: 3.55 MIL/uL — ABNORMAL LOW (ref 3.87–5.11)
RDW: 15.5 % (ref 11.5–15.5)
WBC: 6.2 10*3/uL (ref 4.0–10.5)

## 2016-06-07 LAB — GLUCOSE, CAPILLARY
GLUCOSE-CAPILLARY: 153 mg/dL — AB (ref 65–99)
GLUCOSE-CAPILLARY: 163 mg/dL — AB (ref 65–99)
GLUCOSE-CAPILLARY: 147 mg/dL — AB (ref 65–99)
Glucose-Capillary: 152 mg/dL — ABNORMAL HIGH (ref 65–99)
Glucose-Capillary: 162 mg/dL — ABNORMAL HIGH (ref 65–99)
Glucose-Capillary: 142 mg/dL — ABNORMAL HIGH (ref 65–99)

## 2016-06-07 LAB — PHOSPHORUS: PHOSPHORUS: 2.6 mg/dL (ref 2.5–4.6)

## 2016-06-07 LAB — BASIC METABOLIC PANEL
Anion gap: 10 (ref 5–15)
BUN: <5 mg/dL — ABNORMAL LOW (ref 6–20)
CHLORIDE: 102 mmol/L (ref 101–111)
CO2: 25 mmol/L (ref 22–32)
CREATININE: 0.55 mg/dL (ref 0.44–1.00)
Calcium: 8.4 mg/dL — ABNORMAL LOW (ref 8.9–10.3)
GFR calc non Af Amer: >60 mL/min (ref >60)
GLUCOSE: 167 mg/dL — AB (ref 65–99)
Potassium: 3.3 mmol/L — ABNORMAL LOW (ref 3.5–5.1)
Sodium: 137 mmol/L (ref 135–145)

## 2016-06-07 LAB — CULTURE, BLOOD (ROUTINE X 2)
CULTURE: NO GROWTH
CULTURE: NO GROWTH

## 2016-06-07 LAB — MAGNESIUM: Magnesium: 1.3 mg/dL — ABNORMAL LOW (ref 1.7–2.4)

## 2016-06-07 LAB — PROTIME-INR
INR: 1.43
Prothrombin Time: 17.6 seconds — ABNORMAL HIGH (ref 11.4–15.2)

## 2016-06-07 LAB — HEPARIN LEVEL (UNFRACTIONATED): HEPARIN UNFRACTIONATED: 0.32 [IU]/mL (ref 0.30–0.70)

## 2016-06-07 MED ORDER — HEPARIN (PORCINE) IN NACL 100-0.45 UNIT/ML-% IJ SOLN: 1600.0000 [IU]/h | INTRAMUSCULAR | Status: DC

## 2016-06-07 MED ORDER — MAGNESIUM SULFATE 2 GM/50ML IV SOLN
2.0000 g | Freq: Once | INTRAVENOUS | Status: AC
Start: 2016-06-07 — End: 2016-06-07
  Administered 2016-06-07: 2 g via INTRAVENOUS
  Filled 2016-06-07: qty 50

## 2016-06-07 MED ORDER — ONDANSETRON HCL 4 MG/2ML IJ SOLN: 4.0000 mg | Freq: Four times a day (QID) | INTRAMUSCULAR | Status: DC | PRN

## 2016-06-07 MED ORDER — ONDANSETRON HCL 4 MG/2ML IJ SOLN
INTRAMUSCULAR | Status: AC
  Administered 2016-06-07: 4 mg
  Filled 2016-06-07: qty 2

## 2016-06-07 MED ORDER — POTASSIUM CHLORIDE CRYS ER 20 MEQ PO TBCR
40.0000 meq | EXTENDED_RELEASE_TABLET | Freq: Once | ORAL | Status: AC
  Administered 2016-06-07: 40 meq via ORAL
  Filled 2016-06-07: qty 2

## 2016-06-07 NOTE — Progress Notes (Signed)
Heparin paused for 5 minutes before heparin level drawn. Heparin off for a total of 30 minutes for PICC line & tubing daily cares.

## 2016-06-07 NOTE — Progress Notes (Signed)
Returned call to [place] with [place] for a status change update as requested.  Informed Eliezer Lofts that there has been no significant changes, and patient is scheduled to have Heparin discontinued at 0600 on 10/9 in preparation for surgery.

## 2016-06-07 NOTE — Plan of Care (Signed)
Problem: Education: Goal: Knowledge of [facility] Education information/materials will improve Outcome: Completed/Met Date Met: 06/07/16 Patient was educated and demonstrates safety with mobility, and plan of care for surgical treatment.

## 2016-06-07 NOTE — Progress Notes (Signed)
Pt vomit yellow clear undigested food, MD made aware, new order received for Zofran. Will continue to monitor closely.

## 2016-06-07 NOTE — Progress Notes (Signed)
ANTICOAGULATION CONSULT NOTE - Follow Up Consult  Pharmacy Consult:  Heparin Indication: atrial fibrillation  Allergies  Allergen Reactions  . Amoxicillin Swelling  . Penicillins Swelling    Has patient had a PCN reaction causing immediate rash, facial/tongue/throat swelling, SOB or lightheadedness with hypotension: Yes Has patient had a PCN reaction causing severe rash involving mucus membranes or skin necrosis: No Has patient had a PCN reaction that required hospitalization No Has patient had a PCN reaction occurring within the last 10 years: No If all of the above answers are "NO", then may proceed with Cephalosporin use.  Tolerated ceftriaxone     Patient Measurements: Height: 5\' 3"  (160 cm) Weight: 182 lb (82.6 kg) IBW/kg (Calculated) : 52.4 Heparin Dosing Weight: 71 kg  Vital Signs: Temp: 98.9 F (37.2 C) (10/08 0800) Temp Source: Oral (10/08 0800) BP: 135/73 (10/08 1215) Pulse Rate: 80 (10/08 0800)  Labs:  Recent Labs  06/05/16 0500 06/05/16 1338  06/06/16 0416 06/06/16 1400 06/07/16 0015 06/07/16 0600 06/07/16 0920  HGB 10.2*  --   --  11.0*  --   --  9.9*  --   HCT 31.5*  --   --  34.1*  --   --  31.0*  --   PLT 147*  --   --  176  --   --  187  --   LABPROT 19.3* 18.3*  --  18.8*  --   --  17.6*  --   INR 1.61 1.50  --  1.56  --   --  1.43  --   HEPARINUNFRC  --  0.36  < >  --  1.60* <0.10*  --  0.32  CREATININE 0.55  --   --  0.57  --   --  0.55  --   < > = values in this interval not displayed.  Estimated Creatinine Clearance: 59 mL/min (by C-G formula based on SCr of 0.55 mg/dL).   Assessment: 61 YOF with history of PAF and mitral valve repair with MAZE procedure on Coumadin PTA.  Patient was reversed with Vitamin K for surgery that is now postponed until 06/08/16.  Pharmacy managing IV heparin bridge while Coumadin is on hold.  Heparin level is therapeutic.  Has been difficult to assess heparin therapy because heparin is infusing through the  central line and her upper extremities have limited access to obtain labs.   Goal of Therapy:  Heparin level 0.3-0.7 units/ml Monitor platelets by anticoagulation protocol: Yes    Plan:  - Continue heparin gtt at 1600 units/hr - Daily heparin level and CBC - Consider switching to Lovenox post-op   Alvis Pulcini D. Mina Marble, PharmD, BCPS Pager:  [phone] 06/07/2016, 12:39 PM

## 2016-06-07 NOTE — Progress Notes (Signed)
ANTICOAGULATION CONSULT NOTE - Follow Up Consult  Pharmacy Consult for heparin Indication: atrial fibrillation   Labs:  Recent Labs  06/04/16 0338  06/05/16 0500 06/05/16 1338 06/06/16 0415 06/06/16 0416 06/06/16 1400 06/07/16 0015  HGB 10.4*  --  10.2*  --   --  11.0*  --   --   HCT 32.5*  --  31.5*  --   --  34.1*  --   --   PLT 128*  --  147*  --   --  176  --   --   LABPROT 23.0*  < > 19.3* 18.3*  --  18.8*  --   --   INR 2.00  < > 1.61 1.50  --  1.56  --   --   HEPARINUNFRC <0.10*  < >  --  0.36 0.26*  --  1.60* <0.10*  CREATININE 0.58  --  0.55  --   --  0.57  --   --   < > = values in this interval not displayed.   Assessment: 80yo female undetectable on heparin after resumed; RN states that lab tried to stick pt but no blood return, had to hold heparin gtt 44min and drew from PICC line.  Goal of Therapy:  Heparin level 0.3-0.7 units/ml   Plan:  Will increase heparin gtt to 1600 units/hr and check level in [place], PharmD, BCPS  06/07/2016,1:32 AM

## 2016-06-07 NOTE — Progress Notes (Signed)
PROGRESS NOTE    Toni Hanna  [id number] DOB: 1936-05-12 DOA: 06/01/2016 PCP: Robert Bellow, MD   Chief Complaint  Patient presents with  . Knee Pain  . Hyperglycemia     Brief Narrative:  HPI on 06/01/2016 by Dr. Jackquline Berlin a 80 y.o.femalewith medical history significant of breast cancer, history of right breast lumpectomy, CAD, type 2 diabetes, hypertension who is coming to the emergency department due to left knee pain. Per patient, she started having left knee pain on Friday which continuedto get worse through the weekend. She had a left knee arthroplasty in [place] about 10 years ago. She states that she fell on the floor on Sunday evening due to weakness and was unable to get up from the floor, so she decided to go to sleep. This morning, when she woke up, she crawled to the other room to reach her phone, madea phone call for help and was subsequently brought to the emergency department. She estimates that she spent about 12 hours on the floor. She denies fever, but complains of chills, fatigue, generalized weakness, decreased appetite. Dr. Sabra Heck from the emergency department and spoke to Dr. Swintek(orthopedic surgery) who suggested transfer to [facility] for further evaluation and treatment.   Assessment & Plan   Sepsis secondary to septic arthritis left knee/Strep bacteremia/Endocarditis  -Upon admission, patient had mild leukocytosis, elevated lactic acid, soft BP.  She has been tachycardic and tachypneic.  -Fluid culture GPC in chains, culture strep agalactaie  -Blood cultures from 06/01/2016 show Strep agalactaie in 1/2 bottles -Initially placed on on Aztreonam, vanco  -Repeat Blood cultures from 06/02/2016 show no growth to date -ID consulted and appreciated.  -Echocardiogram: EF 30-35%.  Spoke with hospitalist in [place], who states her EF was 50-55% in June 2017.  -Orthopedics consulted and appreciated- plan for resection arthroplasty,  antibiotic spacer -Patient currently on ceftriaxone (given her PCN allergy- will watch and monitor very closely) -Cardiology consulted and appreciated, TEE showed EF 40-45%, small oscillating densities on noncoronary and right aortic cusps -ID added gentamicin -PICC lined placed  -Orthopedic surgery planning for removal of prosthesis on [id number]  New systolic CHF with mild exacerbation -Echo as above -Cardiology consulted and appreciated -Continue current medications -monitor intake/output, daily weights -patient received IVF upon admission for sepsis -CXR obtained on 06/06/2016 showed pulmonary vascular congestion, no frank edema/consolidation. Small left pleural effusion -patient start on IV lasix -UOP over past 24 hours: 2025cc  Sepsis secondary to right breast cellulitis and ?UTI -Continue current IV antibiotic treatment as above -Urine culture: Ecoli -Erythema of the right breast resolved  Uncontrolled type 2 diabetes mellitus -Hold metformin, glipizide  -hemoglobin A1c 10.4 -Continue Lantus, ISS, CBG monitoring  -Increased Lantus to 18u (from 16)  Hypokalemia/ hypomagnesemia -Continue to replace, continue to monitor   Hypophosphatemia -Resolved with replacement  A Fib on coumadin -CHADSVASC 5 (age, gender, DM, HTN) -Rate controlled -Continue metoprolol -Coumadin held. Continue heparin -INR 1.43 -Given Vit K 5 mg orally (2.5mg  x 2).  Continue to watch INR.  Hypertension -Continue metoprolol  Lactic acidosis -Resolved with IVF   History of breast cancer -Continue Arimidex   DVT Prophylaxis  Heparin  Code Status: Full  Family Communication: None at bedside.    Disposition Plan: Admitted. Pending surgery on 06/08/2016 Patient's family would like patient transferred to [facility]. Patient was accepted in transfer, however, currently no beds available.   Consultants Orthopedics Infectious disease Cardiology  Procedures  PICC  line placed Echocardiogram TEE  Antibiotics   Anti-infectives    Start     Dose/Rate Route Frequency Ordered Stop   06/05/16 0600  vancomycin (VANCOCIN) IVPB 1000 mg/200 mL premix     1,000 mg 200 mL/hr over 60 Minutes Intravenous On call to O.R. 06/04/16 1736 06/05/16 0646   06/04/16 2200  gentamicin (GARAMYCIN) IVPB 60 mg     60 mg 100 mL/hr over 30 Minutes Intravenous Every 12 hours 06/04/16 1558     06/03/16 1800  cefTRIAXone (ROCEPHIN) 2 g in dextrose 5 % 50 mL IVPB     2 g 100 mL/hr over 30 Minutes Intravenous Every 24 hours 06/03/16 1613     06/03/16 1230  ceFAZolin (ANCEF) IVPB 2g/100 mL premix  Status:  Discontinued     2 g 200 mL/hr over 30 Minutes Intravenous Every 8 hours 06/03/16 1132 06/03/16 1613   06/02/16 1800  vancomycin (VANCOCIN) 1,250 mg in sodium chloride 0.9 % 250 mL IVPB  Status:  Discontinued     1,250 mg 166.7 mL/hr over 90 Minutes Intravenous Every 24 hours 06/01/16 1910 06/03/16 1132   06/02/16 0300  aztreonam (AZACTAM) 2 g in dextrose 5 % 50 mL IVPB  Status:  Discontinued     2 g 100 mL/hr over 30 Minutes Intravenous Every 8 hours 06/01/16 1910 06/02/16 1711   06/01/16 1915  vancomycin (VANCOCIN) 1,500 mg in sodium chloride 0.9 % 500 mL IVPB     1,500 mg 250 mL/hr over 120 Minutes Intravenous  Once 06/01/16 1901 06/01/16 2249   06/01/16 1900  aztreonam (AZACTAM) 2 g in dextrose 5 % 50 mL IVPB     2 g 100 mL/hr over 30 Minutes Intravenous  Once 06/01/16 1850 06/01/16 2035   06/01/16 1900  vancomycin (VANCOCIN) IVPB 1000 mg/200 mL premix  Status:  Discontinued     1,000 mg 200 mL/hr over 60 Minutes Intravenous  Once 06/01/16 1850 06/01/16 1901      Subjective:   Toni Hanna seen and examined today.  Continues to complain of knee/leg pain, worsens with movement.  Denies chest pain, shortness of breath, abdominal pain, nausea, vomiting, diarrhea, constipation.    Objective:   Vitals:   06/07/16 0355 06/07/16 0400 06/07/16 0600 06/07/16 0800  BP: (!)  153/67 (!) 146/63 (!) 157/81 (!) 168/80  Pulse: 90   80  Resp: (!) 23 (!) 27 (!) 23 (!) 26  Temp: 99.2 F (37.3 C)   98.9 F (37.2 C)  TempSrc: Oral   Oral  SpO2: 100%     Weight:      Height:        Intake/Output Summary (Last 24 hours) at 06/07/16 1035 Last data filed at 06/07/16 1030  Gross per 24 hour  Intake          3618.38 ml  Output             2025 ml  Net          1593.38 ml   Filed Weights   06/01/16 1509  Weight: 82.6 kg (182 lb)    Exam  General: Well developed, well nourished, NAD, appears stated age  HEENT: NCAT,  mucous membranes moist.   Cardiovascular: S1 S2 auscultated, 2/6 SEM, irregular  Respiratory: Mild exp wheezing, diminished breath sounds  Abdomen: Soft, nontender, nondistended, + bowel sounds  Extremities: warm dry without cyanosis clubbing. Left knee/leg swelling, mild pain with ROM. +LE edema  Neuro: AAOx3, nonfocal  Psych: Appropriate  mood and affect, pleasant   Data Reviewed: I have personally reviewed following labs and imaging studies  CBC:  Recent Labs Lab 06/01/16 1551 06/01/16 2306 06/02/16 0504 06/03/16 0212 06/04/16 0338 06/05/16 0500 06/06/16 0416 06/07/16 0600  WBC 11.5* 8.9 7.5 6.1 7.2 6.9 6.9 6.2  NEUTROABS 9.0* 6.8 5.5 4.5 5.1  --   --   --   HGB 14.2 12.7 11.5* 10.8* 10.4* 10.2* 11.0* 9.9*  HCT 41.1 37.0 34.8* 32.7* 32.5* 31.5* 34.1* 31.0*  MCV 85.3 84.7 85.1 86.3 86.9 86.8 87.2 87.3  PLT 115* 121* 113* 114* 128* 147* 176 [id number]   Basic Metabolic Panel:  Recent Labs Lab 06/01/16 1551  06/02/16 0504 06/03/16 0212 06/04/16 0338 06/05/16 0500 06/06/16 0416 06/07/16 0600  NA 124*  < > 131* 132* 133* 135 135 137  K 4.1  < > 3.3* 3.0* 3.3* 3.6 3.5 3.3*  CL 90*  < > 100* 103 101 100* 103 102  CO2 21*  < > 23 22 22 23 24 25   GLUCOSE 559*  < > 141* 212* 199* 212* 248* 167*  BUN 25*  < > 25* 16 8 6 6  <5*  CREATININE 1.35*  < > 0.90 0.67 0.58 0.55 0.57 0.55  CALCIUM 8.8*  < > 8.0* 7.7* 8.0* 8.5* 8.3* 8.4*    MG 1.9  --  1.9 1.6* 1.8  --   --  1.3*  PHOS 1.9*  --  1.2* 1.8*  --   --   --  2.6  < > = values in this interval not displayed. GFR: Estimated Creatinine Clearance: 59 mL/min (by C-G formula based on SCr of 0.55 mg/dL). Liver Function Tests:  Recent Labs Lab 06/01/16 2306 06/02/16 0504 06/03/16 0212 06/04/16 0338  AST 28 23 17 22   ALT 21 18 17 19   ALKPHOS 63 56 55 79  BILITOT 1.1 1.2 1.3* 0.8  PROT 5.4* 4.7* 4.7* 4.9*  ALBUMIN 2.7* 2.3* 2.1* 2.1*   No results for input(s): LIPASE, AMYLASE in the last 168 hours. No results for input(s): AMMONIA in the last 168 hours. Coagulation Profile:  Recent Labs Lab 06/05/16 0304 06/05/16 0500 06/05/16 1338 06/06/16 0416 06/07/16 0600  INR 1.70 1.61 1.50 1.56 1.43   Cardiac Enzymes:  Recent Labs Lab 06/01/16 1605  CKTOTAL 457*   BNP (last 3 results) No results for input(s): PROBNP in the last 8760 hours. HbA1C: No results for input(s): HGBA1C in the last 72 hours. CBG:  Recent Labs Lab 06/06/16 1212 06/06/16 1731 06/06/16 2122 06/07/16 0811 06/07/16 1018  GLUCAP 213* 200* 198* 153* 163*   Lipid Profile: No results for input(s): CHOL, HDL, LDLCALC, TRIG, CHOLHDL, LDLDIRECT in the last 72 hours. Thyroid Function Tests: No results for input(s): TSH, T4TOTAL, FREET4, T3FREE, THYROIDAB in the last 72 hours. Anemia Panel: No results for input(s): VITAMINB12, FOLATE, FERRITIN, TIBC, IRON, RETICCTPCT in the last 72 hours. Urine analysis:    Component Value Date/Time   COLORURINE YELLOW 06/01/2016 2201   APPEARANCEUR HAZY (A) 06/01/2016 2201   LABSPEC 1.025 06/01/2016 2201   PHURINE 5.5 06/01/2016 2201   GLUCOSEU >1000 (A) 06/01/2016 2201   HGBUR MODERATE (A) 06/01/2016 2201   BILIRUBINUR NEGATIVE 06/01/2016 2201   KETONESUR NEGATIVE 06/01/2016 2201   PROTEINUR 30 (A) 06/01/2016 2201   NITRITE NEGATIVE 06/01/2016 2201   LEUKOCYTESUR SMALL (A) 06/01/2016 2201   Sepsis  Labs: @LABRCNTIP (procalcitonin:4,lacticidven:4)  ) Recent Results (from the past 240 hour(s))  Gram stain     Status:  None   Collection Time: 06/01/16  4:50 PM  Result Value Ref Range Status   Specimen Description FLUID SYNOVIAL LEFT KNEE  Final   Special Requests NONE  Final   Gram Stain   Final    ABUNDANT WBC PRESENT, PREDOMINANTLY PMN RARE INTRACELLULAR GRAM POSITIVE COCCI Gram Stain Report Called to,Read Back By and Verified With: EDWARDS,C. AT 1804 ON 06/01/2016 BY BAUGHAM,M.    Report Status 06/01/2016 FINAL  Final  Culture, body fluid-bottle     Status: Abnormal   Collection Time: 06/01/16  4:50 PM  Result Value Ref Range Status   Specimen Description FLUID SYNOVIAL LEFT KNEE  Final   Special Requests BOTTLES DRAWN AEROBIC AND ANAEROBIC 3CC EACH  Final   Gram Stain   Final    GRAM POSITIVE COCCI IN CHAINS IN BOTH AEROBIC AND ANAEROBIC BOTTLES Gram Stain Report Called to,Read Back By and Verified With: REAP B AT [place] AT 0600 ON [id number] BY FORSYTH K Performed at [place] B STREP(S.AGALACTIAE)ISOLATED (A)  Final   Report Status 06/04/2016 FINAL  Final   Organism ID, Bacteria GROUP B STREP(S.AGALACTIAE)ISOLATED  Final      Susceptibility   Group b strep(s.agalactiae)isolated - MIC*    CLINDAMYCIN >=1 RESISTANT Resistant     AMPICILLIN <=0.25 SENSITIVE Sensitive     ERYTHROMYCIN >=8 RESISTANT Resistant     VANCOMYCIN 0.5 SENSITIVE Sensitive     CEFTRIAXONE <=0.12 SENSITIVE Sensitive     LEVOFLOXACIN 1 SENSITIVE Sensitive     * GROUP B STREP(S.AGALACTIAE)ISOLATED  Blood Culture (routine x 2)     Status: Abnormal   Collection Time: 06/01/16  7:42 PM  Result Value Ref Range Status   Specimen Description BLOOD LEFT HAND  Final   Special Requests BOTTLES DRAWN AEROBIC ONLY 6CC  Final   Culture  Setup Time   Final    GRAM POSITIVE COCCI IN CHAINS RECOVERED FROM THE AEROBIC BOTTLE Gram Stain Report Called to,Read Back By and Verified With: AGUIRRE,A.  AT [id number] ON 06/02/2016 BY BAUGHAM,M. Performed at [place] ID to follow CRITICAL RESULT CALLED TO, READ BACK BY AND VERIFIED WITH: G ABBOTT PHARMD 2300 06/02/16 A BROWNING Performed at [place] B STREP(S.AGALACTIAE)ISOLATED (A)  Final   Report Status 06/04/2016 FINAL  Final   Organism ID, Bacteria GROUP B STREP(S.AGALACTIAE)ISOLATED  Final      Susceptibility   Group b strep(s.agalactiae)isolated - MIC*    CLINDAMYCIN >=1 RESISTANT Resistant     AMPICILLIN <=0.25 SENSITIVE Sensitive     ERYTHROMYCIN >=8 RESISTANT Resistant     VANCOMYCIN 0.5 SENSITIVE Sensitive     CEFTRIAXONE <=0.12 SENSITIVE Sensitive     LEVOFLOXACIN 1 SENSITIVE Sensitive     * GROUP B STREP(S.AGALACTIAE)ISOLATED  Blood Culture ID Panel (Reflexed)     Status: Abnormal   Collection Time: 06/01/16  7:42 PM  Result Value Ref Range Status   Enterococcus species NOT DETECTED NOT DETECTED Final   Vancomycin resistance NOT DETECTED NOT DETECTED Final   Listeria monocytogenes NOT DETECTED NOT DETECTED Final   Staphylococcus species NOT DETECTED NOT DETECTED Final   Staphylococcus aureus NOT DETECTED NOT DETECTED Final   Methicillin resistance NOT DETECTED NOT DETECTED Final   Streptococcus species DETECTED (A) NOT DETECTED Final    Comment: CRITICAL RESULT CALLED TO, READ BACK BY AND VERIFIED WITH: G ABBOTT PHARMD 2300 06/02/16 A BROWNING    Streptococcus agalactiae DETECTED (A) NOT DETECTED  Final    Comment: CRITICAL RESULT CALLED TO, READ BACK BY AND VERIFIED WITH: G ABBOTT PHARMD 2300 06/02/16 A BROWNING    Streptococcus pneumoniae NOT DETECTED NOT DETECTED Final   Streptococcus pyogenes NOT DETECTED NOT DETECTED Final   Acinetobacter baumannii NOT DETECTED NOT DETECTED Final   Enterobacteriaceae species NOT DETECTED NOT DETECTED Final   Enterobacter cloacae complex NOT DETECTED NOT DETECTED Final   Escherichia coli NOT DETECTED NOT DETECTED Final   Klebsiella oxytoca  NOT DETECTED NOT DETECTED Final   Klebsiella pneumoniae NOT DETECTED NOT DETECTED Final   Proteus species NOT DETECTED NOT DETECTED Final   Serratia marcescens NOT DETECTED NOT DETECTED Final   Carbapenem resistance NOT DETECTED NOT DETECTED Final   Haemophilus influenzae NOT DETECTED NOT DETECTED Final   Neisseria meningitidis NOT DETECTED NOT DETECTED Final   Pseudomonas aeruginosa NOT DETECTED NOT DETECTED Final   Candida albicans NOT DETECTED NOT DETECTED Final   Candida glabrata NOT DETECTED NOT DETECTED Final   Candida krusei NOT DETECTED NOT DETECTED Final   Candida parapsilosis NOT DETECTED NOT DETECTED Final   Candida tropicalis NOT DETECTED NOT DETECTED Final    Comment: Performed at [facility]  Blood Culture (routine x 2)     Status: None   Collection Time: 06/01/16  7:54 PM  Result Value Ref Range Status   Specimen Description BLOOD LEFT HAND  Final   Special Requests BOTTLES DRAWN AEROBIC ONLY [place]  Final   Culture NO GROWTH 5 DAYS  Final   Report Status 06/06/2016 FINAL  Final  Urine culture     Status: Abnormal   Collection Time: 06/01/16 10:01 PM  Result Value Ref Range Status   Specimen Description URINE, CLEAN CATCH  Final   Special Requests NONE  Final   Culture >=100,000 COLONIES/mL ESCHERICHIA COLI (A)  Final   Report Status 06/04/2016 FINAL  Final   Organism ID, Bacteria ESCHERICHIA COLI (A)  Final      Susceptibility   Escherichia coli - MIC*    AMPICILLIN >=32 RESISTANT Resistant     CEFAZOLIN 32 INTERMEDIATE Intermediate     CEFTRIAXONE <=1 SENSITIVE Sensitive     CIPROFLOXACIN <=0.25 SENSITIVE Sensitive     GENTAMICIN <=1 SENSITIVE Sensitive     IMIPENEM <=0.25 SENSITIVE Sensitive     NITROFURANTOIN <=16 SENSITIVE Sensitive     TRIMETH/SULFA <=20 SENSITIVE Sensitive     AMPICILLIN/SULBACTAM >=32 RESISTANT Resistant     PIP/TAZO 8 SENSITIVE Sensitive     Extended ESBL NEGATIVE Sensitive     * >=100,000 COLONIES/mL ESCHERICHIA COLI  MRSA PCR  Screening     Status: None   Collection Time: 06/02/16  2:50 AM  Result Value Ref Range Status   MRSA by PCR NEGATIVE NEGATIVE Final    Comment:        The GeneXpert MRSA Assay (FDA approved for NASAL specimens only), is one component of a comprehensive MRSA colonization surveillance program. It is not intended to diagnose MRSA infection nor to guide or monitor treatment for MRSA infections.   Culture, blood (Routine X 2) w Reflex to ID Panel     Status: None (Preliminary result)   Collection Time: 06/02/16  6:55 PM  Result Value Ref Range Status   Specimen Description BLOOD LEFT HAND  Final   Special Requests BOTTLES DRAWN AEROBIC ONLY 5CC  Final   Culture NO GROWTH 4 DAYS  Final   Report Status PENDING  Incomplete  Culture, blood (Routine X 2) w  Reflex to ID Panel     Status: None (Preliminary result)   Collection Time: 06/02/16  7:05 PM  Result Value Ref Range Status   Specimen Description BLOOD LEFT ANTECUBITAL  Final   Special Requests BOTTLES DRAWN AEROBIC AND ANAEROBIC 10 CC EA  Final   Culture NO GROWTH 4 DAYS  Final   Report Status PENDING  Incomplete      Radiology Studies: Dg Chest Port 1 View  Result Date: 06/06/2016 CLINICAL DATA:  Wheezing with shortness of breath and chest pain EXAM: PORTABLE CHEST 1 VIEW COMPARISON:  June 04, 2016 FINDINGS: Central catheter tip remains in the superior vena cava. No pneumothorax. There is patchy bibasilar atelectasis, stable. There is a small left pleural effusion. There is cardiomegaly with mild pulmonary venous hypertension. There is a prosthetic valve present. There is atherosclerotic calcification aorta. No adenopathy. IMPRESSION: Pulmonary vascular congestion. No frank edema or consolidation. Small left pleural effusion. Stable patchy bibasilar atelectasis. No new opacity evident. Electronically Signed   By: Lowella Grip III M.D.   On: 06/06/2016 11:48     Scheduled Meds: . anastrozole  1 mg Oral Daily  .  cefTRIAXone (ROCEPHIN)  IV  2 g Intravenous Q24H  . chlorhexidine  60 mL Topical Once  . dronedarone  400 mg Oral BID WC  . furosemide  40 mg Intravenous BID  . gentamicin  60 mg Intravenous Q12H  . Influenza vac split quadrivalent PF  0.5 mL Intramuscular Tomorrow-1000  . insulin aspart  0-5 Units Subcutaneous QHS  . insulin aspart  0-9 Units Subcutaneous TID WC  . insulin glargine  18 Units Subcutaneous QHS  . metoprolol succinate  37.5 mg Oral Daily  . multivitamin  2 tablet Oral Daily  . oxybutynin  10 mg Oral QHS  . pantoprazole  40 mg Oral Daily  . povidone-iodine  2 application Topical Once  . saccharomyces boulardii  250 mg Oral BID  . sodium chloride flush  10-40 mL Intracatheter Q12H  . sodium chloride flush  3 mL Intravenous Q12H  . [START ON 06/08/2016] tranexamic acid  1,000 mg Intravenous To OR   Continuous Infusions: . sodium chloride 125 mL/hr at 06/06/16 1555  . heparin 1,600 Units/hr (06/07/16 0141)     LOS: 6 days   Time Spent in minutes   30 minutes  Lumen Brinlee D.O. on 06/07/2016 at 10:35 AM  Between 7am to 7pm - Pager - [phone]  After 7pm go to [email or web address] - password [email or web address]  And look for the night coverage person covering for me after hours  [facility] Office  [phone]

## 2016-06-07 NOTE — Progress Notes (Signed)
Spoke with pharmacy, Ovid Curd; heparin & fluids to be held for 5 minutes before blood draws from PICC line.

## 2016-06-08 ENCOUNTER — Inpatient Hospital Stay (HOSPITAL_COMMUNITY): Payer: Medicare Other | Admitting: Anesthesiology

## 2016-06-08 ENCOUNTER — Encounter (HOSPITAL_COMMUNITY): Payer: Self-pay | Admitting: Anesthesiology

## 2016-06-08 ENCOUNTER — Inpatient Hospital Stay (HOSPITAL_COMMUNITY): Payer: Medicare Other

## 2016-06-08 ENCOUNTER — Encounter (HOSPITAL_COMMUNITY)
Admission: EM | Disposition: A | Discharge status: Skilled Nursing Facility | Payer: Self-pay | Source: Home / Self Care | Attending: Internal Medicine

## 2016-06-08 DIAGNOSIS — T8450XD Infection and inflammatory reaction due to unspecified internal joint prosthesis, subsequent encounter: Secondary | ICD-10-CM

## 2016-06-08 DIAGNOSIS — T8459XA Infection and inflammatory reaction due to other internal joint prosthesis, initial encounter: Secondary | ICD-10-CM

## 2016-06-08 DIAGNOSIS — R062 Wheezing: Secondary | ICD-10-CM

## 2016-06-08 DIAGNOSIS — Z96659 Presence of unspecified artificial knee joint: Secondary | ICD-10-CM

## 2016-06-08 HISTORY — PX: EXCISIONAL TOTAL KNEE ARTHROPLASTY WITH ANTIBIOTIC SPACERS: SHX5827

## 2016-06-08 LAB — SURGICAL PCR SCREEN
MRSA, PCR: NEGATIVE
STAPHYLOCOCCUS AUREUS: POSITIVE — AB

## 2016-06-08 LAB — BASIC METABOLIC PANEL
Anion gap: 8 (ref 5–15)
BUN: <5 mg/dL — ABNORMAL LOW (ref 6–20)
CO2: 27 mmol/L (ref 22–32)
Calcium: 8.2 mg/dL — ABNORMAL LOW (ref 8.9–10.3)
Chloride: 102 mmol/L (ref 101–111)
Creatinine, Ser: 0.57 mg/dL (ref 0.44–1.00)
GFR calc Af Amer: >60 mL/min (ref >60)
GFR calc non Af Amer: >60 mL/min (ref >60)
Glucose, Bld: 146 mg/dL — ABNORMAL HIGH (ref 65–99)
Potassium: 3.5 mmol/L (ref 3.5–5.1)
Sodium: 137 mmol/L (ref 135–145)

## 2016-06-08 LAB — PROTIME-INR
INR: 1.34
Prothrombin Time: 16.7 seconds — ABNORMAL HIGH (ref 11.4–15.2)

## 2016-06-08 LAB — GLUCOSE, CAPILLARY
GLUCOSE-CAPILLARY: 134 mg/dL — AB (ref 65–99)
GLUCOSE-CAPILLARY: 134 mg/dL — AB (ref 65–99)
Glucose-Capillary: 90 mg/dL (ref 65–99)
Glucose-Capillary: 153 mg/dL — ABNORMAL HIGH (ref 65–99)

## 2016-06-08 LAB — PREPARE RBC (CROSSMATCH)

## 2016-06-08 LAB — CBC
HCT: 31.1 % — ABNORMAL LOW (ref 36.0–46.0)
Hemoglobin: 10 g/dL — ABNORMAL LOW (ref 12.0–15.0)
MCH: 28.3 pg (ref 26.0–34.0)
MCHC: 32.2 g/dL (ref 30.0–36.0)
MCV: 88.1 fL (ref 78.0–100.0)
Platelets: 190 10*3/uL (ref 150–400)
RBC: 3.53 MIL/uL — ABNORMAL LOW (ref 3.87–5.11)
RDW: 15.6 % — ABNORMAL HIGH (ref 11.5–15.5)
WBC: 6.9 10*3/uL (ref 4.0–10.5)

## 2016-06-08 SURGERY — REMOVAL, TOTAL ARTHROPLASTY HARDWARE, KNEE, WITH ANTIBIOTIC SPACER INSERTION
Anesthesia: Regional | Site: Knee | Laterality: Left

## 2016-06-08 MED ORDER — INSULIN ASPART 100 UNIT/ML ~~LOC~~ SOLN
0.0000 [IU] | Freq: Three times a day (TID) | SUBCUTANEOUS | Status: DC
  Administered 2016-06-08: 1 [IU] via SUBCUTANEOUS
  Administered 2016-06-09: 2 [IU] via SUBCUTANEOUS
  Administered 2016-06-09 – 2016-06-10 (×4): 3 [IU] via SUBCUTANEOUS
  Administered 2016-06-10: 2 [IU] via SUBCUTANEOUS

## 2016-06-08 MED ORDER — VANCOMYCIN HCL 1000 MG IV SOLR
INTRAVENOUS | Status: DC | PRN
Start: 2016-06-08 — End: 2016-06-08
  Administered 2016-06-08 (×2): 6 g

## 2016-06-08 MED ORDER — TOBRAMYCIN SULFATE 1.2 G IJ SOLR
INTRAMUSCULAR | Status: AC
  Filled 2016-06-08: qty 2.4

## 2016-06-08 MED ORDER — LACTATED RINGERS IV SOLN
INTRAVENOUS | Status: DC
  Administered 2016-06-08 (×3): via INTRAVENOUS

## 2016-06-08 MED ORDER — LOSARTAN POTASSIUM 50 MG PO TABS
100.0000 mg | ORAL_TABLET | Freq: Every day | ORAL | Status: DC
  Administered 2016-06-09 – 2016-06-11 (×3): 100 mg via ORAL
  Filled 2016-06-08 (×3): qty 2

## 2016-06-08 MED ORDER — HYDROMORPHONE HCL 1 MG/ML IJ SOLN
INTRAMUSCULAR | Status: AC
  Filled 2016-06-08: qty 1

## 2016-06-08 MED ORDER — PROPOFOL 10 MG/ML IV BOLUS
INTRAVENOUS | Status: AC
Start: 2016-06-08 — End: 2016-06-08
  Filled 2016-06-08: qty 20

## 2016-06-08 MED ORDER — SODIUM CHLORIDE 0.9 % IJ SOLN
INTRAMUSCULAR | Status: DC | PRN
  Administered 2016-06-08: 30 mL

## 2016-06-08 MED ORDER — TOBRAMYCIN SULFATE 1.2 G IJ SOLR
INTRAMUSCULAR | Status: DC | PRN
  Administered 2016-06-08 (×2): 7.2 g

## 2016-06-08 MED ORDER — FLEET ENEMA 7-19 GM/118ML RE ENEM
1.0000 | ENEMA | Freq: Once | RECTAL | Status: DC | PRN
  Filled 2016-06-08: qty 1

## 2016-06-08 MED ORDER — MORPHINE SULFATE (PF) 2 MG/ML IV SOLN
1.0000 mg | INTRAVENOUS | Status: DC | PRN
  Administered 2016-06-09 – 2016-06-10 (×5): 1 mg via INTRAVENOUS
  Filled 2016-06-08 (×5): qty 1

## 2016-06-08 MED ORDER — DEXTROSE 5 % IV SOLN
2.0000 g | INTRAVENOUS | Status: AC
  Administered 2016-06-08: 2 g via INTRAVENOUS
  Filled 2016-06-08: qty 2

## 2016-06-08 MED ORDER — METOCLOPRAMIDE HCL 5 MG PO TABS: 5.0000 mg | ORAL_TABLET | Freq: Three times a day (TID) | ORAL | Status: DC | PRN

## 2016-06-08 MED ORDER — ACETAMINOPHEN 325 MG PO TABS: 650.0000 mg | ORAL_TABLET | Freq: Four times a day (QID) | ORAL | Status: DC | PRN

## 2016-06-08 MED ORDER — ALBUMIN HUMAN 5 % IV SOLN
INTRAVENOUS | Status: DC | PRN
  Administered 2016-06-08: 17:00:00 via INTRAVENOUS

## 2016-06-08 MED ORDER — ENOXAPARIN SODIUM 40 MG/0.4ML ~~LOC~~ SOLN
40.0000 mg | SUBCUTANEOUS | Status: DC
Start: 2016-06-09 — End: 2016-06-10
  Administered 2016-06-09 – 2016-06-10 (×2): 40 mg via SUBCUTANEOUS
  Filled 2016-06-08 (×2): qty 0.4

## 2016-06-08 MED ORDER — PHENYLEPHRINE HCL 10 MG/ML IJ SOLN
INTRAVENOUS | Status: DC | PRN
  Administered 2016-06-08: 20 ug/min via INTRAVENOUS

## 2016-06-08 MED ORDER — HYDROCODONE-ACETAMINOPHEN 5-325 MG PO TABS
1.0000 | ORAL_TABLET | ORAL | Status: DC | PRN
  Administered 2016-06-09 – 2016-06-11 (×8): 2 via ORAL
  Filled 2016-06-08 (×8): qty 2

## 2016-06-08 MED ORDER — METOCLOPRAMIDE HCL 5 MG/ML IJ SOLN: 5.0000 mg | Freq: Three times a day (TID) | INTRAMUSCULAR | Status: DC | PRN

## 2016-06-08 MED ORDER — 0.9 % SODIUM CHLORIDE (POUR BTL) OPTIME
TOPICAL | Status: DC | PRN
  Administered 2016-06-08: 1000 mL

## 2016-06-08 MED ORDER — CHLORHEXIDINE GLUCONATE CLOTH 2 % EX PADS
6.0000 | MEDICATED_PAD | Freq: Every day | CUTANEOUS | Status: DC
  Administered 2016-06-08: 6 via TOPICAL

## 2016-06-08 MED ORDER — BISACODYL 10 MG RE SUPP
10.0000 mg | Freq: Every day | RECTAL | Status: DC | PRN
  Administered 2016-06-10: 10 mg via RECTAL
  Filled 2016-06-08: qty 1

## 2016-06-08 MED ORDER — SODIUM CHLORIDE 0.9 % IR SOLN
Status: DC | PRN
  Administered 2016-06-08 (×2): 3000 mL
  Administered 2016-06-08: 1000 mL

## 2016-06-08 MED ORDER — FENTANYL CITRATE (PF) 100 MCG/2ML IJ SOLN
INTRAMUSCULAR | Status: DC | PRN
  Administered 2016-06-08 (×2): 50 ug via INTRAVENOUS
  Administered 2016-06-08: 100 ug via INTRAVENOUS
  Administered 2016-06-08 (×2): 50 ug via INTRAVENOUS

## 2016-06-08 MED ORDER — POLYETHYLENE GLYCOL 3350 17 G PO PACK: 17.0000 g | PACK | Freq: Every day | ORAL | Status: DC | PRN

## 2016-06-08 MED ORDER — HYDROMORPHONE HCL 1 MG/ML IJ SOLN
0.2500 mg | INTRAMUSCULAR | Status: DC | PRN
  Administered 2016-06-08 (×2): 0.5 mg via INTRAVENOUS

## 2016-06-08 MED ORDER — FENTANYL CITRATE (PF) 100 MCG/2ML IJ SOLN
INTRAMUSCULAR | Status: AC
  Filled 2016-06-08: qty 2

## 2016-06-08 MED ORDER — METOPROLOL SUCCINATE ER 25 MG PO TB24
37.5000 mg | ORAL_TABLET | Freq: Once | ORAL | Status: AC
  Administered 2016-06-08: 37.5 mg via ORAL
  Filled 2016-06-08 (×2): qty 1

## 2016-06-08 MED ORDER — BUPIVACAINE-EPINEPHRINE (PF) 0.5% -1:200000 IJ SOLN
INTRAMUSCULAR | Status: DC | PRN
  Administered 2016-06-08: 30 mL

## 2016-06-08 MED ORDER — BUPIVACAINE-EPINEPHRINE (PF) 0.5% -1:200000 IJ SOLN
INTRAMUSCULAR | Status: AC
  Filled 2016-06-08: qty 30

## 2016-06-08 MED ORDER — SUGAMMADEX SODIUM 200 MG/2ML IV SOLN
INTRAVENOUS | Status: DC | PRN
  Administered 2016-06-08: 200 mg via INTRAVENOUS

## 2016-06-08 MED ORDER — VANCOMYCIN HCL 1000 MG IV SOLR
INTRAVENOUS | Status: AC
  Filled 2016-06-08: qty 4000

## 2016-06-08 MED ORDER — ONDANSETRON HCL 4 MG/2ML IJ SOLN: 4.0000 mg | Freq: Four times a day (QID) | INTRAMUSCULAR | Status: DC | PRN

## 2016-06-08 MED ORDER — ACETAMINOPHEN 650 MG RE SUPP: 650.0000 mg | Freq: Four times a day (QID) | RECTAL | Status: DC | PRN

## 2016-06-08 MED ORDER — VANCOMYCIN HCL 500 MG IV SOLR
INTRAVENOUS | Status: AC
  Filled 2016-06-08: qty 2000

## 2016-06-08 MED ORDER — ONDANSETRON HCL 4 MG/2ML IJ SOLN
INTRAMUSCULAR | Status: AC
  Filled 2016-06-08: qty 2

## 2016-06-08 MED ORDER — WARFARIN SODIUM 5 MG PO TABS
7.5000 mg | ORAL_TABLET | Freq: Once | ORAL | Status: AC
  Administered 2016-06-08: 7.5 mg via ORAL
  Filled 2016-06-08: qty 2

## 2016-06-08 MED ORDER — ONDANSETRON HCL 4 MG/2ML IJ SOLN: 4.0000 mg | Freq: Once | INTRAMUSCULAR | Status: DC | PRN

## 2016-06-08 MED ORDER — HYDROCODONE-ACETAMINOPHEN 7.5-325 MG PO TABS: 1.0000 | ORAL_TABLET | Freq: Once | ORAL | Status: DC | PRN

## 2016-06-08 MED ORDER — FENTANYL CITRATE (PF) 100 MCG/2ML IJ SOLN
75.0000 ug | Freq: Once | INTRAMUSCULAR | Status: AC
  Administered 2016-06-08: 75 ug via INTRAVENOUS

## 2016-06-08 MED ORDER — TOBRAMYCIN SULFATE 1.2 G IJ SOLR
INTRAMUSCULAR | Status: AC
  Filled 2016-06-08: qty 4.8

## 2016-06-08 MED ORDER — ONDANSETRON HCL 4 MG PO TABS: 4.0000 mg | ORAL_TABLET | Freq: Four times a day (QID) | ORAL | Status: DC | PRN

## 2016-06-08 MED ORDER — PROPOFOL 10 MG/ML IV BOLUS
INTRAVENOUS | Status: DC | PRN
Start: 2016-06-08 — End: 2016-06-08
  Administered 2016-06-08: 60 mg via INTRAVENOUS
  Administered 2016-06-08: 30 mg via INTRAVENOUS
  Administered 2016-06-08: 20 mg via INTRAVENOUS

## 2016-06-08 MED ORDER — MIDAZOLAM HCL 2 MG/2ML IJ SOLN
INTRAMUSCULAR | Status: AC
  Filled 2016-06-08: qty 2

## 2016-06-08 MED ORDER — ARTIFICIAL TEARS OP OINT
TOPICAL_OINTMENT | OPHTHALMIC | Status: DC | PRN
  Administered 2016-06-08: 1 via OPHTHALMIC

## 2016-06-08 MED ORDER — MIDAZOLAM HCL 5 MG/5ML IJ SOLN: INTRAMUSCULAR | Status: DC | PRN

## 2016-06-08 MED ORDER — ROCURONIUM BROMIDE 100 MG/10ML IV SOLN
INTRAVENOUS | Status: DC | PRN
  Administered 2016-06-08: 20 mg via INTRAVENOUS
  Administered 2016-06-08: 10 mg via INTRAVENOUS
  Administered 2016-06-08: 50 mg via INTRAVENOUS

## 2016-06-08 MED ORDER — KETOROLAC TROMETHAMINE 30 MG/ML IJ SOLN
INTRAMUSCULAR | Status: DC | PRN
  Administered 2016-06-08: 30 mg via INTRAMUSCULAR

## 2016-06-08 MED ORDER — CHLORHEXIDINE GLUCONATE 4 % EX LIQD
Freq: Once | CUTANEOUS | Status: AC
  Administered 2016-06-08: 1 via TOPICAL
  Filled 2016-06-08: qty 15

## 2016-06-08 MED ORDER — ONDANSETRON HCL 4 MG/2ML IJ SOLN
INTRAMUSCULAR | Status: DC | PRN
  Administered 2016-06-08: 4 mg via INTRAVENOUS

## 2016-06-08 MED ORDER — BUPIVACAINE-EPINEPHRINE (PF) 0.5% -1:200000 IJ SOLN
INTRAMUSCULAR | Status: DC | PRN
  Administered 2016-06-08: 25 mL via PERINEURAL

## 2016-06-08 MED ORDER — SENNA 8.6 MG PO TABS
1.0000 | ORAL_TABLET | Freq: Two times a day (BID) | ORAL | Status: DC
  Administered 2016-06-08 – 2016-06-10 (×4): 8.6 mg via ORAL
  Filled 2016-06-08 (×5): qty 1

## 2016-06-08 MED ORDER — DOCUSATE SODIUM 100 MG PO CAPS
100.0000 mg | ORAL_CAPSULE | Freq: Two times a day (BID) | ORAL | Status: DC
  Administered 2016-06-08 – 2016-06-10 (×5): 100 mg via ORAL
  Filled 2016-06-08 (×5): qty 1

## 2016-06-08 MED ORDER — WARFARIN - PHARMACIST DOSING INPATIENT
Freq: Every day | Status: DC
  Administered 2016-06-10: 19:00:00

## 2016-06-08 MED ORDER — KETOROLAC TROMETHAMINE 30 MG/ML IJ SOLN
INTRAMUSCULAR | Status: AC
  Filled 2016-06-08: qty 1

## 2016-06-08 MED ORDER — PHENYLEPHRINE HCL 10 MG/ML IJ SOLN
INTRAMUSCULAR | Status: DC | PRN
  Administered 2016-06-08 (×3): 80 ug via INTRAVENOUS

## 2016-06-08 MED ORDER — ETOMIDATE 2 MG/ML IV SOLN
INTRAVENOUS | Status: AC
  Filled 2016-06-08: qty 10

## 2016-06-08 MED ORDER — LIDOCAINE HCL (CARDIAC) 20 MG/ML IV SOLN
INTRAVENOUS | Status: DC | PRN
  Administered 2016-06-08: 100 mg via INTRAVENOUS

## 2016-06-08 MED ORDER — MUPIROCIN 2 % EX OINT
1.0000 "application " | TOPICAL_OINTMENT | Freq: Two times a day (BID) | CUTANEOUS | Status: DC
  Administered 2016-06-08 – 2016-06-11 (×6): 1 via NASAL
  Filled 2016-06-08: qty 22

## 2016-06-08 SURGICAL SUPPLY — 70 items
ALCOHOL ISOPROPYL (RUBBING) (MISCELLANEOUS) ×2 IMPLANT
BANDAGE ACE 6X5 VEL STRL LF (GAUZE/BANDAGES/DRESSINGS) ×2 IMPLANT
BANDAGE ELASTIC 6 VELCRO ST LF (GAUZE/BANDAGES/DRESSINGS) ×2 IMPLANT
BANDAGE ESMARK 6X9 LF (GAUZE/BANDAGES/DRESSINGS) ×1 IMPLANT
BLADE SAG 18X100X1.27 (BLADE) ×2 IMPLANT
BLADE SAW RECIP 87.9 MT (BLADE) ×2 IMPLANT
BLADE SAW SGTL 81X20 HD (BLADE) ×2 IMPLANT
BLADE SAW SGTL NAR THIN XSHT (BLADE) ×2 IMPLANT
BNDG ESMARK 6X9 LF (GAUZE/BANDAGES/DRESSINGS) ×2
BONE CEMENT PALACOS R-G (Orthopedic Implant) ×4 IMPLANT
BONE CEMENT PALACOSE (Orthopedic Implant) ×16 IMPLANT
BOWL SMART MIX CTS (DISPOSABLE) ×2 IMPLANT
CATH THORACIC 36FR (CATHETERS) ×2 IMPLANT
CEMENT BONE PALACOS R-G (Orthopedic Implant) ×2 IMPLANT
CEMENT BONE PALACOSE (Orthopedic Implant) ×8 IMPLANT
CHLORAPREP W/TINT 26ML (MISCELLANEOUS) ×4 IMPLANT
CUFF TOURNIQUET SINGLE 34IN LL (TOURNIQUET CUFF) ×2 IMPLANT
DERMABOND ADVANCED (GAUZE/BANDAGES/DRESSINGS) ×1
DERMABOND ADVANCED .7 DNX12 (GAUZE/BANDAGES/DRESSINGS) ×1 IMPLANT
DRAIN HEMOVAC 7FR (DRAIN) ×2 IMPLANT
DRAPE EXTREMITY T 121X128X90 (DRAPE) ×2 IMPLANT
DRAPE POUCH INSTRU U-SHP 10X18 (DRAPES) ×2 IMPLANT
DRAPE U-SHAPE 47X51 STRL (DRAPES) ×2 IMPLANT
DRSG AQUACEL AG ADV 3.5X14 (GAUZE/BANDAGES/DRESSINGS) ×2 IMPLANT
DRSG TEGADERM 2-3/8X2-3/4 SM (GAUZE/BANDAGES/DRESSINGS) ×2 IMPLANT
DRSG TEGADERM 4X4.75 (GAUZE/BANDAGES/DRESSINGS) ×2 IMPLANT
ELECT BLADE 4.0 EZ CLEAN MEGAD (MISCELLANEOUS) ×2
ELECT REM PT RETURN 9FT ADLT (ELECTROSURGICAL) ×2
ELECTRODE BLDE 4.0 EZ CLN MEGD (MISCELLANEOUS) ×1 IMPLANT
ELECTRODE REM PT RTRN 9FT ADLT (ELECTROSURGICAL) ×1 IMPLANT
EVACUATOR 1/8 PVC DRAIN (DRAIN) IMPLANT
GAUZE SPONGE 4X4 16PLY XRAY LF (GAUZE/BANDAGES/DRESSINGS) ×2 IMPLANT
GLOVE BIO SURGEON STRL SZ8.5 (GLOVE) ×4 IMPLANT
GLOVE BIOGEL PI IND STRL 8.5 (GLOVE) ×1 IMPLANT
GLOVE BIOGEL PI INDICATOR 8.5 (GLOVE) ×1
GLOVE SURG SS PI 6.5 STRL IVOR (GLOVE) ×2 IMPLANT
GOWN STRL REUS W/TWL 2XL LVL3 (GOWN DISPOSABLE) ×2 IMPLANT
HANDPIECE INTERPULSE COAX TIP (DISPOSABLE) ×1
KIT BASIN OR (CUSTOM PROCEDURE TRAY) ×2 IMPLANT
MANIFOLD NEPTUNE II (INSTRUMENTS) ×2 IMPLANT
MARKER SKIN DUAL TIP RULER LAB (MISCELLANEOUS) ×2 IMPLANT
MOLD CEMENT FEMORAL 70MM (DISPOSABLE) ×2 IMPLANT
MOLD CEMENT TIBIAL 75MM (DISPOSABLE) ×2 IMPLANT
NEEDLE SPNL 18GX3.5 QUINCKE PK (NEEDLE) IMPLANT
PACK TOTAL JOINT (CUSTOM PROCEDURE TRAY) ×2 IMPLANT
PADDING CAST COTTON 6X4 STRL (CAST SUPPLIES) ×2 IMPLANT
PIN STEINMANN THREADED TIP (PIN) ×2 IMPLANT
SAW OSC TIP CART 19.5X105X1.3 (SAW) IMPLANT
SEALER BIPOLAR AQUA 6.0 (INSTRUMENTS) ×4 IMPLANT
SET HNDPC FAN SPRY TIP SCT (DISPOSABLE) ×1 IMPLANT
SET PAD KNEE POSITIONER (MISCELLANEOUS) ×2 IMPLANT
SUCTION FRAZIER HANDLE 10FR (MISCELLANEOUS) ×1
SUCTION TUBE FRAZIER 10FR DISP (MISCELLANEOUS) ×1 IMPLANT
SUT ETHILON 2 0 PSLX (SUTURE) ×4 IMPLANT
SUT MNCRL AB 3-0 PS2 27 (SUTURE) ×2 IMPLANT
SUT MON AB 2-0 CT1 36 (SUTURE) ×4 IMPLANT
SUT PDS AB 1 CT  36 (SUTURE) ×3
SUT PDS AB 1 CT 36 (SUTURE) ×3 IMPLANT
SUT VIC AB 1 CTX 27 (SUTURE) ×2 IMPLANT
SUT VIC AB 2-0 CT1 27 (SUTURE) ×1
SUT VIC AB 2-0 CT1 TAPERPNT 27 (SUTURE) ×1 IMPLANT
SUT VLOC 180 0 24IN GS25 (SUTURE) ×2 IMPLANT
SWAB COLLECTION DEVICE MRSA (MISCELLANEOUS) ×2 IMPLANT
SWAB CULTURE ESWAB REG 1ML (MISCELLANEOUS) ×2 IMPLANT
SYR 50ML LL SCALE MARK (SYRINGE) ×4 IMPLANT
TOWEL OR 17X26 10 PK STRL BLUE (TOWEL DISPOSABLE) ×2 IMPLANT
TOWEL OR NON WOVEN STRL DISP B (DISPOSABLE) IMPLANT
TOWER CARTRIDGE SMART MIX (DISPOSABLE) ×8 IMPLANT
WRAP KNEE MAXI GEL POST OP (GAUZE/BANDAGES/DRESSINGS) ×2 IMPLANT
YANKAUER SUCT BULB TIP NO VENT (SUCTIONS) ×2 IMPLANT

## 2016-06-08 NOTE — Transfer of Care (Signed)
Immediate Anesthesia Transfer of Care Note  Patient: Toni Hanna  Procedure(s) Performed: Procedure(s): EXCISIONAL TOTAL KNEE ARTHROPLASTY WITH ANTIBIOTIC SPACERS (Left)  Patient Location: PACU  Anesthesia Type:GA combined with regional for post-op pain  Level of Consciousness: awake, oriented, sedated, patient cooperative and responds to stimulation  Airway & Oxygen Therapy: Patient Spontanous Breathing and Patient connected to nasal cannula oxygen  Post-op Assessment: Report given to RN, Post -op Vital signs reviewed and stable, Patient moving all extremities and Patient moving all extremities X 4  Post vital signs: Reviewed and stable  Last Vitals:  Vitals:   06/08/16 1505 06/08/16 1929  BP: (!) 173/65 (!) 164/68  Pulse: 82   Resp: (!) 25   Temp:  36.1 C    Last Pain:  Vitals:   06/08/16 1929  TempSrc:   PainSc: 0-No pain      Patients Stated Pain Goal: 2 ([id number])  Complications: No apparent anesthesia complications

## 2016-06-08 NOTE — Plan of Care (Signed)
Problem: Tissue Perfusion: Goal: Risk factors for ineffective tissue perfusion will decrease Outcome: Progressing Discussed restarting of blood thinners and VTe with family and patient with some teach back displayed

## 2016-06-08 NOTE — Progress Notes (Signed)
Patient Name: Toni Hanna Date of Encounter: 06/08/2016  Hospital Problem List     Principal Problem:   Pyogenic arthritis of left knee joint ([place]) Active Problems:   Uncontrolled type 2 diabetes mellitus (HCC)   CAD (coronary artery disease)   Hypertension   UTI (urinary tract infection)   Hypophosphatemia   History of breast cancer   Sepsis ([place])   Cellulitis of breast   Group B streptococcal bacteriuria   Group B streptococcal infection   Sepsis due to group B Streptococcus (HCC)   Acute on chronic combined systolic and diastolic congestive heart failure (HCC)   Acute bacterial endocarditis   Prosthetic joint infection, subsequent encounter   Hyperglycemia    Patient Profile     80 year old with history of mitral valve repair, prosthetic left knee replacement, breast cancer, visiting [place] who was admitted with right breast erythema, severe knee pain, chills, fatigue, blood cultures with group B strep.   Cardiology is following for her cardiac issues which include an echocardiogram demonstrating an ejection fraction of 30-35% with mitral valve repair, mild MR, moderate tricuspid regurgitation with moderately elevated pulmonary pressures.  She did have an echocardiogram in June in [place], where she lives, which demonstrated an EF of 50-55%.  TEE done this admission demonstrated EF 40-45%, small oscillating densities on noncoronary and right aortic cuspsn  Subjective   She is not having any acute SOB.  She has redness on her left lower anterior tibial region.   Inpatient Medications    . anastrozole  1 mg Oral Daily  . cefTRIAXone (ROCEPHIN)  IV  2 g Intravenous Q24H  . chlorhexidine  60 mL Topical Once  . Chlorhexidine Gluconate Cloth  6 each Topical Daily  . dronedarone  400 mg Oral BID WC  . furosemide  40 mg Intravenous BID  . gentamicin  60 mg Intravenous Q12H  . Influenza vac split quadrivalent PF  0.5 mL Intramuscular Tomorrow-1000  . insulin aspart   0-5 Units Subcutaneous QHS  . insulin aspart  0-9 Units Subcutaneous TID WC  . insulin glargine  18 Units Subcutaneous QHS  . metoprolol succinate  37.5 mg Oral Daily  . multivitamin  2 tablet Oral Daily  . mupirocin ointment  1 application Nasal BID  . oxybutynin  10 mg Oral QHS  . pantoprazole  40 mg Oral Daily  . povidone-iodine  2 application Topical Once  . saccharomyces boulardii  250 mg Oral BID  . sodium chloride flush  10-40 mL Intracatheter Q12H  . sodium chloride flush  3 mL Intravenous Q12H  . tranexamic acid  1,000 mg Intravenous To OR    Vital Signs    Vitals:   06/08/16 0600 06/08/16 0636 06/08/16 0700 06/08/16 0851  BP: (!) 166/74 (!) 154/83  (!) 147/113  Pulse: 84 84 78 (!) 109  Resp: (!) 25 (!) 23 (!) 21 (!) 23  Temp:    98.8 F (37.1 C)  TempSrc:    Oral  SpO2: 97% 94% 94%   Weight:      Height:        Intake/Output Summary (Last 24 hours) at 06/08/16 0931 Last data filed at 06/08/16 0846  Gross per 24 hour  Intake          4559.86 ml  Output             5275 ml  Net          -715.14 ml   [employer]  06/01/16 1509  Weight: 182 lb (82.6 kg)    Physical Exam    GEN: Well nourished, well developed, in  no acute distress.  Neck: Supple, no JVD, carotid bruits, or masses. Cardiac: RRR, no  rubs, or gallops. No clubbing, cyanosis, moderate/severe edema.  Radials/DP/PT 2+ and equal bilaterally.  Respiratory:  Respirations  regular and unlabored, clear to auscultation bilaterally. GI: Soft, nontender, nondistended, BS + x 4. Neuro:  Strength and sensation are intact.   Labs    CBC  Recent Labs  06/07/16 0600 06/08/16 0637  WBC 6.2 6.9  HGB 9.9* 10.0*  HCT 31.0* 31.1*  MCV 87.3 88.1  PLT 187 [id number]   Basic Metabolic Panel  Recent Labs  06/07/16 0600 06/08/16 0637  NA 137 137  K 3.3* 3.5  CL 102 102  CO2 25 27  GLUCOSE 167* 146*  BUN <5* <5*  CREATININE 0.55 0.57  CALCIUM 8.4* 8.2*  MG 1.3*  --   PHOS 2.6  --    Liver  Function Tests No results for input(s): AST, ALT, ALKPHOS, BILITOT, PROT, ALBUMIN in the last 72 hours. No results for input(s): LIPASE, AMYLASE in the last 72 hours. Cardiac Enzymes No results for input(s): CKTOTAL, CKMB, CKMBINDEX, TROPONINI in the last 72 hours. BNP Invalid input(s): POCBNP D-Dimer No results for input(s): DDIMER in the last 72 hours. Hemoglobin A1C No results for input(s): HGBA1C in the last 72 hours. Fasting Lipid Panel No results for input(s): CHOL, HDL, LDLCALC, TRIG, CHOLHDL, LDLDIRECT in the last 72 hours. Thyroid Function Tests No results for input(s): TSH, T4TOTAL, T3FREE, THYROIDAB in the last 72 hours.  Invalid input(s): FREET3  Telemetry    NSR, sinus tach and atrial tach.   ECG    NA  Radiology    Dg Chest Port 1 View  Result Date: 06/06/2016 CLINICAL DATA:  Wheezing with shortness of breath and chest pain EXAM: PORTABLE CHEST 1 VIEW COMPARISON:  June 04, 2016 FINDINGS: Central catheter tip remains in the superior vena cava. No pneumothorax. There is patchy bibasilar atelectasis, stable. There is a small left pleural effusion. There is cardiomegaly with mild pulmonary venous hypertension. There is a prosthetic valve present. There is atherosclerotic calcification aorta. No adenopathy. IMPRESSION: Pulmonary vascular congestion. No frank edema or consolidation. Small left pleural effusion. Stable patchy bibasilar atelectasis. No new opacity evident. Electronically Signed   By: Lowella Grip III M.D.   On: 06/06/2016 11:48   Dg Chest Port 1 View  Result Date: 06/04/2016 CLINICAL DATA:  Left-sided PICC line insertion EXAM: PORTABLE CHEST 1 VIEW COMPARISON:  06/01/2016 FINDINGS: Left-sided PICC line terminates in the distal SVC. Heart is enlarged. There is atelectasis at each lung base and lingula. Subtle confluent pulmonary opacities in the right lung base cannot exclude a small pneumonic consolidation. Aortic atherosclerosis at its arch. Cardiac  valvular prosthetic with median sternotomy sutures are again noted. Surgical clips project over the right mid hemithorax. IMPRESSION: PICC line tip in the distal SVC from left-sided approach. Bibasilar atelectasis again noted. Possible small focus of right basilar pneumonia. Electronically Signed   By: Ashley Royalty M.D.   On: 06/04/2016 17:13   Dg Chest Port 1 View  Result Date: 06/01/2016 CLINICAL DATA:  Sepsis. EXAM: PORTABLE CHEST 1 VIEW COMPARISON:  None. FINDINGS: Borderline enlarged cardiac silhouette. Median sternotomy wires and prosthetic heart valve. Linear density in both lower lung zones. Surgical clips overlying the right mid lung zone. Unremarkable bones. IMPRESSION: Bilateral lower lung zone linear atelectasis  or scarring. Electronically Signed   By: Claudie Revering M.D.   On: 06/01/2016 19:50   Dg Knee Left Port  Result Date: 06/01/2016 CLINICAL DATA:  Pain and swelling since Saturday.  No known injury. EXAM: PORTABLE LEFT KNEE - 1-2 VIEW COMPARISON:  None. FINDINGS: The left knee demonstrates a total knee arthroplasty without evidence of hardware failure complication. There is a small joint effusion. There is no fracture or dislocation. The alignment is anatomic. IMPRESSION: Left total knee arthroplasty without failure or complication. Electronically Signed   By: Kathreen Devoid   On: 06/01/2016 16:22    Assessment & Plan    ACUTE SYSTOLIC HF:   She seems to have volume overload but mostly with diffuse lower extremity edema.  We will have to follow this very closely after surgery.  For now we will continue IV diuresis.    ATRIAL FIB:  She is currently in sinus and we have chosen to continue Multaq.  Warfarin on hold.  We will follow her rhythm.    ENDOCARDITIS:  We will plan on 6 weeks of IV ceftriaxone and 2 weeks of IV gentamicin with day #1 being Saturday.  Removal of left knee prosthesis today.    Signed, Minus Breeding, MD  06/08/2016, 9:31 AM

## 2016-06-08 NOTE — Progress Notes (Signed)
Orthopedic Tech Progress Note Patient Details:  Toni Hanna 03-20-1936 [id number]  Ortho Devices Type of Ortho Device: Knee Immobilizer Ortho Device/Splint Interventions: Application   [name] 06/08/2016, 11:19 PM

## 2016-06-08 NOTE — Anesthesia Postprocedure Evaluation (Signed)
Anesthesia Post Note  Patient: Toni Hanna  Procedure(s) Performed: Procedure(s) (LRB): EXCISIONAL TOTAL KNEE ARTHROPLASTY WITH ANTIBIOTIC SPACERS (Left)  Patient location during evaluation: PACU Anesthesia Type: General Level of consciousness: awake and alert Pain management: pain level controlled Vital Signs Assessment: post-procedure vital signs reviewed and stable Respiratory status: spontaneous breathing, nonlabored ventilation, respiratory function stable and patient connected to nasal cannula oxygen Cardiovascular status: blood pressure returned to baseline and stable Postop Assessment: no signs of nausea or vomiting Anesthetic complications: no    Last Vitals:  Vitals:   06/08/16 2010 06/08/16 2018  BP: 140/68 138/61  Pulse:  (!) 110  Resp:  19  Temp:  36.1 C    Last Pain:  Vitals:   06/08/16 2018  TempSrc:   PainSc: Asleep    LLE Motor Response: Purposeful movement (06/08/16 2018) LLE Sensation: Full sensation (06/08/16 2018)          Merlyn Conley A

## 2016-06-08 NOTE — Progress Notes (Signed)
PROGRESS NOTE    Toni Hanna  [id number] DOB: 11-Apr-1936 DOA: 06/01/2016 PCP: Toni Bellow, MD   Chief Complaint  Patient presents with  . Knee Pain  . Hyperglycemia     Brief Narrative:  HPI on 06/01/2016 by Toni Hanna a 80 y.o.femalewith medical history significant of breast cancer, history of right breast lumpectomy, CAD, type 2 diabetes, hypertension who is coming to the emergency department due to left knee pain. Per patient, she started having left knee pain on Friday which continuedto get worse through the weekend. She had a left knee arthroplasty in [place] about 10 years ago. She states that she fell on the floor on Sunday evening due to weakness and was unable to get up from the floor, so she decided to go to sleep. This morning, when she woke up, she crawled to the other room to reach her phone, madea phone call for help and was subsequently brought to the emergency department. She estimates that she spent about 12 hours on the floor. She denies fever, but complains of chills, fatigue, generalized weakness, decreased appetite. Toni Hanna from the emergency department and spoke to Toni Hanna(orthopedic surgery) who suggested transfer to [facility] for further evaluation and treatment.   Assessment & Plan   Sepsis secondary to septic arthritis left knee/Strep bacteremia/Endocarditis  -Upon admission, patient had mild leukocytosis, elevated lactic acid, soft BP.  She has been tachycardic and tachypneic.  -Fluid culture GPC in chains, culture strep agalactaie  -Blood cultures from 06/01/2016 show Strep agalactaie in 1/2 bottles -Initially placed on on Aztreonam, vanco  -Repeat Blood cultures from 06/02/2016 show no growth to date -ID consulted and appreciated.  -Echocardiogram: EF 30-35%.  Spoke with hospitalist in [place], who states her EF was 50-55% in June 2017.  -Orthopedics consulted and appreciated- plan for resection arthroplasty,  antibiotic spacer -Patient currently on ceftriaxone (given her PCN allergy- will watch and monitor very closely) -Cardiology consulted and appreciated, TEE showed EF 40-45%, small oscillating densities on noncoronary and right aortic cusps -ID added gentamicin -PICC lined placed  -Orthopedic surgery planning for removal of prosthesis on [id number]  New systolic CHF with mild exacerbation -Echo as above -Cardiology consulted and appreciated -Continue current medications -monitor intake/output, daily weights -patient received IVF upon admission for sepsis -CXR obtained on 06/06/2016 showed pulmonary vascular congestion, no frank edema/consolidation. Small left pleural effusion -patient start on IV lasix -UOP over past 24 hours: 5075cc  Sepsis secondary to right breast cellulitis and ?UTI -Continue current IV antibiotic treatment as above -Urine culture: Ecoli -Erythema of the right breast resolved  Uncontrolled type 2 diabetes mellitus -Hold metformin, glipizide  -hemoglobin A1c 10.4 -Continue Lantus, ISS, CBG monitoring  -Increased Lantus to 18u (from 16)  Hypokalemia/ hypomagnesemia -Continue to replace, continue to monitor   Hypophosphatemia -Resolved with replacement  A Fib on coumadin -CHADSVASC 5 (age, gender, DM, HTN) -Rate controlled -Continue metoprolol -Coumadin held. Continue heparin -INR 1.34 -Given Vit K 5 mg orally (2.5mg  x 2).  Continue to watch INR.  Hypertension -Continue metoprolol  Lactic acidosis -Resolved with IVF   History of breast cancer -Continue Arimidex   Normocytic anemia -Hemoglobin currently 10 -Continue to monitor CBC  DVT Prophylaxis  Heparin  Code Status: Full  Family Communication: None at bedside.    Disposition Plan: Admitted. Pending surgery today 06/08/2016 Patient's family would like patient transferred to [facility]. Patient was accepted in transfer, however, currently no beds available.  Consultants Orthopedics Infectious disease Cardiology  Procedures  PICC line placed Echocardiogram TEE  Antibiotics   Anti-infectives    Start     Dose/Rate Route Frequency Ordered Stop   06/05/16 0600  vancomycin (VANCOCIN) IVPB 1000 mg/200 mL premix     1,000 mg 200 mL/hr over 60 Minutes Intravenous On call to O.R. 06/04/16 1736 06/05/16 0646   06/04/16 2200  gentamicin (GARAMYCIN) IVPB 60 mg     60 mg 100 mL/hr over 30 Minutes Intravenous Every 12 hours 06/04/16 1558     06/03/16 1800  cefTRIAXone (ROCEPHIN) 2 g in dextrose 5 % 50 mL IVPB     2 g 100 mL/hr over 30 Minutes Intravenous Every 24 hours 06/03/16 1613     06/03/16 1230  ceFAZolin (ANCEF) IVPB 2g/100 mL premix  Status:  Discontinued     2 g 200 mL/hr over 30 Minutes Intravenous Every 8 hours 06/03/16 1132 06/03/16 1613   06/02/16 1800  vancomycin (VANCOCIN) 1,250 mg in sodium chloride 0.9 % 250 mL IVPB  Status:  Discontinued     1,250 mg 166.7 mL/hr over 90 Minutes Intravenous Every 24 hours 06/01/16 1910 06/03/16 1132   06/02/16 0300  aztreonam (AZACTAM) 2 g in dextrose 5 % 50 mL IVPB  Status:  Discontinued     2 g 100 mL/hr over 30 Minutes Intravenous Every 8 hours 06/01/16 1910 06/02/16 1711   06/01/16 1915  vancomycin (VANCOCIN) 1,500 mg in sodium chloride 0.9 % 500 mL IVPB     1,500 mg 250 mL/hr over 120 Minutes Intravenous  Once 06/01/16 1901 06/01/16 2249   06/01/16 1900  aztreonam (AZACTAM) 2 g in dextrose 5 % 50 mL IVPB     2 g 100 mL/hr over 30 Minutes Intravenous  Once 06/01/16 1850 06/01/16 2035   06/01/16 1900  vancomycin (VANCOCIN) IVPB 1000 mg/200 mL premix  Status:  Discontinued     1,000 mg 200 mL/hr over 60 Minutes Intravenous  Once 06/01/16 1850 06/01/16 1901      Subjective:   Toni Hanna seen and examined today.  Continues to complain of knee pain.  Looking forward to her surgery today.  Denis further wheezing. Denies chest pain, shortness of breath, abdominal pain, nausea, vomiting,  diarrhea, constipation.    Objective:   Vitals:   06/08/16 0600 06/08/16 0636 06/08/16 0700 06/08/16 0851  BP: (!) 166/74 (!) 154/83  (!) 147/113  Pulse: 84 84 78 (!) 109  Resp: (!) 25 (!) 23 (!) 21 (!) 23  Temp:    98.8 F (37.1 C)  TempSrc:    Oral  SpO2: 97% 94% 94%   Weight:      Height:        Intake/Output Summary (Last 24 hours) at 06/08/16 1003 Last data filed at 06/08/16 0846  Gross per 24 hour  Intake          4559.86 ml  Output             5275 ml  Net          -715.14 ml   Filed Weights   06/01/16 1509  Weight: 82.6 kg (182 lb)    Exam  General: Well developed, well nourished, No distress  HEENT: NCAT,  mucous membranes moist.   Cardiovascular: S1 S2 auscultated, 2/6 SEM, irregular  Respiratory: Mild exp wheezing, diminished breath sounds  Abdomen: Soft, nontender, nondistended, + bowel sounds  Extremities: warm dry without cyanosis clubbing. Left knee/leg swelling, mild pain with ROM. +  LE edema  Neuro: AAOx3, nonfocal  Psych: Appropriate mood and affect, pleasant   Data Reviewed: I have personally reviewed following labs and imaging studies  CBC:  Recent Labs Lab 06/01/16 1551 06/01/16 2306 06/02/16 0504 06/03/16 0212 06/04/16 0338 06/05/16 0500 06/06/16 0416 06/07/16 0600 06/08/16 0637  WBC 11.5* 8.9 7.5 6.1 7.2 6.9 6.9 6.2 6.9  NEUTROABS 9.0* 6.8 5.5 4.5 5.1  --   --   --   --   HGB 14.2 12.7 11.5* 10.8* 10.4* 10.2* 11.0* 9.9* 10.0*  HCT 41.1 37.0 34.8* 32.7* 32.5* 31.5* 34.1* 31.0* 31.1*  MCV 85.3 84.7 85.1 86.3 86.9 86.8 87.2 87.3 88.1  PLT 115* 121* 113* 114* 128* 147* 176 187 [id number]   Basic Metabolic Panel:  Recent Labs Lab 06/01/16 1551  06/02/16 0504 06/03/16 0212 06/04/16 0338 06/05/16 0500 06/06/16 0416 06/07/16 0600 06/08/16 0637  NA 124*  < > 131* 132* 133* 135 135 137 137  K 4.1  < > 3.3* 3.0* 3.3* 3.6 3.5 3.3* 3.5  CL 90*  < > 100* 103 101 100* 103 102 102  CO2 21*  < > 23 22 22 23 24 25 27   GLUCOSE 559*  <  > 141* 212* 199* 212* 248* 167* 146*  BUN 25*  < > 25* 16 8 6 6  <5* <5*  CREATININE 1.35*  < > 0.90 0.67 0.58 0.55 0.57 0.55 0.57  CALCIUM 8.8*  < > 8.0* 7.7* 8.0* 8.5* 8.3* 8.4* 8.2*  MG 1.9  --  1.9 1.6* 1.8  --   --  1.3*  --   PHOS 1.9*  --  1.2* 1.8*  --   --   --  2.6  --   < > = values in this interval not displayed. GFR: Estimated Creatinine Clearance: 59 mL/min (by C-G formula based on SCr of 0.57 mg/dL). Liver Function Tests:  Recent Labs Lab 06/01/16 2306 06/02/16 0504 06/03/16 0212 06/04/16 0338  AST 28 23 17 22   ALT 21 18 17 19   ALKPHOS 63 56 55 79  BILITOT 1.1 1.2 1.3* 0.8  PROT 5.4* 4.7* 4.7* 4.9*  ALBUMIN 2.7* 2.3* 2.1* 2.1*   No results for input(s): LIPASE, AMYLASE in the last 168 hours. No results for input(s): AMMONIA in the last 168 hours. Coagulation Profile:  Recent Labs Lab 06/05/16 0500 06/05/16 1338 06/06/16 0416 06/07/16 0600 06/08/16 0500  INR 1.61 1.50 1.56 1.43 1.34   Cardiac Enzymes:  Recent Labs Lab 06/01/16 1605  CKTOTAL 457*   BNP (last 3 results) No results for input(s): PROBNP in the last 8760 hours. HbA1C: No results for input(s): HGBA1C in the last 72 hours. CBG:  Recent Labs Lab 06/07/16 1046 06/07/16 1316 06/07/16 1731 06/07/16 2150 06/08/16 0855  GLUCAP 152* 147* 162* 142* 134*   Lipid Profile: No results for input(s): CHOL, HDL, LDLCALC, TRIG, CHOLHDL, LDLDIRECT in the last 72 hours. Thyroid Function Tests: No results for input(s): TSH, T4TOTAL, FREET4, T3FREE, THYROIDAB in the last 72 hours. Anemia Panel: No results for input(s): VITAMINB12, FOLATE, FERRITIN, TIBC, IRON, RETICCTPCT in the last 72 hours. Urine analysis:    Component Value Date/Time   COLORURINE YELLOW 06/01/2016 2201   APPEARANCEUR HAZY (A) 06/01/2016 2201   LABSPEC 1.025 06/01/2016 2201   PHURINE 5.5 06/01/2016 2201   GLUCOSEU >1000 (A) 06/01/2016 2201   HGBUR MODERATE (A) 06/01/2016 2201   BILIRUBINUR NEGATIVE 06/01/2016 2201    KETONESUR NEGATIVE 06/01/2016 2201   PROTEINUR 30 (A) 06/01/2016 2201  NITRITE NEGATIVE 06/01/2016 2201   LEUKOCYTESUR SMALL (A) 06/01/2016 2201   Sepsis Labs: @LABRCNTIP (procalcitonin:4,lacticidven:4)  ) Recent Results (from the past 240 hour(s))  Gram stain     Status: None   Collection Time: 06/01/16  4:50 PM  Result Value Ref Range Status   Specimen Description FLUID SYNOVIAL LEFT KNEE  Final   Special Requests NONE  Final   Gram Stain   Final    ABUNDANT WBC PRESENT, PREDOMINANTLY PMN RARE INTRACELLULAR GRAM POSITIVE COCCI Gram Stain Report Called to,Read Back By and Verified With: EDWARDS,C. AT 1804 ON 06/01/2016 BY BAUGHAM,M.    Report Status 06/01/2016 FINAL  Final  Culture, body fluid-bottle     Status: Abnormal   Collection Time: 06/01/16  4:50 PM  Result Value Ref Range Status   Specimen Description FLUID SYNOVIAL LEFT KNEE  Final   Special Requests BOTTLES DRAWN AEROBIC AND ANAEROBIC 3CC EACH  Final   Gram Stain   Final    GRAM POSITIVE COCCI IN CHAINS IN BOTH AEROBIC AND ANAEROBIC BOTTLES Gram Stain Report Called to,Read Back By and Verified With: REAP B AT [employer] AT 0600 ON [id number] BY FORSYTH K Performed at [place] B STREP(S.AGALACTIAE)ISOLATED (A)  Final   Report Status 06/04/2016 FINAL  Final   Organism ID, Bacteria GROUP B STREP(S.AGALACTIAE)ISOLATED  Final      Susceptibility   Group b strep(s.agalactiae)isolated - MIC*    CLINDAMYCIN >=1 RESISTANT Resistant     AMPICILLIN <=0.25 SENSITIVE Sensitive     ERYTHROMYCIN >=8 RESISTANT Resistant     VANCOMYCIN 0.5 SENSITIVE Sensitive     CEFTRIAXONE <=0.12 SENSITIVE Sensitive     LEVOFLOXACIN 1 SENSITIVE Sensitive     * GROUP B STREP(S.AGALACTIAE)ISOLATED  Blood Culture (routine x 2)     Status: Abnormal   Collection Time: 06/01/16  7:42 PM  Result Value Ref Range Status   Specimen Description BLOOD LEFT HAND  Final   Special Requests BOTTLES DRAWN AEROBIC ONLY 6CC  Final   Culture   Setup Time   Final    GRAM POSITIVE COCCI IN CHAINS RECOVERED FROM THE AEROBIC BOTTLE Gram Stain Report Called to,Read Back By and Verified With: AGUIRRE,A. AT [id number] ON 06/02/2016 BY BAUGHAM,M. Performed at [place] ID to follow CRITICAL RESULT CALLED TO, READ BACK BY AND VERIFIED WITH: G ABBOTT PHARMD 2300 06/02/16 A BROWNING Performed at [place] B STREP(S.AGALACTIAE)ISOLATED (A)  Final   Report Status 06/04/2016 FINAL  Final   Organism ID, Bacteria GROUP B STREP(S.AGALACTIAE)ISOLATED  Final      Susceptibility   Group b strep(s.agalactiae)isolated - MIC*    CLINDAMYCIN >=1 RESISTANT Resistant     AMPICILLIN <=0.25 SENSITIVE Sensitive     ERYTHROMYCIN >=8 RESISTANT Resistant     VANCOMYCIN 0.5 SENSITIVE Sensitive     CEFTRIAXONE <=0.12 SENSITIVE Sensitive     LEVOFLOXACIN 1 SENSITIVE Sensitive     * GROUP B STREP(S.AGALACTIAE)ISOLATED  Blood Culture ID Panel (Reflexed)     Status: Abnormal   Collection Time: 06/01/16  7:42 PM  Result Value Ref Range Status   Enterococcus species NOT DETECTED NOT DETECTED Final   Vancomycin resistance NOT DETECTED NOT DETECTED Final   Listeria monocytogenes NOT DETECTED NOT DETECTED Final   Staphylococcus species NOT DETECTED NOT DETECTED Final   Staphylococcus aureus NOT DETECTED NOT DETECTED Final   Methicillin resistance NOT DETECTED NOT DETECTED Final   Streptococcus species DETECTED (A)  NOT DETECTED Final    Comment: CRITICAL RESULT CALLED TO, READ BACK BY AND VERIFIED WITH: G ABBOTT PHARMD 2300 06/02/16 A BROWNING    Streptococcus agalactiae DETECTED (A) NOT DETECTED Final    Comment: CRITICAL RESULT CALLED TO, READ BACK BY AND VERIFIED WITH: G ABBOTT PHARMD 2300 06/02/16 A BROWNING    Streptococcus pneumoniae NOT DETECTED NOT DETECTED Final   Streptococcus pyogenes NOT DETECTED NOT DETECTED Final   Acinetobacter baumannii NOT DETECTED NOT DETECTED Final   Enterobacteriaceae species NOT  DETECTED NOT DETECTED Final   Enterobacter cloacae complex NOT DETECTED NOT DETECTED Final   Escherichia coli NOT DETECTED NOT DETECTED Final   Klebsiella oxytoca NOT DETECTED NOT DETECTED Final   Klebsiella pneumoniae NOT DETECTED NOT DETECTED Final   Proteus species NOT DETECTED NOT DETECTED Final   Serratia marcescens NOT DETECTED NOT DETECTED Final   Carbapenem resistance NOT DETECTED NOT DETECTED Final   Haemophilus influenzae NOT DETECTED NOT DETECTED Final   Neisseria meningitidis NOT DETECTED NOT DETECTED Final   Pseudomonas aeruginosa NOT DETECTED NOT DETECTED Final   Candida albicans NOT DETECTED NOT DETECTED Final   Candida glabrata NOT DETECTED NOT DETECTED Final   Candida krusei NOT DETECTED NOT DETECTED Final   Candida parapsilosis NOT DETECTED NOT DETECTED Final   Candida tropicalis NOT DETECTED NOT DETECTED Final    Comment: Performed at [facility]  Blood Culture (routine x 2)     Status: None   Collection Time: 06/01/16  7:54 PM  Result Value Ref Range Status   Specimen Description BLOOD LEFT HAND  Final   Special Requests BOTTLES DRAWN AEROBIC ONLY [place]  Final   Culture NO GROWTH 5 DAYS  Final   Report Status 06/06/2016 FINAL  Final  Urine culture     Status: Abnormal   Collection Time: 06/01/16 10:01 PM  Result Value Ref Range Status   Specimen Description URINE, CLEAN CATCH  Final   Special Requests NONE  Final   Culture >=100,000 COLONIES/mL ESCHERICHIA COLI (A)  Final   Report Status 06/04/2016 FINAL  Final   Organism ID, Bacteria ESCHERICHIA COLI (A)  Final      Susceptibility   Escherichia coli - MIC*    AMPICILLIN >=32 RESISTANT Resistant     CEFAZOLIN 32 INTERMEDIATE Intermediate     CEFTRIAXONE <=1 SENSITIVE Sensitive     CIPROFLOXACIN <=0.25 SENSITIVE Sensitive     GENTAMICIN <=1 SENSITIVE Sensitive     IMIPENEM <=0.25 SENSITIVE Sensitive     NITROFURANTOIN <=16 SENSITIVE Sensitive     TRIMETH/SULFA <=20 SENSITIVE Sensitive      AMPICILLIN/SULBACTAM >=32 RESISTANT Resistant     PIP/TAZO 8 SENSITIVE Sensitive     Extended ESBL NEGATIVE Sensitive     * >=100,000 COLONIES/mL ESCHERICHIA COLI  MRSA PCR Screening     Status: None   Collection Time: 06/02/16  2:50 AM  Result Value Ref Range Status   MRSA by PCR NEGATIVE NEGATIVE Final    Comment:        The GeneXpert MRSA Assay (FDA approved for NASAL specimens only), is one component of a comprehensive MRSA colonization surveillance program. It is not intended to diagnose MRSA infection nor to guide or monitor treatment for MRSA infections.   Culture, blood (Routine X 2) w Reflex to ID Panel     Status: None   Collection Time: 06/02/16  6:55 PM  Result Value Ref Range Status   Specimen Description BLOOD LEFT HAND  Final   Special  Requests BOTTLES DRAWN AEROBIC ONLY 5CC  Final   Culture NO GROWTH 5 DAYS  Final   Report Status 06/07/2016 FINAL  Final  Culture, blood (Routine X 2) w Reflex to ID Panel     Status: None   Collection Time: 06/02/16  7:05 PM  Result Value Ref Range Status   Specimen Description BLOOD LEFT ANTECUBITAL  Final   Special Requests BOTTLES DRAWN AEROBIC AND ANAEROBIC 10 CC EA  Final   Culture NO GROWTH 5 DAYS  Final   Report Status 06/07/2016 FINAL  Final  Surgical PCR screen     Status: Abnormal   Collection Time: 06/08/16 12:34 AM  Result Value Ref Range Status   MRSA, PCR NEGATIVE NEGATIVE Final   Staphylococcus aureus POSITIVE (A) NEGATIVE Final    Comment:        The Xpert SA Assay (FDA approved for NASAL specimens in patients over 50 years of age), is one component of a comprehensive surveillance program.  Test performance has been validated by [facility] for patients greater than or equal to 27 year old. It is not intended to diagnose infection nor to guide or monitor treatment.       Radiology Studies: Dg Chest Port 1 View  Result Date: 06/06/2016 CLINICAL DATA:  Wheezing with shortness of breath and chest  pain EXAM: PORTABLE CHEST 1 VIEW COMPARISON:  June 04, 2016 FINDINGS: Central catheter tip remains in the superior vena cava. No pneumothorax. There is patchy bibasilar atelectasis, stable. There is a small left pleural effusion. There is cardiomegaly with mild pulmonary venous hypertension. There is a prosthetic valve present. There is atherosclerotic calcification aorta. No adenopathy. IMPRESSION: Pulmonary vascular congestion. No frank edema or consolidation. Small left pleural effusion. Stable patchy bibasilar atelectasis. No new opacity evident. Electronically Signed   By: Lowella Grip III M.D.   On: 06/06/2016 11:48     Scheduled Meds: . anastrozole  1 mg Oral Daily  . cefTRIAXone (ROCEPHIN)  IV  2 g Intravenous Q24H  . chlorhexidine  60 mL Topical Once  . Chlorhexidine Gluconate Cloth  6 each Topical Daily  . dronedarone  400 mg Oral BID WC  . furosemide  40 mg Intravenous BID  . gentamicin  60 mg Intravenous Q12H  . Influenza vac split quadrivalent PF  0.5 mL Intramuscular Tomorrow-1000  . insulin aspart  0-5 Units Subcutaneous QHS  . insulin aspart  0-9 Units Subcutaneous TID WC  . insulin glargine  18 Units Subcutaneous QHS  . metoprolol succinate  37.5 mg Oral Daily  . multivitamin  2 tablet Oral Daily  . mupirocin ointment  1 application Nasal BID  . oxybutynin  10 mg Oral QHS  . pantoprazole  40 mg Oral Daily  . povidone-iodine  2 application Topical Once  . saccharomyces boulardii  250 mg Oral BID  . sodium chloride flush  10-40 mL Intracatheter Q12H  . sodium chloride flush  3 mL Intravenous Q12H  . tranexamic acid  1,000 mg Intravenous To OR   Continuous Infusions: . sodium chloride 125 mL/hr at 06/08/16 0231     LOS: 7 days   Time Spent in minutes   30 minutes  Ketrina Boateng D.O. on 06/08/2016 at 10:03 AM  Between 7am to 7pm - Pager - [phone]  After 7pm go to [email or web address] - password [email or web address]  And look for the night coverage person covering for me  after hours  [facility] Office  [phone]

## 2016-06-08 NOTE — Progress Notes (Signed)
ANTICOAGULATION CONSULT NOTE - Follow Up Consult  Pharmacy Consult:  Heparin Indication: atrial fibrillation   Patient Measurements: Height: 5\' 3"  (160 cm) Weight: 182 lb (82.6 kg) IBW/kg (Calculated) : 52.4 Heparin Dosing Weight: 71 kg  Vital Signs: Temp: 97 F (36.1 C) (10/09 2018) Temp Source: Oral (10/09 0851) BP: 138/61 (10/09 2018) Pulse Rate: 110 (10/09 2018)  Labs:  Recent Labs  06/06/16 0416 06/06/16 1400 06/07/16 0015 06/07/16 0600 06/07/16 0920 06/08/16 0500 06/08/16 0637  HGB 11.0*  --   --  9.9*  --   --  10.0*  HCT 34.1*  --   --  31.0*  --   --  31.1*  PLT 176  --   --  187  --   --  190  LABPROT 18.8*  --   --  17.6*  --  16.7*  --   INR 1.56  --   --  1.43  --  1.34  --   HEPARINUNFRC  --  1.60* <0.10*  --  0.32  --   --   CREATININE 0.57  --   --  0.55  --   --  0.57    Estimated Creatinine Clearance: 59 mL/min (by C-G formula based on SCr of 0.57 mg/dL).   Assessment: 59 YOF with history of afib (CHADsVASc = 5) and MVrepair, on coumadin PTA. She has been on heparin and now s/p TKA and antibiotic spacers on 10/9  (CHADsVASc = 5) and 2003 with MVrepair, Maze procedure.  INR on admit was 3.15 and s/p vitamin K x2 doses. Pharmacy consulted to dose coumadin  -INR= 1.34  Home coumadin dose: 5mg /day  Goal of Therapy:  Heparin level 0.3-0.7 units/ml Monitor platelets by anticoagulation protocol: Yes  Plan:  -Coumadin 7.5mg  po today -Daily INR -Will follow plans for heparin if stable post OR  Hildred Laser, Pharm D 06/08/2016 8:54 PM

## 2016-06-08 NOTE — Anesthesia Procedure Notes (Signed)
Anesthesia Regional Block:  Adductor canal block  Pre-Anesthetic Checklist: ,, timeout performed, Correct Patient, Correct Site, Correct Laterality, Correct Procedure, Correct Position, site marked, Risks and benefits discussed,  Surgical consent,  Pre-op evaluation,  At surgeon's request and post-op pain management  Laterality: Left  Prep: chloraprep       Needles:  Injection technique: Single-shot  Needle Type: Stimiplex     Needle Length: 5cm 5 cm Needle Gauge: 21 G    Additional Needles:  Procedures: ultrasound guided (picture in chart) Adductor canal block Narrative:  Injection made incrementally with aspirations every 5 mL.  Performed by: Personally  Anesthesiologist: Natallia Stellmach  Additional Notes: Risks, benefits and alternative to block explained extensively.  Patient tolerated procedure well, without complications.

## 2016-06-08 NOTE — Interval H&P Note (Signed)
History and Physical Interval Note:  06/08/2016 3:36 PM  Toni Hanna  has presented today for surgery, with the diagnosis of infected total knee  The various methods of treatment have been discussed with the patient and family. After consideration of risks, benefits and other options for treatment, the patient has consented to  Procedure(s): EXCISIONAL TOTAL KNEE ARTHROPLASTY WITH ANTIBIOTIC SPACERS (Left) as a surgical intervention .  The patient's history has been reviewed, patient examined, no change in status, stable for surgery.  I have reviewed the patient's chart and labs.  Questions were answered to the patient's satisfaction.     Derris Millan, Horald Pollen

## 2016-06-08 NOTE — Progress Notes (Signed)
ANTICOAGULATION CONSULT NOTE - Follow Up Consult  Pharmacy Consult:  Heparin Indication: atrial fibrillation   Patient Measurements: Height: 5\' 3"  (160 cm) Weight: 182 lb (82.6 kg) IBW/kg (Calculated) : 52.4 Heparin Dosing Weight: 71 kg  Vital Signs: Temp: 99.3 F (37.4 C) (10/09 0231) Temp Source: Oral (10/09 0231) BP: 154/83 (10/09 0636) Pulse Rate: 78 (10/09 0700)  Labs:  Recent Labs  06/06/16 0416 06/06/16 1400 06/07/16 0015 06/07/16 0600 06/07/16 0920 06/08/16 0500 06/08/16 0637  HGB 11.0*  --   --  9.9*  --   --  10.0*  HCT 34.1*  --   --  31.0*  --   --  31.1*  PLT 176  --   --  187  --   --  190  LABPROT 18.8*  --   --  17.6*  --  16.7*  --   INR 1.56  --   --  1.43  --  1.34  --   HEPARINUNFRC  --  1.60* <0.10*  --  0.32  --   --   CREATININE 0.57  --   --  0.55  --   --  0.57    Estimated Creatinine Clearance: 59 mL/min (by C-G formula based on SCr of 0.57 mg/dL).   Assessment: 40 YOF with on heparin bridge while Coumadin reversed for OR on 10/9. Coumadin 5mg  daily PTA. INR on admit was 3.15. Hx PAF (CHADsVASc = 5) and 2003 with MVrepair, Maze procedure.  S/p Vit K 2.5mg  PO x2 doses, INR down to 1.34 today. HL yesterday was therapeutic at 0.32. Hgb stable at 10.0, plts wnl. No s/s of bleed.  Has been difficult to assess heparin therapy because heparin is infusing through the central line and her upper extremities have limited access to obtain labs.  Goal of Therapy:  Heparin level 0.3-0.7 units/ml Monitor platelets by anticoagulation protocol: Yes  Plan:  Hold heparin gtt F/U s/p OR for heparin / coumadin restart  Elenor Quinones, PharmD, [facility] Clinical Pharmacist Pager [phone] 06/08/2016 7:50 AM

## 2016-06-08 NOTE — Plan of Care (Signed)
Problem: Nutrition: Goal: Adequate nutrition will be maintained Outcome: Progressing Discussed with patient and family plan of care for the evening - lab values, nothing by mouth status after midnight and surgery procedure times with teach back displayed.

## 2016-06-08 NOTE — Progress Notes (Signed)
Subjective:  small amt of erytema LLE   Antibiotics:  Anti-infectives    Start     Dose/Rate Route Frequency Ordered Stop   06/05/16 0600  vancomycin (VANCOCIN) IVPB 1000 mg/200 mL premix     1,000 mg 200 mL/hr over 60 Minutes Intravenous On call to O.R. 06/04/16 1736 06/05/16 0646   06/04/16 2200  gentamicin (GARAMYCIN) IVPB 60 mg     60 mg 100 mL/hr over 30 Minutes Intravenous Every 12 hours 06/04/16 1558     06/03/16 1800  cefTRIAXone (ROCEPHIN) 2 g in dextrose 5 % 50 mL IVPB     2 g 100 mL/hr over 30 Minutes Intravenous Every 24 hours 06/03/16 1613     06/03/16 1230  ceFAZolin (ANCEF) IVPB 2g/100 mL premix  Status:  Discontinued     2 g 200 mL/hr over 30 Minutes Intravenous Every 8 hours 06/03/16 1132 06/03/16 1613   06/02/16 1800  vancomycin (VANCOCIN) 1,250 mg in sodium chloride 0.9 % 250 mL IVPB  Status:  Discontinued     1,250 mg 166.7 mL/hr over 90 Minutes Intravenous Every 24 hours 06/01/16 1910 06/03/16 1132   06/02/16 0300  aztreonam (AZACTAM) 2 g in dextrose 5 % 50 mL IVPB  Status:  Discontinued     2 g 100 mL/hr over 30 Minutes Intravenous Every 8 hours 06/01/16 1910 06/02/16 1711   06/01/16 1915  vancomycin (VANCOCIN) 1,500 mg in sodium chloride 0.9 % 500 mL IVPB     1,500 mg 250 mL/hr over 120 Minutes Intravenous  Once 06/01/16 1901 06/01/16 2249   06/01/16 1900  aztreonam (AZACTAM) 2 g in dextrose 5 % 50 mL IVPB     2 g 100 mL/hr over 30 Minutes Intravenous  Once 06/01/16 1850 06/01/16 2035   06/01/16 1900  vancomycin (VANCOCIN) IVPB 1000 mg/200 mL premix  Status:  Discontinued     1,000 mg 200 mL/hr over 60 Minutes Intravenous  Once 06/01/16 1850 06/01/16 1901      Medications: Scheduled Meds: . anastrozole  1 mg Oral Daily  . cefTRIAXone (ROCEPHIN)  IV  2 g Intravenous Q24H  . chlorhexidine  60 mL Topical Once  . Chlorhexidine Gluconate Cloth  6 each Topical Daily  . dronedarone  400 mg Oral BID WC  . furosemide  40 mg Intravenous BID  .  gentamicin  60 mg Intravenous Q12H  . Influenza vac split quadrivalent PF  0.5 mL Intramuscular Tomorrow-1000  . insulin aspart  0-5 Units Subcutaneous QHS  . insulin aspart  0-9 Units Subcutaneous TID WC  . insulin glargine  18 Units Subcutaneous QHS  . metoprolol succinate  37.5 mg Oral Daily  . multivitamin  2 tablet Oral Daily  . mupirocin ointment  1 application Nasal BID  . oxybutynin  10 mg Oral QHS  . pantoprazole  40 mg Oral Daily  . povidone-iodine  2 application Topical Once  . saccharomyces boulardii  250 mg Oral BID  . sodium chloride flush  10-40 mL Intracatheter Q12H  . sodium chloride flush  3 mL Intravenous Q12H  . tranexamic acid  1,000 mg Intravenous To OR   Continuous Infusions: . sodium chloride 125 mL/hr at 06/08/16 0231   PRN Meds:.acetaminophen, dextrose, HYDROcodone-acetaminophen, loperamide, morphine injection, ondansetron (ZOFRAN) IV, sodium chloride flush, zolpidem    Objective: Weight change:   Intake/Output Summary (Last 24 hours) at 06/08/16 1136 Last data filed at 06/08/16 1035  Gross per 24 hour  Intake  4319.86 ml  Output             6625 ml  Net         -2305.14 ml   Blood pressure (!) 147/113, pulse (!) 109, temperature 98.8 F (37.1 C), temperature source Oral, resp. rate (!) 23, height 5\' 3"  (1.6 m), weight 182 lb (82.6 kg), SpO2 94 %. Temp:  [97.9 F (36.6 C)-99.3 F (37.4 C)] 98.8 F (37.1 C) (10/09 0851) Pulse Rate:  [78-109] 109 (10/09 0851) Resp:  [0-30] 23 (10/09 0851) BP: (125-166)/(55-113) 147/113 (10/09 0851) SpO2:  [90 %-97 %] 94 % (10/09 0700)  Physical Exam: General: Alert and awake, oriented x3, not in any acute distress. General: Alert and awake, oriented x3, not in any acute distress. HEENT: anicteric sclera,  EOMI, oropharynx clear and without exudate Cardiovascular: regular rate, normal r,  no murmur rubs or gallops Pulmonary: clear to auscultation bilaterally, no wheezing, rales or  rhonchi Gastrointestinal: soft nontender, nondistended, normal bowel sounds, Musculoskeletal: edema warmth left knee, left anterior shin with slightly tender erythematous area not fluctuant Neuro: nonfocal  CBC:  CBC Latest Ref Rng & Units 06/08/2016 06/07/2016 06/06/2016  WBC 4.0 - 10.5 K/uL 6.9 6.2 6.9  Hemoglobin 12.0 - 15.0 g/dL 10.0(L) 9.9(L) 11.0(L)  Hematocrit 36.0 - 46.0 % 31.1(L) 31.0(L) 34.1(L)  Platelets 150 - 400 K/uL 190 187 176      BMET  Recent Labs  06/07/16 0600 06/08/16 0637  NA 137 137  K 3.3* 3.5  CL 102 102  CO2 25 27  GLUCOSE 167* 146*  BUN <5* <5*  CREATININE 0.55 0.57  CALCIUM 8.4* 8.2*     Liver Panel  No results for input(s): PROT, ALBUMIN, AST, ALT, ALKPHOS, BILITOT, BILIDIR, IBILI in the last 72 hours.     Sedimentation Rate No results for input(s): ESRSEDRATE in the last 72 hours. C-Reactive Protein No results for input(s): CRP in the last 72 hours.  Micro Results: Recent Results (from the past 720 hour(s))  Gram stain     Status: None   Collection Time: 06/01/16  4:50 PM  Result Value Ref Range Status   Specimen Description FLUID SYNOVIAL LEFT KNEE  Final   Special Requests NONE  Final   Gram Stain   Final    ABUNDANT WBC PRESENT, PREDOMINANTLY PMN RARE INTRACELLULAR GRAM POSITIVE COCCI Gram Stain Report Called to,Read Back By and Verified With: EDWARDS,C. AT 1804 ON 06/01/2016 BY BAUGHAM,M.    Report Status 06/01/2016 FINAL  Final  Culture, body fluid-bottle     Status: Abnormal   Collection Time: 06/01/16  4:50 PM  Result Value Ref Range Status   Specimen Description FLUID SYNOVIAL LEFT KNEE  Final   Special Requests BOTTLES DRAWN AEROBIC AND ANAEROBIC 3CC EACH  Final   Gram Stain   Final    GRAM POSITIVE COCCI IN CHAINS IN BOTH AEROBIC AND ANAEROBIC BOTTLES Gram Stain Report Called to,Read Back By and Verified With: REAP B AT [place] AT 0600 ON [id number] BY FORSYTH K Performed at [place] B  STREP(S.AGALACTIAE)ISOLATED (A)  Final   Report Status 06/04/2016 FINAL  Final   Organism ID, Bacteria GROUP B STREP(S.AGALACTIAE)ISOLATED  Final      Susceptibility   Group b strep(s.agalactiae)isolated - MIC*    CLINDAMYCIN >=1 RESISTANT Resistant     AMPICILLIN <=0.25 SENSITIVE Sensitive     ERYTHROMYCIN >=8 RESISTANT Resistant     VANCOMYCIN 0.5 SENSITIVE Sensitive  CEFTRIAXONE <=0.12 SENSITIVE Sensitive     LEVOFLOXACIN 1 SENSITIVE Sensitive     * GROUP B STREP(S.AGALACTIAE)ISOLATED  Blood Culture (routine x 2)     Status: Abnormal   Collection Time: 06/01/16  7:42 PM  Result Value Ref Range Status   Specimen Description BLOOD LEFT HAND  Final   Special Requests BOTTLES DRAWN AEROBIC ONLY [place]  Final   Culture  Setup Time   Final    GRAM POSITIVE COCCI IN CHAINS RECOVERED FROM THE AEROBIC BOTTLE Gram Stain Report Called to,Read Back By and Verified With: AGUIRRE,A. AT [id number] ON 06/02/2016 BY BAUGHAM,M. Performed at [place] ID to follow CRITICAL RESULT CALLED TO, READ BACK BY AND VERIFIED WITH: G ABBOTT PHARMD 2300 06/02/16 A BROWNING Performed at [place] B STREP(S.AGALACTIAE)ISOLATED (A)  Final   Report Status 06/04/2016 FINAL  Final   Organism ID, Bacteria GROUP B STREP(S.AGALACTIAE)ISOLATED  Final      Susceptibility   Group b strep(s.agalactiae)isolated - MIC*    CLINDAMYCIN >=1 RESISTANT Resistant     AMPICILLIN <=0.25 SENSITIVE Sensitive     ERYTHROMYCIN >=8 RESISTANT Resistant     VANCOMYCIN 0.5 SENSITIVE Sensitive     CEFTRIAXONE <=0.12 SENSITIVE Sensitive     LEVOFLOXACIN 1 SENSITIVE Sensitive     * GROUP B STREP(S.AGALACTIAE)ISOLATED  Blood Culture ID Panel (Reflexed)     Status: Abnormal   Collection Time: 06/01/16  7:42 PM  Result Value Ref Range Status   Enterococcus species NOT DETECTED NOT DETECTED Final   Vancomycin resistance NOT DETECTED NOT DETECTED Final   Listeria monocytogenes NOT DETECTED NOT  DETECTED Final   Staphylococcus species NOT DETECTED NOT DETECTED Final   Staphylococcus aureus NOT DETECTED NOT DETECTED Final   Methicillin resistance NOT DETECTED NOT DETECTED Final   Streptococcus species DETECTED (A) NOT DETECTED Final    Comment: CRITICAL RESULT CALLED TO, READ BACK BY AND VERIFIED WITH: G ABBOTT PHARMD 2300 06/02/16 A BROWNING    Streptococcus agalactiae DETECTED (A) NOT DETECTED Final    Comment: CRITICAL RESULT CALLED TO, READ BACK BY AND VERIFIED WITH: G ABBOTT PHARMD 2300 06/02/16 A BROWNING    Streptococcus pneumoniae NOT DETECTED NOT DETECTED Final   Streptococcus pyogenes NOT DETECTED NOT DETECTED Final   Acinetobacter baumannii NOT DETECTED NOT DETECTED Final   Enterobacteriaceae species NOT DETECTED NOT DETECTED Final   Enterobacter cloacae complex NOT DETECTED NOT DETECTED Final   Escherichia coli NOT DETECTED NOT DETECTED Final   Klebsiella oxytoca NOT DETECTED NOT DETECTED Final   Klebsiella pneumoniae NOT DETECTED NOT DETECTED Final   Proteus species NOT DETECTED NOT DETECTED Final   Serratia marcescens NOT DETECTED NOT DETECTED Final   Carbapenem resistance NOT DETECTED NOT DETECTED Final   Haemophilus influenzae NOT DETECTED NOT DETECTED Final   Neisseria meningitidis NOT DETECTED NOT DETECTED Final   Pseudomonas aeruginosa NOT DETECTED NOT DETECTED Final   Candida albicans NOT DETECTED NOT DETECTED Final   Candida glabrata NOT DETECTED NOT DETECTED Final   Candida krusei NOT DETECTED NOT DETECTED Final   Candida parapsilosis NOT DETECTED NOT DETECTED Final   Candida tropicalis NOT DETECTED NOT DETECTED Final    Comment: Performed at [facility]  Blood Culture (routine x 2)     Status: None   Collection Time: 06/01/16  7:54 PM  Result Value Ref Range Status   Specimen Description BLOOD LEFT HAND  Final   Special Requests BOTTLES DRAWN AEROBIC ONLY 6CC  Final   Culture NO GROWTH 5 DAYS  Final   Report Status 06/06/2016 FINAL  Final    Urine culture     Status: Abnormal   Collection Time: 06/01/16 10:01 PM  Result Value Ref Range Status   Specimen Description URINE, CLEAN CATCH  Final   Special Requests NONE  Final   Culture >=100,000 COLONIES/mL ESCHERICHIA COLI (A)  Final   Report Status 06/04/2016 FINAL  Final   Organism ID, Bacteria ESCHERICHIA COLI (A)  Final      Susceptibility   Escherichia coli - MIC*    AMPICILLIN >=32 RESISTANT Resistant     CEFAZOLIN 32 INTERMEDIATE Intermediate     CEFTRIAXONE <=1 SENSITIVE Sensitive     CIPROFLOXACIN <=0.25 SENSITIVE Sensitive     GENTAMICIN <=1 SENSITIVE Sensitive     IMIPENEM <=0.25 SENSITIVE Sensitive     NITROFURANTOIN <=16 SENSITIVE Sensitive     TRIMETH/SULFA <=20 SENSITIVE Sensitive     AMPICILLIN/SULBACTAM >=32 RESISTANT Resistant     PIP/TAZO 8 SENSITIVE Sensitive     Extended ESBL NEGATIVE Sensitive     * >=100,000 COLONIES/mL ESCHERICHIA COLI  MRSA PCR Screening     Status: None   Collection Time: 06/02/16  2:50 AM  Result Value Ref Range Status   MRSA by PCR NEGATIVE NEGATIVE Final    Comment:        The GeneXpert MRSA Assay (FDA approved for NASAL specimens only), is one component of a comprehensive MRSA colonization surveillance program. It is not intended to diagnose MRSA infection nor to guide or monitor treatment for MRSA infections.   Culture, blood (Routine X 2) w Reflex to ID Panel     Status: None   Collection Time: 06/02/16  6:55 PM  Result Value Ref Range Status   Specimen Description BLOOD LEFT HAND  Final   Special Requests BOTTLES DRAWN AEROBIC ONLY 5CC  Final   Culture NO GROWTH 5 DAYS  Final   Report Status 06/07/2016 FINAL  Final  Culture, blood (Routine X 2) w Reflex to ID Panel     Status: None   Collection Time: 06/02/16  7:05 PM  Result Value Ref Range Status   Specimen Description BLOOD LEFT ANTECUBITAL  Final   Special Requests BOTTLES DRAWN AEROBIC AND ANAEROBIC 10 CC EA  Final   Culture NO GROWTH 5 DAYS  Final    Report Status 06/07/2016 FINAL  Final  Surgical PCR screen     Status: Abnormal   Collection Time: 06/08/16 12:34 AM  Result Value Ref Range Status   MRSA, PCR NEGATIVE NEGATIVE Final   Staphylococcus aureus POSITIVE (A) NEGATIVE Final    Comment:        The Xpert SA Assay (FDA approved for NASAL specimens in patients over 24 years of age), is one component of a comprehensive surveillance program.  Test performance has been validated by [facility] for patients greater than or equal to 109 year old. It is not intended to diagnose infection nor to guide or monitor treatment.     Studies/Results: No results found.    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 Marlou Porch, Dr Stanford Breed and Dr Noralyn Pick critical help with this patient   REMOVAL OF PROSTHESIS with I and D is planned in the OR  today  We will plan on 6 weeks of IV ceftriaxone and 2 weeks of IV gentamicin with day #1 being Saturday  She will need her GFR followed clearly and high vigilance for hearing loss on gent  We have stopped the ARB and would be VERY CAUTIOUS with diuretics       LOS: 7 days   [name] 06/08/2016, 11:36 AM

## 2016-06-08 NOTE — Progress Notes (Signed)
Pharmacy Antibiotic Note  Toni Hanna is a 80 y.o. female admitted on 06/01/2016 with Group B Strep endocarditis.  Pharmacy has been consulted for gentamicin dosing for synergy. ID recommends 6 weeks of ceftriaxone and 2 weeks of gentamicin synergy starting on 10/7.  Continues on day #3/42 of ceftriaxone and day #3/14 of gentamicin for group B strep prosthetic joint infx + bacteremia + endocarditis (small mobile vegetation on aortic root 10/5).  S/p treatment for Right breast cellulitis and E.coli UTI.  Hx left TKA10 years ago, fell 10/4 and spent 12 hrs on the floor; going to OR on 10/9 to remove prosthetic knee and place abx spacers.  Afebrile, WBC wnl. SCr stable, CrCl ~10ml/min. UOP not all charted but seems stable.  Plan: Continue ceftriaxone 2g IV Q24H Continue gentamicin 60mg  IV Q12H  Monitor clinical picture, renal function, GT weekly   Height: 5\' 3"  (160 cm) Weight: 182 lb (82.6 kg) IBW/kg (Calculated) : 52.4  Temp (24hrs), Avg:98.7 F (37.1 C), Min:97.9 F (36.6 C), Max:99.3 F (37.4 C)   Recent Labs Lab 06/01/16 1954 06/01/16 2018  06/02/16 0503  06/02/16 0716  06/04/16 0338 06/05/16 0500 06/06/16 0416 06/06/16 0930 06/06/16 1100 06/07/16 0600 06/08/16 0637  WBC  --   --   < >  --   < >  --   < > 7.2 6.9 6.9  --   --  6.2 6.9  CREATININE  --   --   < >  --   < >  --   < > 0.58 0.55 0.57  --   --  0.55 0.57  LATICACIDVEN 3.9* 5.41*  --  2.0*  --  1.7  --   --   --   --   --   --   --   --   GENTTROUGH  --   --   --   --   --   --   --   --   --   --  0.5  --   --   --   GENTPEAK  --   --   --   --   --   --   --   --   --   --   --  4.4*  --   --   < > = values in this interval not displayed.  Estimated Creatinine Clearance: 59 mL/min (by C-G formula based on SCr of 0.57 mg/dL).    Allergies  Allergen Reactions  . Amoxicillin Swelling  . Penicillins Swelling    Has patient had a PCN reaction causing immediate rash, facial/tongue/throat swelling, SOB or  lightheadedness with hypotension: Yes Has patient had a PCN reaction causing severe rash involving mucus membranes or skin necrosis: No Has patient had a PCN reaction that required hospitalization No Has patient had a PCN reaction occurring within the last 10 years: No If all of the above answers are "NO", then may proceed with Cephalosporin use.  Tolerated ceftriaxone     Antimicrobials this admission: Vanc 10/2 >> 10/4 Aztreonam 10/2 >> 10/4 Ancef 10/4>>10/4 CTX 10/4 >> Gent 10/5 >>  Dose adjustments this admission: 10/7 GP 4.4 (goal 3-4 mcg/mL); GT 0.5 (goal < 1 mcg/mL) >> con't 60mg  q12  Microbiology results: 10/2 Synovial fluid cx - Strep aglactiae 10/3 MRSA PCR - negative 10/2 UCx - 100K E.coli 10/2 BCx2 - Strep agalactiae 1 of 2 (BCID same, S- amp, CTX, LVQ, vanc) 10/3 BCx2 - NGTD  Elenor Quinones,  PharmD, BCPS Clinical Pharmacist Pager [phone] 06/08/2016 7:46 AM

## 2016-06-08 NOTE — Brief Op Note (Signed)
06/08/2016  6:56 PM  PATIENT:  Toni Hanna  80 y.o. female  PRE-OPERATIVE DIAGNOSIS:  infected left total knee  POST-OPERATIVE DIAGNOSIS:  Infected left total knee  PROCEDURE:  Procedure(s): EXCISIONAL TOTAL KNEE ARTHROPLASTY WITH ANTIBIOTIC SPACERS (Left)  SURGEON:  Surgeon(s) and Role:    * Rod Can, MD - Primary  PHYSICIAN ASSISTANT: none  ASSISTANTS: April green, rnfa   ANESTHESIA:   general  EBL:  Total I/O In: 1100 [I.V.:600; IV Piggyback:500] Out: 2200 [Urine:1900; Blood:300]  BLOOD ADMINISTERED:none  DRAINS: none   LOCAL MEDICATIONS USED:  MARCAINE     SPECIMEN:  Aspirate  DISPOSITION OF SPECIMEN:  micro  COUNTS:  YES  TOURNIQUET:   Total Tourniquet Time Documented: Thigh (Left) - 76 minutes Total: Thigh (Left) - 76 minutes   DICTATION: .Other Dictation: Dictation Number [id number]  PLAN OF CARE: Admit to inpatient   PATIENT DISPOSITION:  PACU - hemodynamically stable.   Delay start of Pharmacological VTE agent (>24hrs) due to surgical blood loss or risk of bleeding: no

## 2016-06-08 NOTE — H&P (View-Only) (Signed)
ORTHOPAEDIC CONSULTATION  REQUESTING PHYSICIAN: Cristal Ford, DO  PCP:  Robert Bellow, MD  Chief Complaint: Periprosthetic left knee infection  HPI: Toni Hanna is a 80 y.o. female who complains of approximately 1 week of increasing left knee pain and swelling. She has a history of a left total knee replacement done about 10 years ago in [place]. She reports no previous pain in her left knee or problems with wound healing after surgery. The patient resides in [place], and she was visiting her vacation home in [place]. She went to the emergency department at [facility], where an aspiration of the left knee was performed. Cultures are now growing GBS. X-rays of the left knee were obtained showing a cemented well fixed posterior stabilized total knee replacement. She was transferred to [facility] and admitted to the hospitalist service. Infectious disease has seen the patient. Patient's hemoglobin A1c is 10.4. Blood cultures are now positive for GBS. She reports that she has been noncompliant with her diabetic diet. She was found to have a mastitis of the breast which has subsequently improved on IV antibiotics. She takes Coumadin for atrial fibrillation. Her INR today is greater than 2.  Past Medical History:  Diagnosis Date  . Breast cancer ([place]) 2015  . Coronary artery disease   . Diabetes mellitus without complication ([place])   . Hypertension    Past Surgical History:  Procedure Laterality Date  . BREAST LUMPECTOMY Right   . CARDIAC SURGERY    . cataract surgery    . CHOLECYSTECTOMY    . EYE SURGERY     Social History   Social History  . Marital status: Widowed    Spouse name: N/A  . Number of children: N/A  . Years of education: N/A   Social History Main Topics  . Smoking status: Former Research scientist (life sciences)  . Smokeless tobacco: Never Used  . Alcohol use No  . Drug use: No  . Sexual activity: Not Asked   Other Topics Concern  . None   Social History  Narrative  . None   Family History  Problem Relation Age of Onset  . Heart failure Mother   . Asthma Father    Allergies  Allergen Reactions  . Amoxicillin Swelling  . Penicillins Swelling    Has patient had a PCN reaction causing immediate rash, facial/tongue/throat swelling, SOB or lightheadedness with hypotension: Yes Has patient had a PCN reaction causing severe rash involving mucus membranes or skin necrosis: No Has patient had a PCN reaction that required hospitalization No Has patient had a PCN reaction occurring within the last 10 years: No If all of the above answers are "NO", then may proceed with Cephalosporin use.    Prior to Admission medications   Medication Sig Start Date End Date Taking? Authorizing Provider  anastrozole (ARIMIDEX) 1 MG tablet Take 1 mg by mouth daily.   Yes Historical Provider, MD  dronedarone (MULTAQ) 400 MG tablet Take 400 mg by mouth 2 (two) times daily with a meal.   Yes Historical Provider, MD  furosemide (LASIX) 40 MG tablet Take 40 mg by mouth daily.   Yes Historical Provider, MD  glipiZIDE (GLUCOTROL XL) 10 MG 24 hr tablet Take 10 mg by mouth daily with breakfast.    Yes Historical Provider, MD  losartan (COZAAR) 100 MG tablet Take 100 mg by mouth daily.   Yes Historical Provider, MD  metFORMIN (GLUCOPHAGE-XR) 500 MG 24 hr tablet Take 500 mg by mouth 2 (  two) times daily.   Yes Historical Provider, MD  metoprolol succinate (TOPROL-XL) 25 MG 24 hr tablet Take 25 mg by mouth 2 (two) times daily.   Yes Historical Provider, MD  Multiple Vitamins-Minerals (PRESERVISION AREDS 2+MULTI VIT PO) Take 2 tablets by mouth daily.   Yes Historical Provider, MD  tolterodine (DETROL) 2 MG tablet Take 2-4 mg by mouth 2 (two) times daily. Two tablets in the morning and one tablet in the evening   Yes Historical Provider, MD  WARFARIN SODIUM PO Take 5 mg by mouth daily. Dose from [facility] [place], [place], Dr Dierdre Searles at [phone]   Yes Historical Provider, MD    zaleplon (SONATA) 5 MG capsule Take 5 mg by mouth at bedtime as needed for sleep.   Yes Historical Provider, MD   Dg Chest Port 1 View  Result Date: 06/01/2016 CLINICAL DATA:  Sepsis. EXAM: PORTABLE CHEST 1 VIEW COMPARISON:  None. FINDINGS: Borderline enlarged cardiac silhouette. Median sternotomy wires and prosthetic heart valve. Linear density in both lower lung zones. Surgical clips overlying the right mid lung zone. Unremarkable bones. IMPRESSION: Bilateral lower lung zone linear atelectasis or scarring. Electronically Signed   By: Claudie Revering M.D.   On: 06/01/2016 19:50    Positive ROS: All other systems have been reviewed and were otherwise negative with the exception of those mentioned in the HPI and as above.  Physical Exam: General: Alert, no acute distress Cardiovascular: No pedal edema Respiratory: No cyanosis, no use of accessory musculature GI: No organomegaly, abdomen is soft and non-tender Skin: No lesions in the area of chief complaint Neurologic: Sensation intact distally Psychiatric: Patient is competent for consent with normal mood and affect Lymphatic: No axillary or cervical lymphadenopathy  MUSCULOSKELETAL: Examination of the left knee reveals a well-healed midline incision. She has a large effusion with some warmth. Range of motion is limited due to knee pain. Distally, she has intact dorsiflexion, plantarflexion, and great toe extension. She reports intact sensation to light touch. She has a 2+ DP pulse.  Assessment: GBS periprosthetic joint infection, left knee. GBS bacteremia Mastitis  Plan: I discussed the findings with the patient and her son who was at the bedside. We discussed that the gold standard treatment for her infected knee replacement is a two-stage procedure. We discussed the risks, benefits, and alternatives to resection left total knee arthroplasty with placement of antibiotic impregnated spacer. They understand that she will need prolonged IV  antibiotics and a PICC line. We discussed the importance of improved glycemic control looking forward to her reimplantation procedure in the future. I spoke with Dr. Ree Kida regarding the patient's anticoagulation. She is going to give oral vitamin K, discontinue the patient's Coumadin, and start an IV heparin drip for now. We will plan for surgery Friday morning so long as her INR is within the appropriate range. The family reports that they're interested in transferring the patient to [place] after the surgery is done.  The risks, benefits, and alternatives were discussed with the patient. There are risks associated with the surgery including, but not limited to, problems with anesthesia (death), infection, instability (giving out of the joint), dislocation, differences in leg length/angulation/rotation, fracture of bones, loosening or failure of implants, hematoma (blood accumulation) which may require surgical drainage, blood clots, pulmonary embolism, nerve injury (foot drop), and blood vessel injury. The patient understands these risks and elects to proceed.   Nastacia Raybuck, Horald Pollen, MD Cell [phone]    06/03/2016 6:48 PM

## 2016-06-08 NOTE — Anesthesia Procedure Notes (Signed)
Procedure Name: Intubation Date/Time: 06/08/2016 4:00 PM Performed by: Jacquiline Doe A Pre-anesthesia Checklist: Patient identified, Emergency Drugs available, Suction available and Patient being monitored Patient Re-evaluated:Patient Re-evaluated prior to inductionOxygen Delivery Method: Circle System Utilized and Circle system utilized Preoxygenation: Pre-oxygenation with 100% oxygen Intubation Type: IV induction and Cricoid Pressure applied Ventilation: Mask ventilation without difficulty Laryngoscope Size: Mac and 3 Grade View: Grade I Tube type: Oral Tube size: 7.5 mm Number of attempts: 1 Airway Equipment and Method: Stylet and Oral airway Placement Confirmation: ETT inserted through vocal cords under direct vision,  positive ETCO2 and breath sounds checked- equal and bilateral Secured at: 22 cm Tube secured with: Tape Dental Injury: Teeth and Oropharynx as per pre-operative assessment

## 2016-06-09 DIAGNOSIS — T8454XD Infection and inflammatory reaction due to internal left knee prosthesis, subsequent encounter: Secondary | ICD-10-CM

## 2016-06-09 LAB — BASIC METABOLIC PANEL
Anion gap: 10 (ref 5–15)
CHLORIDE: 100 mmol/L — AB (ref 101–111)
CO2: 27 mmol/L (ref 22–32)
CREATININE: 0.64 mg/dL (ref 0.44–1.00)
Calcium: 8.1 mg/dL — ABNORMAL LOW (ref 8.9–10.3)
Glucose, Bld: 191 mg/dL — ABNORMAL HIGH (ref 65–99)
POTASSIUM: 3.7 mmol/L (ref 3.5–5.1)
SODIUM: 137 mmol/L (ref 135–145)

## 2016-06-09 LAB — CBC
HCT: 26.7 % — ABNORMAL LOW (ref 36.0–46.0)
HEMOGLOBIN: 8.4 g/dL — AB (ref 12.0–15.0)
MCH: 27.8 pg (ref 26.0–34.0)
MCHC: 31.5 g/dL (ref 30.0–36.0)
MCV: 88.4 fL (ref 78.0–100.0)
PLATELETS: 175 10*3/uL (ref 150–400)
RBC: 3.02 MIL/uL — AB (ref 3.87–5.11)
RDW: 15.9 % — ABNORMAL HIGH (ref 11.5–15.5)
WBC: 7.1 10*3/uL (ref 4.0–10.5)

## 2016-06-09 LAB — GLUCOSE, CAPILLARY
GLUCOSE-CAPILLARY: 250 mg/dL — AB (ref 65–99)
Glucose-Capillary: 175 mg/dL — ABNORMAL HIGH (ref 65–99)
Glucose-Capillary: 235 mg/dL — ABNORMAL HIGH (ref 65–99)
Glucose-Capillary: 223 mg/dL — ABNORMAL HIGH (ref 65–99)

## 2016-06-09 LAB — PROTIME-INR
INR: 1.66
Prothrombin Time: 19.8 seconds — ABNORMAL HIGH (ref 11.4–15.2)

## 2016-06-09 LAB — ABO/RH: ABO/RH(D): A POS

## 2016-06-09 MED ORDER — WARFARIN SODIUM 5 MG PO TABS
5.0000 mg | ORAL_TABLET | Freq: Once | ORAL | Status: AC
  Administered 2016-06-09: 5 mg via ORAL
  Filled 2016-06-09: qty 1

## 2016-06-09 MED ORDER — METOPROLOL SUCCINATE ER 25 MG PO TB24
50.0000 mg | ORAL_TABLET | Freq: Every day | ORAL | Status: DC
Start: 2016-06-10 — End: 2016-06-10
  Filled 2016-06-09: qty 2

## 2016-06-09 NOTE — Progress Notes (Signed)
ANTICOAGULATION CONSULT NOTE - Follow Up Consult  Pharmacy Consult:  Heparin Indication: atrial fibrillation   Patient Measurements: Height: 5\' 3"  (160 cm) Weight: 182 lb (82.6 kg) IBW/kg (Calculated) : 52.4 Heparin Dosing Weight: 71 kg  Vital Signs: Temp: 99.1 F (37.3 C) (10/10 0734) Temp Source: Oral (10/10 0734) BP: 179/76 (10/10 0800) Pulse Rate: 95 (10/10 0800)  Labs:  Recent Labs  06/06/16 1400 06/07/16 0015  06/07/16 0600 06/07/16 0920 06/08/16 0500 06/08/16 0637 06/09/16 0307 06/09/16 1046  HGB  --   --   < > 9.9*  --   --  10.0* 8.4*  --   HCT  --   --   --  31.0*  --   --  31.1* 26.7*  --   PLT  --   --   --  187  --   --  190 175  --   LABPROT  --   --   --  17.6*  --  16.7*  --   --  19.8*  INR  --   --   --  1.43  --  1.34  --   --  1.66  HEPARINUNFRC 1.60* <0.10*  --   --  0.32  --   --   --   --   CREATININE  --   --   --  0.55  --   --  0.57 0.64  --   < > = values in this interval not displayed.  Estimated Creatinine Clearance: 59 mL/min (by C-G formula based on SCr of 0.64 mg/dL).   Assessment: 79 YOF with history of afib (CHADsVASc = 5) and MVrepair, on coumadin PTA. Now s/p TKA and antibiotic spacers on 10/9   INR on admit was 3.15 and s/p vitamin K x2 doses.   Home coumadin dose: 5mg /day  Patient now on bridge back to warfarin with enoxaparin 40 mg q24h INR this am 1.66  Goal of Therapy:  INR 2-3 Monitor platelets by anticoagulation protocol: Yes  Plan:  -Coumadin 5mg  po today -Daily INR -F/U dc of enoxaparin with INR at goal  Levester Fresh, PharmD, BCPS, [facility] Clinical Pharmacist Pager [phone] 06/09/2016 11:28 AM

## 2016-06-09 NOTE — Progress Notes (Signed)
Patient Name: Toni Hanna Date of Encounter: 06/09/2016  Hospital Problem List     Principal Problem:   Pyogenic arthritis of left knee joint ([place]) Active Problems:   Uncontrolled type 2 diabetes mellitus (HCC)   CAD (coronary artery disease)   Hypertension   UTI (urinary tract infection)   Hypophosphatemia   History of breast cancer   Sepsis ([place])   Cellulitis of breast   Group B streptococcal bacteriuria   Group B streptococcal infection   Sepsis due to group B Streptococcus (HCC)   Acute on chronic combined systolic and diastolic congestive heart failure (HCC)   Acute bacterial endocarditis   Prosthetic joint infection, subsequent encounter   Hyperglycemia   Wheezing   Infection of total knee replacement [facility])    Patient Profile     80 year old with history of mitral valve repair, prosthetic left knee replacement, breast cancer, visiting [place] who was admitted with right breast erythema, severe knee pain, chills, fatigue, blood cultures with group B strep.   Cardiology is following for her cardiac issues which include an echocardiogram demonstrating an ejection fraction of 30-35% with mitral valve repair, mild MR, moderate tricuspid regurgitation with moderately elevated pulmonary pressures.  She did have an echocardiogram in June in [place], where she lives, which demonstrated an EF of 50-55%.  TEE done this admission demonstrated EF 40-45%, small oscillating densities on noncoronary and right aortic cuspsn  Subjective   She has been having SOB.  She had knee pain this morning.   Inpatient Medications    . anastrozole  1 mg Oral Daily  . cefTRIAXone (ROCEPHIN)  IV  2 g Intravenous Q24H  . docusate sodium  100 mg Oral BID  . dronedarone  400 mg Oral BID WC  . enoxaparin (LOVENOX) injection  40 mg Subcutaneous Q24H  . furosemide  40 mg Intravenous BID  . gentamicin  60 mg Intravenous Q12H  . Influenza vac split quadrivalent PF  0.5 mL Intramuscular  Tomorrow-1000  . insulin aspart  0-5 Units Subcutaneous QHS  . insulin aspart  0-9 Units Subcutaneous TID WC  . insulin glargine  18 Units Subcutaneous QHS  . losartan  100 mg Oral Daily  . metoprolol succinate  37.5 mg Oral Daily  . multivitamin  2 tablet Oral Daily  . mupirocin ointment  1 application Nasal BID  . oxybutynin  10 mg Oral QHS  . pantoprazole  40 mg Oral Daily  . saccharomyces boulardii  250 mg Oral BID  . senna  1 tablet Oral BID  . sodium chloride flush  10-40 mL Intracatheter Q12H  . Warfarin - Pharmacist Dosing Inpatient   Does not apply q1800    Vital Signs    Vitals:   06/09/16 0500 06/09/16 0600 06/09/16 0734 06/09/16 0800  BP: (!) 167/69 (!) 166/84 (!) 187/82 (!) 179/76  Pulse: 84 92 98 95  Resp: (!) 29 (!) 29 (!) 28 (!) 30  Temp:   99.1 F (37.3 C)   TempSrc:   Oral   SpO2: 99% 99% 98% 96%  Weight:      Height:        Intake/Output Summary (Last 24 hours) at 06/09/16 1028 Last data filed at 06/09/16 1003  Gross per 24 hour  Intake          3531.67 ml  Output             2500 ml  Net  1031.67 ml   Filed Weights   06/01/16 1509  Weight: 182 lb (82.6 kg)    Physical Exam    GEN: Well nourished, well developed, in  no acute distress.  Neck: Supple, no JVD, carotid bruits, or masses. Cardiac: RRR, no  rubs, or gallops. No clubbing, cyanosis, moderate edema.  Radials/DP/PT 2+ and equal bilaterally.  Respiratory:  Respirations  regular and unlabored, few wheezes GI: Soft, nontender, nondistended, BS + x 4. Neuro:  Strength and sensation are intact.   Labs    CBC  Recent Labs  06/08/16 0637 06/09/16 0307  WBC 6.9 7.1  HGB 10.0* 8.4*  HCT 31.1* 26.7*  MCV 88.1 88.4  PLT 190 0000000   Basic Metabolic Panel  Recent Labs  06/07/16 0600 06/08/16 0637 06/09/16 0307  NA 137 137 137  K 3.3* 3.5 3.7  CL 102 102 100*  CO2 25 27 27   GLUCOSE 167* 146* 191*  BUN <5* <5* <5*  CREATININE 0.55 0.57 0.64  CALCIUM 8.4* 8.2* 8.1*  MG  1.3*  --   --   PHOS 2.6  --   --    Liver Function Tests No results for input(s): AST, ALT, ALKPHOS, BILITOT, PROT, ALBUMIN in the last 72 hours. No results for input(s): LIPASE, AMYLASE in the last 72 hours. Cardiac Enzymes No results for input(s): CKTOTAL, CKMB, CKMBINDEX, TROPONINI in the last 72 hours. BNP Invalid input(s): POCBNP D-Dimer No results for input(s): DDIMER in the last 72 hours. Hemoglobin A1C No results for input(s): HGBA1C in the last 72 hours. Fasting Lipid Panel No results for input(s): CHOL, HDL, LDLCALC, TRIG, CHOLHDL, LDLDIRECT in the last 72 hours. Thyroid Function Tests No results for input(s): TSH, T4TOTAL, T3FREE, THYROIDAB in the last 72 hours.  Invalid input(s): FREET3  Telemetry    NSR, sinus tach and atrial tach.   ECG    NA  Radiology    Dg Chest Port 1 View  Result Date: 06/06/2016 CLINICAL DATA:  Wheezing with shortness of breath and chest pain EXAM: PORTABLE CHEST 1 VIEW COMPARISON:  June 04, 2016 FINDINGS: Central catheter tip remains in the superior vena cava. No pneumothorax. There is patchy bibasilar atelectasis, stable. There is a small left pleural effusion. There is cardiomegaly with mild pulmonary venous hypertension. There is a prosthetic valve present. There is atherosclerotic calcification aorta. No adenopathy. IMPRESSION: Pulmonary vascular congestion. No frank edema or consolidation. Small left pleural effusion. Stable patchy bibasilar atelectasis. No new opacity evident. Electronically Signed   By: Lowella Grip III M.D.   On: 06/06/2016 11:48   Dg Chest Port 1 View  Result Date: 06/04/2016 CLINICAL DATA:  Left-sided PICC line insertion EXAM: PORTABLE CHEST 1 VIEW COMPARISON:  06/01/2016 FINDINGS: Left-sided PICC line terminates in the distal SVC. Heart is enlarged. There is atelectasis at each lung base and lingula. Subtle confluent pulmonary opacities in the right lung base cannot exclude a small pneumonic consolidation.  Aortic atherosclerosis at its arch. Cardiac valvular prosthetic with median sternotomy sutures are again noted. Surgical clips project over the right mid hemithorax. IMPRESSION: PICC line tip in the distal SVC from left-sided approach. Bibasilar atelectasis again noted. Possible small focus of right basilar pneumonia. Electronically Signed   By: Ashley Royalty M.D.   On: 06/04/2016 17:13   Dg Chest Port 1 View  Result Date: 06/01/2016 CLINICAL DATA:  Sepsis. EXAM: PORTABLE CHEST 1 VIEW COMPARISON:  None. FINDINGS: Borderline enlarged cardiac silhouette. Median sternotomy wires and prosthetic heart valve. Linear density  in both lower lung zones. Surgical clips overlying the right mid lung zone. Unremarkable bones. IMPRESSION: Bilateral lower lung zone linear atelectasis or scarring. Electronically Signed   By: Claudie Revering M.D.   On: 06/01/2016 19:50   Dg Knee Left Port  Result Date: 06/08/2016 CLINICAL DATA:  History of infected left total knee. Excisional total knee arthroplasty with antibiotic spacers. EXAM: PORTABLE LEFT KNEE - 1-2 VIEW COMPARISON:  06/01/2016 FINDINGS: Removal of the knee arthroplasty and placement of antibiotic spacers. Femoral and tibial stems are completely visualized. No evidence for a periprosthetic fracture. There appears to be a surgical drain. Gas in soft tissues related to recent surgery. Normal alignment of the knee. IMPRESSION: Removal of knee arthroplasty and placement of antibiotic spacers. Electronically Signed   By: Markus Daft M.D.   On: 06/08/2016 20:27   Dg Knee Left Port  Result Date: 06/01/2016 CLINICAL DATA:  Pain and swelling since Saturday.  No known injury. EXAM: PORTABLE LEFT KNEE - 1-2 VIEW COMPARISON:  None. FINDINGS: The left knee demonstrates a total knee arthroplasty without evidence of hardware failure complication. There is a small joint effusion. There is no fracture or dislocation. The alignment is anatomic. IMPRESSION: Left total knee arthroplasty  without failure or complication. Electronically Signed   By: Kathreen Devoid   On: 06/01/2016 16:22    Assessment & Plan    ACUTE SYSTOLIC HF:   She is many liters positive since admission.  He is having brisk UO with her Lasix.  I will continue with dose..    ATRIAL FIB:  She is currently in sinus and we have chosen to continue Multaq.  Warfarin on hold.  We will follow her rhythm.  Restart warfarin when OK with surgery.   ENDOCARDITIS:  We will plan on 6 weeks of IV ceftriaxone and 2 weeks of IV gentamicin with day #1 being Saturday.  Removal of left knee prosthesis today.   HTN:  BP is increased.  I will increase the beta blocker.    Signed, Minus Breeding, MD  06/09/2016, 10:28 AM

## 2016-06-09 NOTE — Progress Notes (Signed)
Subjective: Patient reports pain as moderate.  Denies N/V/CP/SOB.  Objective:   VITALS:   Vitals:   06/09/16 0400 06/09/16 0500 06/09/16 0600 06/09/16 0734  BP: (!) 160/72 (!) 167/69 (!) 166/84 (!) 187/82  Pulse: 82 84 92 98  Resp: (!) 21 (!) 29 (!) 29 (!) 28  Temp:    99.1 F (37.3 C)  TempSrc:    Oral  SpO2: 99% 99% 99% 98%  Weight:      Height:        NAD ABD soft Sensation intact distally Intact pulses distally Dorsiflexion/Plantar flexion intact Incision: dressing C/D/I Compartment soft HV ss   Lab Results  Component Value Date   WBC 7.1 06/09/2016   HGB 8.4 (L) 06/09/2016   HCT 26.7 (L) 06/09/2016   MCV 88.4 06/09/2016   PLT 175 06/09/2016   BMET    Component Value Date/Time   NA 137 06/09/2016 0307   K 3.7 06/09/2016 0307   CL 100 (L) 06/09/2016 0307   CO2 27 06/09/2016 0307   GLUCOSE 191 (H) 06/09/2016 0307   BUN <5 (L) 06/09/2016 0307   CREATININE 0.64 06/09/2016 0307   CALCIUM 8.1 (L) 06/09/2016 0307   GFRNONAA >60 06/09/2016 0307   GFRAA >60 06/09/2016 0307    Recent Results (from the past 240 hour(s))  Gram stain     Status: None   Collection Time: 06/01/16  4:50 PM  Result Value Ref Range Status   Specimen Description FLUID SYNOVIAL LEFT KNEE  Final   Special Requests NONE  Final   Gram Stain   Final    ABUNDANT WBC PRESENT, PREDOMINANTLY PMN RARE INTRACELLULAR GRAM POSITIVE COCCI Gram Stain Report Called to,Read Back By and Verified With: EDWARDS,C. AT 1804 ON 06/01/2016 BY BAUGHAM,M.    Report Status 06/01/2016 FINAL  Final  Culture, body fluid-bottle     Status: Abnormal   Collection Time: 06/01/16  4:50 PM  Result Value Ref Range Status   Specimen Description FLUID SYNOVIAL LEFT KNEE  Final   Special Requests BOTTLES DRAWN AEROBIC AND ANAEROBIC 3CC EACH  Final   Gram Stain   Final    GRAM POSITIVE COCCI IN CHAINS IN BOTH AEROBIC AND ANAEROBIC BOTTLES Gram Stain Report Called to,Read Back By and Verified With: REAP B AT [place] AT  0600 ON [id number] BY FORSYTH K Performed at [facility] B STREP(S.AGALACTIAE)ISOLATED (A)  Final   Report Status 06/04/2016 FINAL  Final   Organism ID, Bacteria GROUP B STREP(S.AGALACTIAE)ISOLATED  Final      Susceptibility   Group b strep(s.agalactiae)isolated - MIC*    CLINDAMYCIN >=1 RESISTANT Resistant     AMPICILLIN <=0.25 SENSITIVE Sensitive     ERYTHROMYCIN >=8 RESISTANT Resistant     VANCOMYCIN 0.5 SENSITIVE Sensitive     CEFTRIAXONE <=0.12 SENSITIVE Sensitive     LEVOFLOXACIN 1 SENSITIVE Sensitive     * GROUP B STREP(S.AGALACTIAE)ISOLATED  Blood Culture (routine x 2)     Status: Abnormal   Collection Time: 06/01/16  7:42 PM  Result Value Ref Range Status   Specimen Description BLOOD LEFT HAND  Final   Special Requests BOTTLES DRAWN AEROBIC ONLY 6CC  Final   Culture  Setup Time   Final    GRAM POSITIVE COCCI IN CHAINS RECOVERED FROM THE AEROBIC BOTTLE Gram Stain Report Called to,Read Back By and Verified With: AGUIRRE,A. AT [id number] ON 06/02/2016 BY BAUGHAM,M. Performed at [facility] Organism ID to follow CRITICAL RESULT  CALLED TO, READ BACK BY AND VERIFIED WITH: G ABBOTT PHARMD 2300 06/02/16 A BROWNING Performed at [place] B STREP(S.AGALACTIAE)ISOLATED (A)  Final   Report Status 06/04/2016 FINAL  Final   Organism ID, Bacteria GROUP B STREP(S.AGALACTIAE)ISOLATED  Final      Susceptibility   Group b strep(s.agalactiae)isolated - MIC*    CLINDAMYCIN >=1 RESISTANT Resistant     AMPICILLIN <=0.25 SENSITIVE Sensitive     ERYTHROMYCIN >=8 RESISTANT Resistant     VANCOMYCIN 0.5 SENSITIVE Sensitive     CEFTRIAXONE <=0.12 SENSITIVE Sensitive     LEVOFLOXACIN 1 SENSITIVE Sensitive     * GROUP B STREP(S.AGALACTIAE)ISOLATED  Blood Culture ID Panel (Reflexed)     Status: Abnormal   Collection Time: 06/01/16  7:42 PM  Result Value Ref Range Status   Enterococcus species NOT DETECTED NOT DETECTED Final   Vancomycin resistance  NOT DETECTED NOT DETECTED Final   Listeria monocytogenes NOT DETECTED NOT DETECTED Final   Staphylococcus species NOT DETECTED NOT DETECTED Final   Staphylococcus aureus NOT DETECTED NOT DETECTED Final   Methicillin resistance NOT DETECTED NOT DETECTED Final   Streptococcus species DETECTED (A) NOT DETECTED Final    Comment: CRITICAL RESULT CALLED TO, READ BACK BY AND VERIFIED WITH: G ABBOTT PHARMD 2300 06/02/16 A BROWNING    Streptococcus agalactiae DETECTED (A) NOT DETECTED Final    Comment: CRITICAL RESULT CALLED TO, READ BACK BY AND VERIFIED WITH: G ABBOTT PHARMD 2300 06/02/16 A BROWNING    Streptococcus pneumoniae NOT DETECTED NOT DETECTED Final   Streptococcus pyogenes NOT DETECTED NOT DETECTED Final   Acinetobacter baumannii NOT DETECTED NOT DETECTED Final   Enterobacteriaceae species NOT DETECTED NOT DETECTED Final   Enterobacter cloacae complex NOT DETECTED NOT DETECTED Final   Escherichia coli NOT DETECTED NOT DETECTED Final   Klebsiella oxytoca NOT DETECTED NOT DETECTED Final   Klebsiella pneumoniae NOT DETECTED NOT DETECTED Final   Proteus species NOT DETECTED NOT DETECTED Final   Serratia marcescens NOT DETECTED NOT DETECTED Final   Carbapenem resistance NOT DETECTED NOT DETECTED Final   Haemophilus influenzae NOT DETECTED NOT DETECTED Final   Neisseria meningitidis NOT DETECTED NOT DETECTED Final   Pseudomonas aeruginosa NOT DETECTED NOT DETECTED Final   Candida albicans NOT DETECTED NOT DETECTED Final   Candida glabrata NOT DETECTED NOT DETECTED Final   Candida krusei NOT DETECTED NOT DETECTED Final   Candida parapsilosis NOT DETECTED NOT DETECTED Final   Candida tropicalis NOT DETECTED NOT DETECTED Final    Comment: Performed at [facility]  Blood Culture (routine x 2)     Status: None   Collection Time: 06/01/16  7:54 PM  Result Value Ref Range Status   Specimen Description BLOOD LEFT HAND  Final   Special Requests BOTTLES DRAWN AEROBIC ONLY [place]  Final    Culture NO GROWTH 5 DAYS  Final   Report Status 06/06/2016 FINAL  Final  Urine culture     Status: Abnormal   Collection Time: 06/01/16 10:01 PM  Result Value Ref Range Status   Specimen Description URINE, CLEAN CATCH  Final   Special Requests NONE  Final   Culture >=100,000 COLONIES/mL ESCHERICHIA COLI (A)  Final   Report Status 06/04/2016 FINAL  Final   Organism ID, Bacteria ESCHERICHIA COLI (A)  Final      Susceptibility   Escherichia coli - MIC*    AMPICILLIN >=32 RESISTANT Resistant     CEFAZOLIN 32 INTERMEDIATE Intermediate  CEFTRIAXONE <=1 SENSITIVE Sensitive     CIPROFLOXACIN <=0.25 SENSITIVE Sensitive     GENTAMICIN <=1 SENSITIVE Sensitive     IMIPENEM <=0.25 SENSITIVE Sensitive     NITROFURANTOIN <=16 SENSITIVE Sensitive     TRIMETH/SULFA <=20 SENSITIVE Sensitive     AMPICILLIN/SULBACTAM >=32 RESISTANT Resistant     PIP/TAZO 8 SENSITIVE Sensitive     Extended ESBL NEGATIVE Sensitive     * >=100,000 COLONIES/mL ESCHERICHIA COLI  MRSA PCR Screening     Status: None   Collection Time: 06/02/16  2:50 AM  Result Value Ref Range Status   MRSA by PCR NEGATIVE NEGATIVE Final    Comment:        The GeneXpert MRSA Assay (FDA approved for NASAL specimens only), is one component of a comprehensive MRSA colonization surveillance program. It is not intended to diagnose MRSA infection nor to guide or monitor treatment for MRSA infections.   Culture, blood (Routine X 2) w Reflex to ID Panel     Status: None   Collection Time: 06/02/16  6:55 PM  Result Value Ref Range Status   Specimen Description BLOOD LEFT HAND  Final   Special Requests BOTTLES DRAWN AEROBIC ONLY 5CC  Final   Culture NO GROWTH 5 DAYS  Final   Report Status 06/07/2016 FINAL  Final  Culture, blood (Routine X 2) w Reflex to ID Panel     Status: None   Collection Time: 06/02/16  7:05 PM  Result Value Ref Range Status   Specimen Description BLOOD LEFT ANTECUBITAL  Final   Special Requests BOTTLES DRAWN  AEROBIC AND ANAEROBIC 10 CC EA  Final   Culture NO GROWTH 5 DAYS  Final   Report Status 06/07/2016 FINAL  Final  Surgical PCR screen     Status: Abnormal   Collection Time: 06/08/16 12:34 AM  Result Value Ref Range Status   MRSA, PCR NEGATIVE NEGATIVE Final   Staphylococcus aureus POSITIVE (A) NEGATIVE Final    Comment:        The Xpert SA Assay (FDA approved for NASAL specimens in patients over 36 years of age), is one component of a comprehensive surveillance program.  Test performance has been validated by [facility] for patients greater than or equal to 53 year old. It is not intended to diagnose infection nor to guide or monitor treatment.   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) Active Problems:   Uncontrolled type 2 diabetes mellitus (HCC)   CAD (coronary artery disease)   Hypertension   UTI (urinary tract infection)   Hypophosphatemia   History of breast cancer   Sepsis ([place])   Cellulitis of breast   Group B streptococcal bacteriuria   Group B streptococcal infection   Sepsis due to group B Streptococcus (HCC)   Acute on chronic combined systolic and diastolic congestive heart failure (HCC)   Acute bacterial endocarditis   Prosthetic joint infection, subsequent encounter   Hyperglycemia   Wheezing   Infection of total  knee replacement (HCC)   A/P: 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

## 2016-06-09 NOTE — Care Management Important Message (Signed)
Important Message  Patient Details  Name: Toni Hanna MRN: [id number] Date of Birth: 04/13/1936   Medicare Important Message Given:  Yes    [name] Abena 06/09/2016, 8:58 AM

## 2016-06-09 NOTE — Progress Notes (Signed)
PROGRESS NOTE    Toni Hanna  [id number] DOB: June 14, 1936 DOA: 06/01/2016 PCP: Robert Bellow, MD   Chief Complaint  Patient presents with  . Knee Pain  . Hyperglycemia     Brief Narrative:  HPI on 06/01/2016 by Dr. Jackquline Berlin a 80 y.o.femalewith medical history significant of breast cancer, history of right breast lumpectomy, CAD, type 2 diabetes, hypertension who is coming to the emergency department due to left knee pain. Per patient, she started having left knee pain on Friday which continuedto get worse through the weekend. She had a left knee arthroplasty in [place] about 10 years ago. She states that she fell on the floor on Sunday evening due to weakness and was unable to get up from the floor, so she decided to go to sleep. This morning, when she woke up, she crawled to the other room to reach her phone, madea phone call for help and was subsequently brought to the emergency department. She estimates that she spent about 12 hours on the floor. She denies fever, but complains of chills, fatigue, generalized weakness, decreased appetite. Dr. Sabra Heck from the emergency department and spoke to Dr. Swintek(orthopedic surgery) who suggested transfer to [facility] for further evaluation and treatment.   Interim history  Admitted for sepsis, found to have septic knee and endocarditis. S/p removal of prosthesis on 10/9. Continue IV ceftriaxone for 6 weeks, IV gentamicin for 2 weeks (1st day 10/7). Being treated for CHF as well. Cardiology, ortho, ID consulted.  Assessment & Plan   Sepsis secondary to septic arthritis left knee/Strep bacteremia/Endocarditis  -Upon admission, patient had mild leukocytosis, elevated lactic acid, soft BP.  She has been tachycardic and tachypneic.  -Fluid culture GPC in chains, culture strep agalactaie  -Blood cultures from 06/01/2016 show Strep agalactaie in 1/2 bottles -Initially placed on on Aztreonam, vanco  -Repeat Blood  cultures from 06/02/2016 show no growth to date -ID consulted and appreciated.  -Echocardiogram: EF 30-35%.  Spoke with hospitalist in [place], who states her EF was 50-55% in June 2017.  -Orthopedics consulted and appreciated- plan for resection arthroplasty, antibiotic spacer -Patient currently on ceftriaxone (given her PCN allergy- will watch and monitor very closely) -Cardiology consulted and appreciated, TEE showed EF 40-45%, small oscillating densities on noncoronary and right aortic cusps -ID added gentamicin -PICC lined placed  -s/p removal of prosthesis on 06/08/2016 -PT consulted  New systolic CHF with mild exacerbation -Echo as above -Cardiology consulted and appreciated -Continue current medications -monitor intake/output, daily weights -patient received IVF upon admission for sepsis -CXR obtained on 06/06/2016 showed pulmonary vascular congestion, no frank edema/consolidation. Small left pleural effusion -patient start on IV lasix -UOP over past 24 hours: 3100cc  Sepsis secondary to right breast cellulitis and ?UTI -Continue current IV antibiotic treatment as above -Urine culture: Ecoli -Erythema of the right breast resolved  Uncontrolled type 2 diabetes mellitus -Hold metformin, glipizide  -hemoglobin A1c 10.4 -Continue Lantus, ISS, CBG monitoring  -Increased Lantus to 18u (from 16)  Hypokalemia/ hypomagnesemia -Continue to replace, continue to monitor   Hypophosphatemia -Resolved with replacement  A Fib on coumadin -CHADSVASC 5 (age, gender, DM, HTN) -Rate controlled -Continue metoprolol -INR 1.66 today.  (of note, patient received Vitamin K 2.5mg  PO x2 doses this admission) -Patient restarted on coumadin. Heparin discontinued   Hypertension -Continue metoprolol, IV lasix  Lactic acidosis -Resolved with IVF   History of breast cancer -Continue Arimidex   Normocytic anemia/Anemia secondary to blood loss and  recent surgery -Hemoglobin currently  8.4 (was between 9-10) -Continue to monitor CBC -Transfuse if Hb drops <7  DVT Prophylaxis  Heparin  Code Status: Full  Family Communication: None at bedside.    Disposition Plan: Admitted. Continue to diurese.  Patient will likely need SNF in [place].   On 10/4: Patient's family would like patient transferred to [facility]. Patient was accepted in transfer, however, currently no beds available.   Consultants Orthopedics Infectious disease Cardiology  Procedures  PICC line placed Echocardiogram TEE Excisional total knee arthroplasty with antibiotic spacers (left)  Antibiotics   Anti-infectives    Start     Dose/Rate Route Frequency Ordered Stop   06/08/16 1718  tobramycin (NEBCIN) powder  Status:  Discontinued       As needed 06/08/16 1718 06/08/16 1921   06/08/16 1717  vancomycin (VANCOCIN) powder  Status:  Discontinued       As needed 06/08/16 1717 06/08/16 1921   06/08/16 1630  cefTRIAXone (ROCEPHIN) 2 g in dextrose 5 % 50 mL IVPB     2 g 100 mL/hr over 30 Minutes Intravenous To Surgery 06/08/16 1628 06/08/16 1700   06/05/16 0600  vancomycin (VANCOCIN) IVPB 1000 mg/200 mL premix     1,000 mg 200 mL/hr over 60 Minutes Intravenous On call to O.R. 06/04/16 1736 06/05/16 0646   06/04/16 2200  gentamicin (GARAMYCIN) IVPB 60 mg     60 mg 100 mL/hr over 30 Minutes Intravenous Every 12 hours 06/04/16 1558     06/03/16 1800  cefTRIAXone (ROCEPHIN) 2 g in dextrose 5 % 50 mL IVPB     2 g 100 mL/hr over 30 Minutes Intravenous Every 24 hours 06/03/16 1613     06/03/16 1230  ceFAZolin (ANCEF) IVPB 2g/100 mL premix  Status:  Discontinued     2 g 200 mL/hr over 30 Minutes Intravenous Every 8 hours 06/03/16 1132 06/03/16 1613   06/02/16 1800  vancomycin (VANCOCIN) 1,250 mg in sodium chloride 0.9 % 250 mL IVPB  Status:  Discontinued     1,250 mg 166.7 mL/hr over 90 Minutes Intravenous Every 24 hours 06/01/16 1910 06/03/16 1132   06/02/16 0300  aztreonam (AZACTAM)  2 g in dextrose 5 % 50 mL IVPB  Status:  Discontinued     2 g 100 mL/hr over 30 Minutes Intravenous Every 8 hours 06/01/16 1910 06/02/16 1711   06/01/16 1915  vancomycin (VANCOCIN) 1,500 mg in sodium chloride 0.9 % 500 mL IVPB     1,500 mg 250 mL/hr over 120 Minutes Intravenous  Once 06/01/16 1901 06/01/16 2249   06/01/16 1900  aztreonam (AZACTAM) 2 g in dextrose 5 % 50 mL IVPB     2 g 100 mL/hr over 30 Minutes Intravenous  Once 06/01/16 1850 06/01/16 2035   06/01/16 1900  vancomycin (VANCOCIN) IVPB 1000 mg/200 mL premix  Status:  Discontinued     1,000 mg 200 mL/hr over 60 Minutes Intravenous  Once 06/01/16 1850 06/01/16 1901      Subjective:   Toni Hanna seen and examined today.  Feels left knee pain is better after getting pain medications.  Feels mildly short of breath this morning.  Denies chest pain, abdominal pain, nausea, vomiting, diarrhea, constipation.  Worried about physical therapy.   Objective:   Vitals:   06/09/16 0500 06/09/16 0600 06/09/16 0734 06/09/16 0800  BP: (!) 167/69 (!) 166/84 (!) 187/82 (!) 179/76  Pulse: 84 92 98 95  Resp: (!) 29 (!) 29 (!) 28 (!)  30  Temp:   99.1 F (37.3 C)   TempSrc:   Oral   SpO2: 99% 99% 98% 96%  Weight:      Height:        Intake/Output Summary (Last 24 hours) at 06/09/16 1105 Last data filed at 06/09/16 1003  Gross per 24 hour  Intake          3531.67 ml  Output             2350 ml  Net          1181.67 ml   Filed Weights   06/01/16 1509  Weight: 82.6 kg (182 lb)    Exam  General: Well developed, well nourished, No distress  HEENT: NCAT,  mucous membranes moist.   Cardiovascular: S1 S2 auscultated, 2/6 SEM, irregular  Respiratory: Mild exp wheezing, diminished breath sounds  Abdomen: Soft, nontender, nondistended, + bowel sounds  Extremities: warm dry without cyanosis clubbing. LLE in immobilizer    Neuro: AAOx3, nonfocal  Psych: Appropriate mood and affect, pleasant   Data Reviewed: I have personally  reviewed following labs and imaging studies  CBC:  Recent Labs Lab 06/03/16 0212 06/04/16 0338 06/05/16 0500 06/06/16 0416 06/07/16 0600 06/08/16 0637 06/09/16 0307  WBC 6.1 7.2 6.9 6.9 6.2 6.9 7.1  NEUTROABS 4.5 5.1  --   --   --   --   --   HGB 10.8* 10.4* 10.2* 11.0* 9.9* 10.0* 8.4*  HCT 32.7* 32.5* 31.5* 34.1* 31.0* 31.1* 26.7*  MCV 86.3 86.9 86.8 87.2 87.3 88.1 88.4  PLT 114* 128* 147* 176 187 190 0000000   Basic Metabolic Panel:  Recent Labs Lab 06/03/16 0212 06/04/16 0338 06/05/16 0500 06/06/16 0416 06/07/16 0600 06/08/16 0637 06/09/16 0307  NA 132* 133* 135 135 137 137 137  K 3.0* 3.3* 3.6 3.5 3.3* 3.5 3.7  CL 103 101 100* 103 102 102 100*  CO2 22 22 23 24 25 27 27   GLUCOSE 212* 199* 212* 248* 167* 146* 191*  BUN 16 8 6 6  <5* <5* <5*  CREATININE 0.67 0.58 0.55 0.57 0.55 0.57 0.64  CALCIUM 7.7* 8.0* 8.5* 8.3* 8.4* 8.2* 8.1*  MG 1.6* 1.8  --   --  1.3*  --   --   PHOS 1.8*  --   --   --  2.6  --   --    GFR: Estimated Creatinine Clearance: 59 mL/min (by C-G formula based on SCr of 0.64 mg/dL). Liver Function Tests:  Recent Labs Lab 06/03/16 0212 06/04/16 0338  AST 17 22  ALT 17 19  ALKPHOS 55 79  BILITOT 1.3* 0.8  PROT 4.7* 4.9*  ALBUMIN 2.1* 2.1*   No results for input(s): LIPASE, AMYLASE in the last 168 hours. No results for input(s): AMMONIA in the last 168 hours. Coagulation Profile:  Recent Labs Lab 06/05/16 0500 06/05/16 1338 06/06/16 0416 06/07/16 0600 06/08/16 0500  INR 1.61 1.50 1.56 1.43 1.34   Cardiac Enzymes: No results for input(s): CKTOTAL, CKMB, CKMBINDEX, TROPONINI in the last 168 hours. BNP (last 3 results) No results for input(s): PROBNP in the last 8760 hours. HbA1C: No results for input(s): HGBA1C in the last 72 hours. CBG:  Recent Labs Lab 06/08/16 0855 06/08/16 1351 06/08/16 1927 06/08/16 2308 06/09/16 0732  GLUCAP 134* 90 134* 153* 175*   Lipid Profile: No results for input(s): CHOL, HDL, LDLCALC, TRIG,  CHOLHDL, LDLDIRECT in the last 72 hours. Thyroid Function Tests: No results for input(s): TSH, T4TOTAL, FREET4,  T3FREE, THYROIDAB in the last 72 hours. Anemia Panel: No results for input(s): VITAMINB12, FOLATE, FERRITIN, TIBC, IRON, RETICCTPCT in the last 72 hours. Urine analysis:    Component Value Date/Time   COLORURINE YELLOW 06/01/2016 2201   APPEARANCEUR HAZY (A) 06/01/2016 2201   LABSPEC 1.025 06/01/2016 2201   PHURINE 5.5 06/01/2016 2201   GLUCOSEU >1000 (A) 06/01/2016 2201   HGBUR MODERATE (A) 06/01/2016 2201   BILIRUBINUR NEGATIVE 06/01/2016 2201   KETONESUR NEGATIVE 06/01/2016 2201   PROTEINUR 30 (A) 06/01/2016 2201   NITRITE NEGATIVE 06/01/2016 2201   LEUKOCYTESUR SMALL (A) 06/01/2016 2201   Sepsis Labs: @LABRCNTIP (procalcitonin:4,lacticidven:4)  ) Recent Results (from the past 240 hour(s))  Gram stain     Status: None   Collection Time: 06/01/16  4:50 PM  Result Value Ref Range Status   Specimen Description FLUID SYNOVIAL LEFT KNEE  Final   Special Requests NONE  Final   Gram Stain   Final    ABUNDANT WBC PRESENT, PREDOMINANTLY PMN RARE INTRACELLULAR GRAM POSITIVE COCCI Gram Stain Report Called to,Read Back By and Verified With: EDWARDS,C. AT 1804 ON 06/01/2016 BY BAUGHAM,M.    Report Status 06/01/2016 FINAL  Final  Culture, body fluid-bottle     Status: Abnormal   Collection Time: 06/01/16  4:50 PM  Result Value Ref Range Status   Specimen Description FLUID SYNOVIAL LEFT KNEE  Final   Special Requests BOTTLES DRAWN AEROBIC AND ANAEROBIC 3CC EACH  Final   Gram Stain   Final    GRAM POSITIVE COCCI IN CHAINS IN BOTH AEROBIC AND ANAEROBIC BOTTLES Gram Stain Report Called to,Read Back By and Verified With: REAP B AT [place] AT 0600 ON [id number] BY FORSYTH K Performed at [facility]    Culture GROUP B STREP(S.AGALACTIAE)ISOLATED (A)  Final   Report Status 06/04/2016 FINAL  Final   Organism ID, Bacteria GROUP B STREP(S.AGALACTIAE)ISOLATED  Final       Susceptibility   Group b strep(s.agalactiae)isolated - MIC*    CLINDAMYCIN >=1 RESISTANT Resistant     AMPICILLIN <=0.25 SENSITIVE Sensitive     ERYTHROMYCIN >=8 RESISTANT Resistant     VANCOMYCIN 0.5 SENSITIVE Sensitive     CEFTRIAXONE <=0.12 SENSITIVE Sensitive     LEVOFLOXACIN 1 SENSITIVE Sensitive     * GROUP B STREP(S.AGALACTIAE)ISOLATED  Blood Culture (routine x 2)     Status: Abnormal   Collection Time: 06/01/16  7:42 PM  Result Value Ref Range Status   Specimen Description BLOOD LEFT HAND  Final   Special Requests BOTTLES DRAWN AEROBIC ONLY 6CC  Final   Culture  Setup Time   Final    GRAM POSITIVE COCCI IN CHAINS RECOVERED FROM THE AEROBIC BOTTLE Gram Stain Report Called to,Read Back By and Verified With: AGUIRRE,A. AT [id number] ON 06/02/2016 BY BAUGHAM,M. Performed at [place] ID to follow CRITICAL RESULT CALLED TO, READ BACK BY AND VERIFIED WITH: G ABBOTT PHARMD 2300 06/02/16 A BROWNING Performed at [facility] STREP(S.AGALACTIAE)ISOLATED (A)  Final   Report Status 06/04/2016 FINAL  Final   Organism ID, Bacteria GROUP B STREP(S.AGALACTIAE)ISOLATED  Final      Susceptibility   Group b strep(s.agalactiae)isolated - MIC*    CLINDAMYCIN >=1 RESISTANT Resistant     AMPICILLIN <=0.25 SENSITIVE Sensitive     ERYTHROMYCIN >=8 RESISTANT Resistant     VANCOMYCIN 0.5 SENSITIVE Sensitive     CEFTRIAXONE <=0.12 SENSITIVE Sensitive     LEVOFLOXACIN 1 SENSITIVE Sensitive     *  GROUP B STREP(S.AGALACTIAE)ISOLATED  Blood Culture ID Panel (Reflexed)     Status: Abnormal   Collection Time: 06/01/16  7:42 PM  Result Value Ref Range Status   Enterococcus species NOT DETECTED NOT DETECTED Final   Vancomycin resistance NOT DETECTED NOT DETECTED Final   Listeria monocytogenes NOT DETECTED NOT DETECTED Final   Staphylococcus species NOT DETECTED NOT DETECTED Final   Staphylococcus aureus NOT DETECTED NOT DETECTED Final   Methicillin resistance NOT  DETECTED NOT DETECTED Final   Streptococcus species DETECTED (A) NOT DETECTED Final    Comment: CRITICAL RESULT CALLED TO, READ BACK BY AND VERIFIED WITH: G ABBOTT PHARMD 2300 06/02/16 A BROWNING    Streptococcus agalactiae DETECTED (A) NOT DETECTED Final    Comment: CRITICAL RESULT CALLED TO, READ BACK BY AND VERIFIED WITH: G ABBOTT PHARMD 2300 06/02/16 A BROWNING    Streptococcus pneumoniae NOT DETECTED NOT DETECTED Final   Streptococcus pyogenes NOT DETECTED NOT DETECTED Final   Acinetobacter baumannii NOT DETECTED NOT DETECTED Final   Enterobacteriaceae species NOT DETECTED NOT DETECTED Final   Enterobacter cloacae complex NOT DETECTED NOT DETECTED Final   Escherichia coli NOT DETECTED NOT DETECTED Final   Klebsiella oxytoca NOT DETECTED NOT DETECTED Final   Klebsiella pneumoniae NOT DETECTED NOT DETECTED Final   Proteus species NOT DETECTED NOT DETECTED Final   Serratia marcescens NOT DETECTED NOT DETECTED Final   Carbapenem resistance NOT DETECTED NOT DETECTED Final   Haemophilus influenzae NOT DETECTED NOT DETECTED Final   Neisseria meningitidis NOT DETECTED NOT DETECTED Final   Pseudomonas aeruginosa NOT DETECTED NOT DETECTED Final   Candida albicans NOT DETECTED NOT DETECTED Final   Candida glabrata NOT DETECTED NOT DETECTED Final   Candida krusei NOT DETECTED NOT DETECTED Final   Candida parapsilosis NOT DETECTED NOT DETECTED Final   Candida tropicalis NOT DETECTED NOT DETECTED Final    Comment: Performed at [facility]  Blood Culture (routine x 2)     Status: None   Collection Time: 06/01/16  7:54 PM  Result Value Ref Range Status   Specimen Description BLOOD LEFT HAND  Final   Special Requests BOTTLES DRAWN AEROBIC ONLY [employer]  Final   Culture NO GROWTH 5 DAYS  Final   Report Status 06/06/2016 FINAL  Final  Urine culture     Status: Abnormal   Collection Time: 06/01/16 10:01 PM  Result Value Ref Range Status   Specimen Description URINE, CLEAN CATCH  Final    Special Requests NONE  Final   Culture >=100,000 COLONIES/mL ESCHERICHIA COLI (A)  Final   Report Status 06/04/2016 FINAL  Final   Organism ID, Bacteria ESCHERICHIA COLI (A)  Final      Susceptibility   Escherichia coli - MIC*    AMPICILLIN >=32 RESISTANT Resistant     CEFAZOLIN 32 INTERMEDIATE Intermediate     CEFTRIAXONE <=1 SENSITIVE Sensitive     CIPROFLOXACIN <=0.25 SENSITIVE Sensitive     GENTAMICIN <=1 SENSITIVE Sensitive     IMIPENEM <=0.25 SENSITIVE Sensitive     NITROFURANTOIN <=16 SENSITIVE Sensitive     TRIMETH/SULFA <=20 SENSITIVE Sensitive     AMPICILLIN/SULBACTAM >=32 RESISTANT Resistant     PIP/TAZO 8 SENSITIVE Sensitive     Extended ESBL NEGATIVE Sensitive     * >=100,000 COLONIES/mL ESCHERICHIA COLI  MRSA PCR Screening     Status: None   Collection Time: 06/02/16  2:50 AM  Result Value Ref Range Status   MRSA by PCR NEGATIVE NEGATIVE Final  Comment:        The GeneXpert MRSA Assay (FDA approved for NASAL specimens only), is one component of a comprehensive MRSA colonization surveillance program. It is not intended to diagnose MRSA infection nor to guide or monitor treatment for MRSA infections.   Culture, blood (Routine X 2) w Reflex to ID Panel     Status: None   Collection Time: 06/02/16  6:55 PM  Result Value Ref Range Status   Specimen Description BLOOD LEFT HAND  Final   Special Requests BOTTLES DRAWN AEROBIC ONLY 5CC  Final   Culture NO GROWTH 5 DAYS  Final   Report Status 06/07/2016 FINAL  Final  Culture, blood (Routine X 2) w Reflex to ID Panel     Status: None   Collection Time: 06/02/16  7:05 PM  Result Value Ref Range Status   Specimen Description BLOOD LEFT ANTECUBITAL  Final   Special Requests BOTTLES DRAWN AEROBIC AND ANAEROBIC 10 CC EA  Final   Culture NO GROWTH 5 DAYS  Final   Report Status 06/07/2016 FINAL  Final  Surgical PCR screen     Status: Abnormal   Collection Time: 06/08/16 12:34 AM  Result Value Ref Range Status   MRSA,  PCR NEGATIVE NEGATIVE Final   Staphylococcus aureus POSITIVE (A) NEGATIVE Final    Comment:        The Xpert SA Assay (FDA approved for NASAL specimens in patients over 27 years of age), is one component of a comprehensive surveillance program.  Test performance has been validated by [facility] for patients greater than or equal to 39 year old. It is not intended to diagnose infection nor to guide or monitor treatment.   Aerobic/Anaerobic Culture (surgical/deep wound)     Status: None (Preliminary result)   Collection Time: 06/08/16  5:04 PM  Result Value Ref Range Status   Specimen Description KNEE LEFT  Final   Special Requests SYNOVIAL  Final   Gram Stain   Final    RARE WBC PRESENT, PREDOMINANTLY PMN NO ORGANISMS SEEN    Culture NO GROWTH < 24 HOURS  Final   Report Status PENDING  Incomplete      Radiology Studies: Dg Knee Left Port  Result Date: 06/08/2016 CLINICAL DATA:  History of infected left total knee. Excisional total knee arthroplasty with antibiotic spacers. EXAM: PORTABLE LEFT KNEE - 1-2 VIEW COMPARISON:  06/01/2016 FINDINGS: Removal of the knee arthroplasty and placement of antibiotic spacers. Femoral and tibial stems are completely visualized. No evidence for a periprosthetic fracture. There appears to be a surgical drain. Gas in soft tissues related to recent surgery. Normal alignment of the knee. IMPRESSION: Removal of knee arthroplasty and placement of antibiotic spacers. Electronically Signed   By: Markus Daft M.D.   On: 06/08/2016 20:27     Scheduled Meds: . anastrozole  1 mg Oral Daily  . cefTRIAXone (ROCEPHIN)  IV  2 g Intravenous Q24H  . docusate sodium  100 mg Oral BID  . dronedarone  400 mg Oral BID WC  . enoxaparin (LOVENOX) injection  40 mg Subcutaneous Q24H  . furosemide  40 mg Intravenous BID  . gentamicin  60 mg Intravenous Q12H  . Influenza vac split quadrivalent PF  0.5 mL Intramuscular Tomorrow-1000  . insulin aspart  0-5 Units  Subcutaneous QHS  . insulin aspart  0-9 Units Subcutaneous TID WC  . insulin glargine  18 Units Subcutaneous QHS  . losartan  100 mg Oral Daily  . [START ON  06/10/2016] metoprolol succinate  50 mg Oral Daily  . multivitamin  2 tablet Oral Daily  . mupirocin ointment  1 application Nasal BID  . oxybutynin  10 mg Oral QHS  . pantoprazole  40 mg Oral Daily  . saccharomyces boulardii  250 mg Oral BID  . senna  1 tablet Oral BID  . sodium chloride flush  10-40 mL Intracatheter Q12H  . Warfarin - Pharmacist Dosing Inpatient   Does not apply q1800   Continuous Infusions:     LOS: 8 days   Time Spent in minutes   30 minutes  Tammi Boulier D.O. on 06/09/2016 at 11:05 AM  Between 7am to 7pm - Pager - [phone]  After 7pm go to [email or web address] - password [email or web address]  And look for the night coverage person covering for me after hours  [facility] Office  [phone]

## 2016-06-09 NOTE — Op Note (Signed)
NAMEMarland Kitchen  JENNIYAH, JASPERSON NO.:  [id number]  MEDICAL RECORD NO.:  [id number]  LOCATION:  [id number]                        FACILITY:  [facility]  PHYSICIAN:  Rod Can, MD     DATE OF BIRTH:  29-Mar-1936  DATE OF PROCEDURE:  06/08/2016 DATE OF DISCHARGE:                              OPERATIVE REPORT   SURGEON:  Rod Can, MD.  ASSISTANT:  April Green, RNFA.  PREOPERATIVE DIAGNOSIS:  Group B strep, periprosthetic joint infection, left total knee replacement.  POSTOPERATIVE DIAGNOSIS:  Group B strep, periprosthetic joint infection, left total knee replacement.  PROCEDURE PERFORMED:  Resection left total knee arthroplasty with placement of articulating antibiotic spacer.  EXPLANTS:  Stryker Triathlon PS femur size 4, size 4 tibia 9-mm PS bearing.  IMPLANTS:  Biomet stage I articulating spacer 75 tibia, 70 femur with Palacos cement containing 2 g of vancomycin and 2.4 g tobramycin per pack.  ESTIMATED BLOOD LOSS:  350 mL.  SPECIMENS:  Left knee synovial fluid for culture.  COMPLICATIONS:  None.  TUBES AND DRAINS:  Medium Hemovac x1.  DISPOSITION:  Stable to PACU.  ANTIBIOTICS:  Scheduled Rocephin.  INDICATIONS:  The patient is a 80 year old female, who is an uncontrolled diabetic.  She underwent primary left total knee replacement uneventfully 10 years ago in [place].  She was doing very well until about a week ago.  She had increasing left knee pain and swelling.  She went to the emergency department at [facility], where the knee was aspirated and she was subsequently transferred to [facility].  The aspirate grew group B strep.  The patient was medically ill and required IV antibiotics to stabilize her.  She was found to have endocarditis.  She was found to have a mastitis of the breast which subsequently resolved with antibiotics.  She was seen by Infectious Disease.  Given the severity of her illness and poor host, we discussed that it  would be reasonable to proceed with resection arthroplasty with placement of spacer to yield the highest chance of curing the infection.  We discussed the risks, benefits, and alternatives.  She elected to proceed.  DESCRIPTION OF PROCEDURE IN DETAIL:  I identified the patient in the holding area using 2 identifiers.  She was taken to the operating room and general anesthesia was induced.  She was positioned on the operating room table and all bony prominences were well padded.  Foley was placed. Nonsterile tourniquet was applied to the left thigh.  Left lower extremity was prepped and draped in normal sterile surgical fashion. Time-out was called verifying site and side of surgery.  She did receive IV antibiotics within 60 minutes of beginning of the procedure.  I began by using gravity exsanguination, then I elevated the tourniquet to 320 mmHg.  I used a #10 blade to excise her previous scar.  I made a standard medial parapatellar arthrotomy.  She had purulent joint fluid and this was sent for culture.  She did have inflamed and infected looking synovium.  I brought the knee into full extension.  I performed a radical synovectomy of the suprapatellar pouch as well as the medial and lateral  gutters.  I sharply excised the infrapatellar scar.  I then used ACL saw blade to disrupt the implant cement interface of the tibial component and femoral component.  The poly liner was removed with an  osteotome. I then used chisels and extraction device to remove the femur  and the tibia. She had some mild bone loss of the anterior femur and anteromedial tibia.  I performed a complete synovectomy of the posterior compartment of the knee as well.  The knee was brought back into extension and then I used an ACL saw blade to transect the pegs of the patellar component.  I used a burr to completely remove all of the cement and polyethylene from the patellar remnant.  I debrided the bone of the tibia and  femur.  I took a skim cut of the proximal tibia.  I sized the tibia, I sized the femur.  I checked the flexion and extension gap with a spacer block.  The tibial and femoral stage I components were injected onto the back table.  I also made cement dowels using a 36-French chest tube.  Once the cement was hardened, then an additional pack of cement was mixed for cementing the components.  The tourniquet was let down, then I placed the dowels into the medullary canals of the femur and tibia and the components were cemented in place. I brought the knee into extension.  Range of motion was performed while the cement hardened to prevent excessive adherence of spacer.  The motion of the spacer was excellent.  She had excellent varus and valgus balance as well as extension and flexion balance.  I copiously irrigated the wound with dilute Betadine solution followed by 9 L of saline.  I closed the arthrotomy over a medium Hemovac drain with #1 PDS and 0 V- Loc.  Deep dermal layer was closed with 2-0 interrupted Monocryl, skin was closed with 2-0 nylon mattress sutures.  Sterile dressing was applied followed by a bulky dressing and a knee immobilizer.  The patient was then extubated and taken to PACU in stable condition. Sponge, needle, and instrument counts were correct at the end of the case x2.  There were no known complications.  Postoperatively, we will re-admit the patient to the hospitalist.  She will be touchdown weightbearing with a walker.  We will keep her in the immobilizer for 2 weeks for wound healing.  After 2 weeks, we will assess her for suture removal and she may begin range of motion and some weightbearing with a walker.  Family indicates that they prefer her to be transferred to [place], where they reside, after this hospital stay and they will find an orthopedist locally.          ______________________________ Rod Can, MD     BS/MEDQ  D:  06/08/2016  T:   06/09/2016  Job:  [id number]

## 2016-06-09 NOTE — Progress Notes (Addendum)
Discussed with RN CM pt's plan for rehab in [place] with her complex medical issues. I have asked if family would like to have pt assessed for an inpt rehab admission at [facility] and then discharge to [place] where she is from once completed rehab course. I await clarification. [id number]

## 2016-06-09 NOTE — Evaluation (Signed)
Physical Therapy Evaluation Patient Details Name: [place] MRN: [id number] DOB: 02-14-1936 Today's Date: 06/09/2016   History of Present Illness  Toni H[place] is a 80 y.o. female with medical history significant of breast cancer, history of right breast lumpectomy, CAD, type 2 diabetes, hypertension who is coming to the emergency department due to left knee pain.  Found to have strep bacteremia and endocarditis.  Now s/p resection arthroplasty L TKA and insertion of antibiotic spacer.  Clinical Impression  Patient presents with decreased independence with mobility due to deficits listed in PT problem list.  She will benefit from skilled PT in the acute setting to allow return home following SNF level rehab stay.      Follow Up Recommendations SNF (in [place])    Equipment Recommendations  Other (comment) (TBA at next venue)    Recommendations for Other Services       Precautions / Restrictions Precautions Precautions: Fall Required Braces or Orthoses: Knee Immobilizer - Left Restrictions Weight Bearing Restrictions: Yes LLE Weight Bearing: Touchdown weight bearing      Mobility  Bed Mobility Overal bed mobility: Needs Assistance Bed Mobility: Supine to Sit     Supine to sit: Mod assist     General bed mobility comments: pt cued for technique to scoot hips, assisted with L LE and lifting trunk  Transfers Overall transfer level: Needs assistance Equipment used: Rolling walker (2 wheeled) Transfers: Sit to/from Stand Sit to Stand: Mod assist;+2 physical assistance         General transfer comment: assist for balance, for TDWB L LE, cues for hand placement  Ambulation/Gait Ambulation/Gait assistance: Mod assist;+2 safety/equipment Ambulation Distance (Feet): 3 Feet Assistive device: Rolling walker (2 wheeled) Gait Pattern/deviations: Step-to pattern;Decreased stride length;Shuffle     General Gait Details: assist for balance, for unweighting L LE and pt able to  maintain touch down, max cues for technique  Stairs            Wheelchair Mobility    Modified Rankin (Stroke Patients Only)       Balance Overall balance assessment: Needs assistance Sitting-balance support: Feet supported Sitting balance-Leahy Scale: Fair       Standing balance-Leahy Scale: Poor Standing balance comment: UE support and assist for balance with limited weight bearing on L LE                             Pertinent Vitals/Pain Pain Assessment: 0-10 Pain Score: 2  Pain Location: at rest in L knee, much higher with mobility, but unrated Pain Descriptors / Indicators: Aching;Grimacing;Operative site guarding Pain Intervention(s): Monitored during session;Premedicated before session;Repositioned    Home Living Family/patient expects to be discharged to:: Skilled nursing facility                 Additional Comments: lives in [place], [place] and plans to return there for rehab    Prior Function Level of Independence: Independent         Comments: was independent prior to fall 10/2     Hand Dominance   Dominant Hand: Right    Extremity/Trunk Assessment   Upper Extremity Assessment: Overall WFL for tasks assessed           Lower Extremity Assessment: LLE deficits/detail   LLE Deficits / Details: ankle AROM WFL, otherwise NT due to immobilization, able to perform quad sets     Communication   Communication: No difficulties  Cognition Arousal/Alertness:  Awake/alert Behavior During Therapy: WFL for tasks assessed/performed Overall Cognitive Status: Within Functional Limits for tasks assessed                      General Comments      Exercises Total Joint Exercises Ankle Circles/Pumps: AROM;Both;10 reps;Supine Quad Sets: AROM;Left;10 reps;Supine   Assessment/Plan    PT Assessment Patient needs continued PT services  PT Problem List Decreased strength;Decreased balance;Decreased activity tolerance;Decreased  range of motion;Decreased mobility;Decreased knowledge of use of DME;Pain;Cardiopulmonary status limiting activity;Decreased knowledge of precautions          PT Treatment Interventions DME instruction;Gait training;Balance training;Therapeutic exercise;Therapeutic activities;Functional mobility training;Patient/family education    PT Goals (Current goals can be found in the Care Plan section)  Acute Rehab PT Goals Patient Stated Goal: To go to rehab in [place] PT Goal Formulation: With patient/family Time For Goal Achievement: 06/23/16 Potential to Achieve Goals: Good    Frequency Min 3X/week   Barriers to discharge        Co-evaluation               End of Session Equipment Utilized During Treatment: Gait belt;Left knee immobilizer Activity Tolerance: Patient limited by fatigue;Patient limited by pain Patient left: in chair;with call bell/phone within reach;with family/visitor present;with nursing/sitter in room           Time: 1000-1028 PT Time Calculation (min) (ACUTE ONLY): 28 min   Charges:   PT Evaluation $PT Eval Moderate Complexity: 1 Procedure PT Treatments $Therapeutic Activity: 8-22 mins   PT G CodesReginia Naas 06-11-16, 12:03 PM  Magda Kiel, [place] 06/11/16

## 2016-06-09 NOTE — NC FL2 (Signed)
[place] LEVEL OF CARE SCREENING TOOL     IDENTIFICATION  Patient Name: [place] Birthdate: 07/10/1936 Sex: female Admission Date (Current Location): 06/01/2016  [place] and [place] Number:   [place], [place])   Facility and Address:  [facility], [place] [address] [place], [place] [address]      Provider Number: [id number]  Attending Physician Name and Address:  Cristal Ford, DO  Relative Name and Phone Number:       Current Level of Care: Hospital Recommended Level of Care: [place] Prior Approval Number:    Date Approved/Denied:   PASRR Number:   [id number]   Discharge Plan: Home    Current Diagnoses: Patient Active Problem List   Diagnosis Date Noted  . Infection of total knee replacement ([place]) 06/08/2016  . Wheezing   . Hyperglycemia   . Acute bacterial endocarditis   . Prosthetic joint infection, subsequent encounter   . Group B streptococcal bacteriuria   . Group B streptococcal infection   . Sepsis due to group B Streptococcus ([place])   . Acute on chronic combined systolic and diastolic congestive heart failure ([place])   . Septic arthritis ([place]) 06/01/2016  . Pyogenic arthritis of left knee joint ([place]) 06/01/2016  . Uncontrolled type 2 diabetes mellitus ([place]) 06/01/2016  . CAD (coronary artery disease) 06/01/2016  . Hypertension 06/01/2016  . UTI (urinary tract infection) 06/01/2016  . Hypophosphatemia 06/01/2016  . History of breast cancer 06/01/2016  . Sepsis ([place]) 06/01/2016  . Cellulitis of breast 06/01/2016    Orientation RESPIRATION BLADDER Height & Weight     Self, Time, Situation, Place  O2 () Indwelling catheter, Incontinent Weight: 182 lb (82.6 kg) Height:  5\' 3"  (160 cm)  BEHAVIORAL SYMPTOMS/MOOD NEUROLOGICAL BOWEL NUTRITION STATUS      Continent Diet (see DC summary)  AMBULATORY STATUS COMMUNICATION OF NEEDS Skin   Extensive Assist   Surgical wounds                        Personal Care Assistance Level of Assistance  Bathing, Dressing Bathing Assistance: Maximum assistance   Dressing Assistance: Maximum assistance     Functional Limitations Info             SPECIAL CARE FACTORS FREQUENCY  PT (By licensed PT), OT (By licensed OT)     PT Frequency: 5/wk OT Frequency: 5/wk            Contractures      Additional Factors Info  Code Status, Allergies, Insulin Sliding Scale Code Status Info: FULL Allergies Info: Amoxicillin, Penicillins   Insulin Sliding Scale Info: 5/day       Current Medications (06/09/2016):  This is the current hospital active medication list Current Facility-Administered Medications  Medication Dose Route Frequency Provider Last Rate Last Dose  . acetaminophen (TYLENOL) tablet 650 mg  650 mg Oral Q6H PRN Rod Can, MD       Or  . acetaminophen (TYLENOL) suppository 650 mg  650 mg Rectal Q6H PRN Rod Can, MD      . anastrozole (ARIMIDEX) tablet 1 mg  1 mg Oral Daily Reubin Milan, MD   1 mg at 06/09/16 0910  . bisacodyl (DULCOLAX) suppository 10 mg  10 mg Rectal Daily PRN Rod Can, MD      . cefTRIAXone (ROCEPHIN) 2 g in dextrose 5 % 50 mL IVPB  2 g Intravenous Q24H Truman Hayward, MD  2 g at 06/07/16 1753  . dextrose 50 % solution 25 mL  25 mL Intravenous PRN Reubin Milan, MD      . docusate sodium (COLACE) capsule 100 mg  100 mg Oral BID Rod Can, MD   100 mg at 06/09/16 0910  . dronedarone (MULTAQ) tablet 400 mg  400 mg Oral BID WC Reubin Milan, MD   400 mg at 06/09/16 1213  . enoxaparin (LOVENOX) injection 40 mg  40 mg Subcutaneous Q24H Rod Can, MD   40 mg at 06/09/16 0909  . furosemide (LASIX) injection 40 mg  40 mg Intravenous BID Pixie Casino, MD   40 mg at 06/09/16 0909  . gentamicin (GARAMYCIN) IVPB 60 mg  60 mg Intravenous Q12H Truman Hayward, MD   60 mg at 06/09/16 [id number]  . HYDROcodone-acetaminophen (NORCO/VICODIN) 5-325 MG per tablet 1-2 tablet   1-2 tablet Oral Q4H PRN Rod Can, MD   2 tablet at 06/09/16 1247  . Influenza vac split quadrivalent PF (FLUARIX) injection 0.5 mL  0.5 mL Intramuscular Tomorrow-1000 Ivor Costa, MD      . insulin aspart (novoLOG) injection 0-5 Units  0-5 Units Subcutaneous QHS Shon Millet, DO   2 Units at 06/05/16 2257  . insulin aspart (novoLOG) injection 0-9 Units  0-9 Units Subcutaneous TID WC Maryann Mikhail, DO   3 Units at 06/09/16 1212  . insulin glargine (LANTUS) injection 18 Units  18 Units Subcutaneous QHS Maryann Mikhail, DO   18 Units at 06/08/16 2322  . loperamide (IMODIUM) capsule 2 mg  2 mg Oral PRN Maryann Mikhail, DO   2 mg at 06/07/16 1643  . losartan (COZAAR) tablet 100 mg  100 mg Oral Daily Rod Can, MD   100 mg at 06/09/16 0910  . metoCLOPramide (REGLAN) tablet 5-10 mg  5-10 mg Oral Q8H PRN Rod Can, MD       Or  . metoCLOPramide (REGLAN) injection 5-10 mg  5-10 mg Intravenous Q8H PRN Rod Can, MD      . Derrill Memo ON 06/10/2016] metoprolol succinate (TOPROL-XL) 24 hr tablet 50 mg  50 mg Oral Daily Minus Breeding, MD      . morphine 2 MG/ML injection 1 mg  1 mg Intravenous Q2H PRN Rod Can, MD   1 mg at 06/09/16 0219  . multivitamin (PROSIGHT) tablet 2 tablet  2 tablet Oral Daily Maryann Mikhail, DO   2 tablet at 06/09/16 0911  . mupirocin ointment (BACTROBAN) 2 % 1 application  1 application Nasal BID Cristal Ford, DO   1 application at [id number]  . ondansetron (ZOFRAN) tablet 4 mg  4 mg Oral Q6H PRN Rod Can, MD       Or  . ondansetron [facility]) injection 4 mg  4 mg Intravenous Q6H PRN Rod Can, MD      . oxybutynin (DITROPAN-XL) 24 hr tablet 10 mg  10 mg Oral QHS Reubin Milan, MD   10 mg at 06/08/16 2234  . pantoprazole (PROTONIX) EC tablet 40 mg  40 mg Oral Daily Reubin Milan, MD   40 mg at 06/09/16 0910  . polyethylene glycol (MIRALAX / GLYCOLAX) packet 17 g  17 g Oral Daily PRN Rod Can, MD      . saccharomyces  boulardii (FLORASTOR) capsule 250 mg  250 mg Oral BID Maryann Mikhail, DO   250 mg at 06/09/16 0910  . senna (SENOKOT) tablet 8.6 mg  1 tablet Oral BID Rod Can, MD  8.6 mg at 06/08/16 2230  . sodium chloride flush (NS) 0.9 % injection 10-40 mL  10-40 mL Intracatheter Q12H Maryann Mikhail, DO   40 mL at 06/08/16 2230  . sodium phosphate (FLEET) 7-19 GM/118ML enema 1 enema  1 enema Rectal Once PRN Rod Can, MD      . warfarin (COUMADIN) tablet 5 mg  5 mg Oral ONCE-1800 Wynell Balloon, RPH      . Warfarin - Pharmacist Dosing Inpatient   Does not apply q1800 Kris Mouton, RPH      . zolpidem [facility]) tablet 5 mg  5 mg Oral QHS PRN Reubin Milan, MD   5 mg at 06/07/16 2205     Discharge Medications: Please see discharge summary for a list of discharge medications.  Relevant Imaging Results:  Relevant Lab Results:   Additional Information SS#: [id number]  Jorge Ny, LCSW

## 2016-06-09 NOTE — Progress Notes (Addendum)
Subjective:  C.o pain at operative site   Antibiotics:  Anti-infectives    Start     Dose/Rate Route Frequency Ordered Stop   06/08/16 1718  tobramycin (NEBCIN) powder  Status:  Discontinued       As needed 06/08/16 1718 06/08/16 1921   06/08/16 1717  vancomycin (VANCOCIN) powder  Status:  Discontinued       As needed 06/08/16 1717 06/08/16 1921   06/08/16 1630  cefTRIAXone (ROCEPHIN) 2 g in dextrose 5 % 50 mL IVPB     2 g 100 mL/hr over 30 Minutes Intravenous To Surgery 06/08/16 1628 06/08/16 1700   06/05/16 0600  vancomycin (VANCOCIN) IVPB 1000 mg/200 mL premix     1,000 mg 200 mL/hr over 60 Minutes Intravenous On call to O.R. 06/04/16 1736 06/05/16 0646   06/04/16 2200  gentamicin (GARAMYCIN) IVPB 60 mg     60 mg 100 mL/hr over 30 Minutes Intravenous Every 12 hours 06/04/16 1558     06/03/16 1800  cefTRIAXone (ROCEPHIN) 2 g in dextrose 5 % 50 mL IVPB     2 g 100 mL/hr over 30 Minutes Intravenous Every 24 hours 06/03/16 1613     06/03/16 1230  ceFAZolin (ANCEF) IVPB 2g/100 mL premix  Status:  Discontinued     2 g 200 mL/hr over 30 Minutes Intravenous Every 8 hours 06/03/16 1132 06/03/16 1613   06/02/16 1800  vancomycin (VANCOCIN) 1,250 mg in sodium chloride 0.9 % 250 mL IVPB  Status:  Discontinued     1,250 mg 166.7 mL/hr over 90 Minutes Intravenous Every 24 hours 06/01/16 1910 06/03/16 1132   06/02/16 0300  aztreonam (AZACTAM) 2 g in dextrose 5 % 50 mL IVPB  Status:  Discontinued     2 g 100 mL/hr over 30 Minutes Intravenous Every 8 hours 06/01/16 1910 06/02/16 1711   06/01/16 1915  vancomycin (VANCOCIN) 1,500 mg in sodium chloride 0.9 % 500 mL IVPB     1,500 mg 250 mL/hr over 120 Minutes Intravenous  Once 06/01/16 1901 06/01/16 2249   06/01/16 1900  aztreonam (AZACTAM) 2 g in dextrose 5 % 50 mL IVPB     2 g 100 mL/hr over 30 Minutes Intravenous  Once 06/01/16 1850 06/01/16 2035   06/01/16 1900  vancomycin (VANCOCIN) IVPB 1000 mg/200 mL premix  Status:   Discontinued     1,000 mg 200 mL/hr over 60 Minutes Intravenous  Once 06/01/16 1850 06/01/16 1901      Medications: Scheduled Meds: . anastrozole  1 mg Oral Daily  . cefTRIAXone (ROCEPHIN)  IV  2 g Intravenous Q24H  . docusate sodium  100 mg Oral BID  . dronedarone  400 mg Oral BID WC  . enoxaparin (LOVENOX) injection  40 mg Subcutaneous Q24H  . furosemide  40 mg Intravenous BID  . gentamicin  60 mg Intravenous Q12H  . Influenza vac split quadrivalent PF  0.5 mL Intramuscular Tomorrow-1000  . insulin aspart  0-5 Units Subcutaneous QHS  . insulin aspart  0-9 Units Subcutaneous TID WC  . insulin glargine  18 Units Subcutaneous QHS  . losartan  100 mg Oral Daily  . [START ON 06/10/2016] metoprolol succinate  50 mg Oral Daily  . multivitamin  2 tablet Oral Daily  . mupirocin ointment  1 application Nasal BID  . oxybutynin  10 mg Oral QHS  . pantoprazole  40 mg Oral Daily  . saccharomyces boulardii  250 mg Oral BID  .  senna  1 tablet Oral BID  . sodium chloride flush  10-40 mL Intracatheter Q12H  . warfarin  5 mg Oral ONCE-1800  . Warfarin - Pharmacist Dosing Inpatient   Does not apply q1800   Continuous Infusions:   PRN Meds:.acetaminophen **OR** acetaminophen, bisacodyl, dextrose, HYDROcodone-acetaminophen, loperamide, metoCLOPramide **OR** metoCLOPramide (REGLAN) injection, morphine injection, ondansetron **OR** ondansetron (ZOFRAN) IV, polyethylene glycol, sodium phosphate, zolpidem    Objective: Weight change:   Intake/Output Summary (Last 24 hours) at 06/09/16 1439 Last data filed at 06/09/16 1157  Gross per 24 hour  Intake          3531.67 ml  Output             3350 ml  Net           181.67 ml   Blood pressure (!) 163/70, pulse 97, temperature 98.9 F (37.2 C), temperature source Oral, resp. rate (!) 25, height 5' 3"  (1.6 m), weight 182 lb (82.6 kg), SpO2 99 %. Temp:  [97 F (36.1 C)-99.4 F (37.4 C)] 98.9 F (37.2 C) (10/10 1100) Pulse Rate:  [79-116] 97  (10/10 1300) Resp:  [13-30] 25 (10/10 1300) BP: (123-187)/(60-97) 163/70 (10/10 1300) SpO2:  [94 %-100 %] 99 % (10/10 1300)  Physical Exam: General: Alert and awake, oriented x3, not in any acute distress. General: Alert and awake, oriented x3, not in any acute distress. HEENT: anicteric sclera,  EOMI, oropharynx clear and without exudate Cardiovascular: regular rate, normal r,  no murmur rubs or gallops Pulmonary: clear to auscultation bilaterally, no wheezing, rales or rhonchi Gastrointestinal: soft nontender, nondistended, normal bowel sounds, Musculoskeletal: LE wrapped in bandage Neuro: nonfocal  CBC:  CBC Latest Ref Rng & Units 06/09/2016 06/08/2016 06/07/2016  WBC 4.0 - 10.5 K/uL 7.1 6.9 6.2  Hemoglobin 12.0 - 15.0 g/dL 8.4(L) 10.0(L) 9.9(L)  Hematocrit 36.0 - 46.0 % 26.7(L) 31.1(L) 31.0(L)  Platelets 150 - 400 K/uL 175 190 187      BMET  Recent Labs  06/08/16 0637 06/09/16 0307  NA 137 137  K 3.5 3.7  CL 102 100*  CO2 27 27  GLUCOSE 146* 191*  BUN <5* <5*  CREATININE 0.57 0.64  CALCIUM 8.2* 8.1*     Liver Panel  No results for input(s): PROT, ALBUMIN, AST, ALT, ALKPHOS, BILITOT, BILIDIR, IBILI in the last 72 hours.     Sedimentation Rate No results for input(s): ESRSEDRATE in the last 72 hours. C-Reactive Protein No results for input(s): CRP in the last 72 hours.  Micro Results: Recent Results (from the past 720 hour(s))  Gram stain     Status: None   Collection Time: 06/01/16  4:50 PM  Result Value Ref Range Status   Specimen Description FLUID SYNOVIAL LEFT KNEE  Final   Special Requests NONE  Final   Gram Stain   Final    ABUNDANT WBC PRESENT, PREDOMINANTLY PMN RARE INTRACELLULAR GRAM POSITIVE COCCI Gram Stain Report Called to,Read Back By and Verified With: EDWARDS,C. AT 1804 ON 06/01/2016 BY BAUGHAM,M.    Report Status 06/01/2016 FINAL  Final  Culture, body fluid-bottle     Status: Abnormal   Collection Time: 06/01/16  4:50 PM  Result  Value Ref Range Status   Specimen Description FLUID SYNOVIAL LEFT KNEE  Final   Special Requests BOTTLES DRAWN AEROBIC AND ANAEROBIC 3CC EACH  Final   Gram Stain   Final    GRAM POSITIVE COCCI IN CHAINS IN BOTH AEROBIC AND ANAEROBIC BOTTLES Gram Stain Report Called to,Read  Back By and Verified With: REAP B AT [place] [id number] BY FORSYTH K Performed at [place] B STREP(S.AGALACTIAE)ISOLATED (A)  Final   Report Status 06/04/2016 FINAL  Final   Organism ID, Bacteria GROUP B STREP(S.AGALACTIAE)ISOLATED  Final      Susceptibility   Group b strep(s.agalactiae)isolated - MIC*    CLINDAMYCIN >=1 RESISTANT Resistant     AMPICILLIN <=0.25 SENSITIVE Sensitive     ERYTHROMYCIN >=8 RESISTANT Resistant     VANCOMYCIN 0.5 SENSITIVE Sensitive     CEFTRIAXONE <=0.12 SENSITIVE Sensitive     LEVOFLOXACIN 1 SENSITIVE Sensitive     * GROUP B STREP(S.AGALACTIAE)ISOLATED  Blood Culture (routine x 2)     Status: Abnormal   Collection Time: 06/01/16  7:42 PM  Result Value Ref Range Status   Specimen Description BLOOD LEFT HAND  Final   Special Requests BOTTLES DRAWN AEROBIC ONLY [place]  Final   Culture  Setup Time   Final    GRAM POSITIVE COCCI IN CHAINS RECOVERED FROM THE AEROBIC BOTTLE Gram Stain Report Called to,Read Back By and Verified With: AGUIRRE,A. AT 4627 ON 06/02/2016 BY BAUGHAM,M. Performed at [facility] ID to follow CRITICAL RESULT CALLED TO, READ BACK BY AND VERIFIED WITH: G ABBOTT PHARMD 2300 06/02/16 A BROWNING Performed at [place] B STREP(S.AGALACTIAE)ISOLATED (A)  Final   Report Status 06/04/2016 FINAL  Final   Organism ID, Bacteria GROUP B STREP(S.AGALACTIAE)ISOLATED  Final      Susceptibility   Group b strep(s.agalactiae)isolated - MIC*    CLINDAMYCIN >=1 RESISTANT Resistant     AMPICILLIN <=0.25 SENSITIVE Sensitive     ERYTHROMYCIN >=8 RESISTANT Resistant     VANCOMYCIN 0.5 SENSITIVE Sensitive      CEFTRIAXONE <=0.12 SENSITIVE Sensitive     LEVOFLOXACIN 1 SENSITIVE Sensitive     * GROUP B STREP(S.AGALACTIAE)ISOLATED  Blood Culture ID Panel (Reflexed)     Status: Abnormal   Collection Time: 06/01/16  7:42 PM  Result Value Ref Range Status   Enterococcus species NOT DETECTED NOT DETECTED Final   Vancomycin resistance NOT DETECTED NOT DETECTED Final   Listeria monocytogenes NOT DETECTED NOT DETECTED Final   Staphylococcus species NOT DETECTED NOT DETECTED Final   Staphylococcus aureus NOT DETECTED NOT DETECTED Final   Methicillin resistance NOT DETECTED NOT DETECTED Final   Streptococcus species DETECTED (A) NOT DETECTED Final    Comment: CRITICAL RESULT CALLED TO, READ BACK BY AND VERIFIED WITH: G ABBOTT PHARMD 2300 06/02/16 A BROWNING    Streptococcus agalactiae DETECTED (A) NOT DETECTED Final    Comment: CRITICAL RESULT CALLED TO, READ BACK BY AND VERIFIED WITH: G ABBOTT PHARMD 2300 06/02/16 A BROWNING    Streptococcus pneumoniae NOT DETECTED NOT DETECTED Final   Streptococcus pyogenes NOT DETECTED NOT DETECTED Final   Acinetobacter baumannii NOT DETECTED NOT DETECTED Final   Enterobacteriaceae species NOT DETECTED NOT DETECTED Final   Enterobacter cloacae complex NOT DETECTED NOT DETECTED Final   Escherichia coli NOT DETECTED NOT DETECTED Final   Klebsiella oxytoca NOT DETECTED NOT DETECTED Final   Klebsiella pneumoniae NOT DETECTED NOT DETECTED Final   Proteus species NOT DETECTED NOT DETECTED Final   Serratia marcescens NOT DETECTED NOT DETECTED Final   Carbapenem resistance NOT DETECTED NOT DETECTED Final   Haemophilus influenzae NOT DETECTED NOT DETECTED Final   Neisseria meningitidis NOT DETECTED NOT DETECTED Final   Pseudomonas aeruginosa NOT DETECTED NOT DETECTED Final  Candida albicans NOT DETECTED NOT DETECTED Final   Candida glabrata NOT DETECTED NOT DETECTED Final   Candida krusei NOT DETECTED NOT DETECTED Final   Candida parapsilosis NOT DETECTED NOT DETECTED  Final   Candida tropicalis NOT DETECTED NOT DETECTED Final    Comment: Performed at [facility]  Blood Culture (routine x 2)     Status: None   Collection Time: 06/01/16  7:54 PM  Result Value Ref Range Status   Specimen Description BLOOD LEFT HAND  Final   Special Requests BOTTLES DRAWN AEROBIC ONLY [place]  Final   Culture NO GROWTH 5 DAYS  Final   Report Status 06/06/2016 FINAL  Final  Urine culture     Status: Abnormal   Collection Time: 06/01/16 10:01 PM  Result Value Ref Range Status   Specimen Description URINE, CLEAN CATCH  Final   Special Requests NONE  Final   Culture >=100,000 COLONIES/mL ESCHERICHIA COLI (A)  Final   Report Status 06/04/2016 FINAL  Final   Organism ID, Bacteria ESCHERICHIA COLI (A)  Final      Susceptibility   Escherichia coli - MIC*    AMPICILLIN >=32 RESISTANT Resistant     CEFAZOLIN 32 INTERMEDIATE Intermediate     CEFTRIAXONE <=1 SENSITIVE Sensitive     CIPROFLOXACIN <=0.25 SENSITIVE Sensitive     GENTAMICIN <=1 SENSITIVE Sensitive     IMIPENEM <=0.25 SENSITIVE Sensitive     NITROFURANTOIN <=16 SENSITIVE Sensitive     TRIMETH/SULFA <=20 SENSITIVE Sensitive     AMPICILLIN/SULBACTAM >=32 RESISTANT Resistant     PIP/TAZO 8 SENSITIVE Sensitive     Extended ESBL NEGATIVE Sensitive     * >=100,000 COLONIES/mL ESCHERICHIA COLI  MRSA PCR Screening     Status: None   Collection Time: 06/02/16  2:50 AM  Result Value Ref Range Status   MRSA by PCR NEGATIVE NEGATIVE Final    Comment:        The GeneXpert MRSA Assay (FDA approved for NASAL specimens only), is one component of a comprehensive MRSA colonization surveillance program. It is not intended to diagnose MRSA infection nor to guide or monitor treatment for MRSA infections.   Culture, blood (Routine X 2) w Reflex to ID Panel     Status: None   Collection Time: 06/02/16  6:55 PM  Result Value Ref Range Status   Specimen Description BLOOD LEFT HAND  Final   Special Requests BOTTLES DRAWN  AEROBIC ONLY 5CC  Final   Culture NO GROWTH 5 DAYS  Final   Report Status 06/07/2016 FINAL  Final  Culture, blood (Routine X 2) w Reflex to ID Panel     Status: None   Collection Time: 06/02/16  7:05 PM  Result Value Ref Range Status   Specimen Description BLOOD LEFT ANTECUBITAL  Final   Special Requests BOTTLES DRAWN AEROBIC AND ANAEROBIC 10 CC EA  Final   Culture NO GROWTH 5 DAYS  Final   Report Status 06/07/2016 FINAL  Final  Surgical PCR screen     Status: Abnormal   Collection Time: 06/08/16 12:34 AM  Result Value Ref Range Status   MRSA, PCR NEGATIVE NEGATIVE Final   Staphylococcus aureus POSITIVE (A) NEGATIVE Final    Comment:        The Xpert SA Assay (FDA approved for NASAL specimens in patients over 39 years of age), is one component of a comprehensive surveillance program.  Test performance has been validated by [facility] for patients greater than or equal to  58 year old. It is not intended to diagnose infection nor to guide or monitor treatment.   Aerobic/Anaerobic Culture (surgical/deep wound)     Status: None (Preliminary result)   Collection Time: 06/08/16  5:04 PM  Result Value Ref Range Status   Specimen Description KNEE LEFT  Final   Special Requests SYNOVIAL  Final   Gram Stain   Final    RARE WBC PRESENT, PREDOMINANTLY PMN NO ORGANISMS SEEN    Culture NO GROWTH < 24 HOURS  Final   Report Status PENDING  Incomplete    Studies/Results: Dg Knee Left Port  Result Date: 06/08/2016 CLINICAL DATA:  History of infected left total knee. Excisional total knee arthroplasty with antibiotic spacers. EXAM: PORTABLE LEFT KNEE - 1-2 VIEW COMPARISON:  06/01/2016 FINDINGS: Removal of the knee arthroplasty and placement of antibiotic spacers. Femoral and tibial stems are completely visualized. No evidence for a periprosthetic fracture. There appears to be a surgical drain. Gas in soft tissues related to recent surgery. Normal alignment of the knee. IMPRESSION: Removal  of knee arthroplasty and placement of antibiotic spacers. Electronically Signed   By: Markus Daft M.D.   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) Active Problems:   Uncontrolled type 2 diabetes mellitus (HCC)   CAD (coronary artery disease)   Hypertension   UTI (urinary tract infection)   Hypophosphatemia   History of breast cancer   Sepsis ([place])   Cellulitis of breast   Group B streptococcal bacteriuria   Group B streptococcal infection   Sepsis due to group B Streptococcus (HCC)   Acute on chronic combined systolic and diastolic congestive heart failure (HCC)   Acute bacterial endocarditis   Prosthetic joint infection, subsequent encounter   Hyperglycemia   Wheezing   Infection of total knee replacement (HCC)    Toni Hanna is a 80 y.o. 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 Marlou Porch, Dr Stanford Breed and Dr Noralyn Pick critical help with this patient   REMOVAL OF PROSTHESIS with I and D don yesterday  We will plan on 6 weeks of IV ceftriaxone and 2 weeks of IV gentamicin with day #1 being Saturday  She will need her GFR followed clearly and high vigilance for hearing loss on gent  We have stopped the ARB and would be VERY CAUTIOUS with diuretics   Plan for IV antibiotics is as follows  Diagnosis: Native valve endocarditis and PJI  Culture Result: GBS  Allergies  Allergen Reactions  . Amoxicillin Swelling  . Penicillins Swelling    Has patient  had a PCN reaction causing immediate rash, facial/tongue/throat swelling, SOB or lightheadedness with hypotension: Yes Has patient had a PCN reaction causing severe rash involving mucus membranes or skin necrosis: No Has patient had a PCN reaction that required hospitalization No Has patient had a PCN reaction occurring within the last 10 years: No If all of the above answers are "NO", then may proceed with Cephalosporin use.  Tolerated ceftriaxone  Discharge antibiotics: Rocephin 2 grams daily x 6 weeks and Gentamicin x 2 weeks  Per pharmacy protocol Gentamicin  Duration:  Rocephin x 6 weeks  Gentamicin x 2 weeks   End Date:  Rocephin is November 18 th  Gentamicin is October 21 th, 2017   [facility] Care Per Protocol:  BIWEEKLY LABS  __ BMP  weekly while on IV antibiotics: _x_ CBC with differential _x_ CRP _x_ ESR _x_ Gentamicin levels  _x_ Please pull PIC at completion of IV antibiotics __ Please leave PIC in place until doctor has seen patient or been notified  Fax weekly labs to [phone]  Clinic Follow Up Appt:  I am happy to see her here in [place] but she wishes to transfer care to [place]. She will need Home Health agency there and ID MD and Surgeon to receive her. We can monitor labs in the meantime.  I think idea of DC to rehab might be good temporary measure given risk to kidneys during first 2 weeks of her therapy  I will sign off for now  Please call with further questions.    LOS: 8 days   Alcide Evener 06/09/2016, 2:39 PM

## 2016-06-09 NOTE — Progress Notes (Signed)
OT Cancellation Note  Patient Details Name: Toni Hanna MRN: [id number] DOB: [birth year]   Cancelled Treatment:    Reason Eval/Treat Not Completed: Other (comment). Pt in middle of moving from one unit to another. Will try back at later time for evaluation.  Almon Register [id number] 06/09/2016, 12:26 PM

## 2016-06-10 ENCOUNTER — Inpatient Hospital Stay (HOSPITAL_COMMUNITY): Payer: Medicare Other

## 2016-06-10 LAB — BASIC METABOLIC PANEL
ANION GAP: 9 (ref 5–15)
BUN: 6 mg/dL (ref 6–20)
CHLORIDE: 97 mmol/L — AB (ref 101–111)
CO2: 30 mmol/L (ref 22–32)
Calcium: 8.2 mg/dL — ABNORMAL LOW (ref 8.9–10.3)
Creatinine, Ser: 0.56 mg/dL (ref 0.44–1.00)
GFR calc Af Amer: >60 mL/min (ref >60)
GLUCOSE: 187 mg/dL — AB (ref 65–99)
POTASSIUM: 3 mmol/L — AB (ref 3.5–5.1)
Sodium: 136 mmol/L (ref 135–145)

## 2016-06-10 LAB — CBC
HEMATOCRIT: 27.4 % — AB (ref 36.0–46.0)
HEMOGLOBIN: 8.8 g/dL — AB (ref 12.0–15.0)
MCH: 27.8 pg (ref 26.0–34.0)
MCHC: 32.1 g/dL (ref 30.0–36.0)
MCV: 86.7 fL (ref 78.0–100.0)
PLATELETS: 205 10*3/uL (ref 150–400)
RBC: 3.16 MIL/uL — AB (ref 3.87–5.11)
RDW: 15.7 % — ABNORMAL HIGH (ref 11.5–15.5)
WBC: 8.7 10*3/uL (ref 4.0–10.5)

## 2016-06-10 LAB — GLUCOSE, CAPILLARY
GLUCOSE-CAPILLARY: 224 mg/dL — AB (ref 65–99)
Glucose-Capillary: 190 mg/dL — ABNORMAL HIGH (ref 65–99)
Glucose-Capillary: 226 mg/dL — ABNORMAL HIGH (ref 65–99)
Glucose-Capillary: 177 mg/dL — ABNORMAL HIGH (ref 65–99)

## 2016-06-10 LAB — PROTIME-INR
INR: 2.42
Prothrombin Time: 26.8 seconds — ABNORMAL HIGH (ref 11.4–15.2)

## 2016-06-10 LAB — GENTAMICIN LEVEL, TROUGH: Gentamicin Trough: <0.5 ug/mL — ABNORMAL LOW (ref 0.5–2.0)

## 2016-06-10 LAB — GENTAMICIN LEVEL, PEAK: Gentamicin Pk: 2.6 ug/mL — ABNORMAL LOW (ref 5.0–10.0)

## 2016-06-10 MED ORDER — POTASSIUM CHLORIDE CRYS ER 20 MEQ PO TBCR: 40.0000 meq | EXTENDED_RELEASE_TABLET | Freq: Once | ORAL | Status: DC

## 2016-06-10 MED ORDER — WARFARIN SODIUM 5 MG PO TABS
2.5000 mg | ORAL_TABLET | Freq: Once | ORAL | Status: AC
  Administered 2016-06-10: 2.5 mg via ORAL
  Filled 2016-06-10: qty 1

## 2016-06-10 MED ORDER — POTASSIUM CHLORIDE CRYS ER 20 MEQ PO TBCR
20.0000 meq | EXTENDED_RELEASE_TABLET | Freq: Once | ORAL | Status: AC
  Administered 2016-06-10: 20 meq via ORAL
  Filled 2016-06-10: qty 1

## 2016-06-10 MED ORDER — POTASSIUM CHLORIDE CRYS ER 20 MEQ PO TBCR
40.0000 meq | EXTENDED_RELEASE_TABLET | ORAL | Status: AC
  Administered 2016-06-10 (×2): 40 meq via ORAL
  Filled 2016-06-10 (×2): qty 2

## 2016-06-10 MED ORDER — METOPROLOL SUCCINATE ER 100 MG PO TB24
100.0000 mg | ORAL_TABLET | Freq: Every day | ORAL | Status: DC
  Administered 2016-06-11: 100 mg via ORAL
  Filled 2016-06-10: qty 1

## 2016-06-10 MED ORDER — FUROSEMIDE 10 MG/ML IJ SOLN
40.0000 mg | Freq: Three times a day (TID) | INTRAMUSCULAR | Status: DC
Start: 2016-06-10 — End: 2016-06-11
  Administered 2016-06-10 – 2016-06-11 (×4): 40 mg via INTRAVENOUS
  Filled 2016-06-10 (×4): qty 4

## 2016-06-10 NOTE — Progress Notes (Signed)
      INFECTIOUS DISEASE ATTENDING ADDENDUM:   Date: 06/10/2016  Patient name: Toni Hanna  Medical record number: [id number]  Date of birth: Dec 13, 1935   CLARIFICATION RE ANTIBIOTIC STOP DATES  Discharge antibiotics: Rocephin 2 grams daily x 6 weeks and Gentamicin x 2 weeks  Per pharmacy protocol Gentamicin  Duration:  Rocephin x 6 weeks  Gentamicin x 2 weeks   End Date:  Rocephin is November 18 th  Gentamicin is October 21 th, 2017   [facility] Care Per Protocol:  BIWEEKLY LABS  __ BMP  weekly while on IV antibiotics: _x_ CBC with differential _x_ CRP _x_ ESR _x_ Gentamicin levels  _x_ Please pull PIC at completion of IV antibiotics __ Please leave PIC in place until doctor has seen patient or been notified  Fax weekly labs to [phone]  Clinic Follow Up Appt:  I am happy to see her here in [place] but she wishes to transfer care to [place]. She will need Home Health agency there and ID MD and Surgeon to receive her. We can monitor labs in the meantime.  I think idea of DC to rehab might be good temporary measure given risk to kidneys during first 2 weeks of her therapy  I will sign off for now  Please call with further questions.    This patient has been seen and discussed with the house staff. Please see the resident's note for complete details. I concur with their findings with the following additions/corrections:  [name] 06/10/2016, 9:20 AM

## 2016-06-10 NOTE — Progress Notes (Signed)
PROGRESS NOTE    SHANAYE SCHULDT  [id number] DOB: 1936/05/15 DOA: 06/01/2016 PCP: Robert Bellow, MD   Brief Narrative: MORGHAN HELF a 80 y.o.femalewith medical history significant of breast cancer, history of right breast lumpectomy, CAD, type 2 diabetes, hypertension who is coming to the emergency department due to left knee pain. She had a left knee arthroplasty in [place] about 10 years ago. She states that she fell on the floor on Sunday evening due to weakness and was unable to get up from the floor, next morning when she woke up, she crawled to the other room to reach her phone, [place] phone call for help and was subsequently brought to the emergency department. She estimates that she spent about 12 hours on the floor. She denies fever, but complains of chills, fatigue, generalized weakness, decreased appetite. Dr. Sabra Heck from the emergency department and spoke to Dr. Swintek(orthopedic surgery) who suggested transfer to [facility] for further evaluation and treatment. Patient was found to have sepsis due to septic joint complicated with endocarditis on aortic valve, noted reduced ejection fraction to 40-45%. (TEE)  Assessment & Plan:   Principal Problem:   Pyogenic arthritis of left knee joint (HCC) Active Problems:   Uncontrolled type 2 diabetes mellitus (HCC)   CAD (coronary artery disease)   Hypertension   UTI (urinary tract infection)   Hypophosphatemia   History of breast cancer   Sepsis ([place])   Cellulitis of breast   Group B streptococcal bacteriuria   Group B streptococcal infection   Sepsis due to group B Streptococcus (HCC)   Acute on chronic combined systolic and diastolic congestive heart failure (HCC)   Acute bacterial endocarditis   Prosthetic joint infection, subsequent encounter   Hyperglycemia   Wheezing   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 Endocarditis. Will continue antibiotic therapy with ceftriaxone and gentamycin, will  continue to follow cell count and temperature curve. Will need close follow up as outpatient. PICC line in place.   2. Septic arthritis (prosthesis related). Sp resection, will continue antibiotic therapy, pain control and physical therapy evaluation. Patient will be discharged at [facility]  3. New onset systolic heart failure, Clinically patient more euvolemic, will hold on furosemide for now, follow chest film, personally reviewed noted right base atelectasis with fluid on the right fissure. Positive cephalization of the vasculature, will resume furosemide po in am, will add incentive spirometer, will need follow up chest film in 72 hours.   4. Hypokalemia. Will continue to replete K, follow renal panel in am. Will continue furosemide to target negative fluid balance.   5. T2DM. Will continue glucose cover and monitoring with sliding scale insulin, patient tolerating po well.   6. Atrial fibrillation. Rate controlled, will continue anticoagulation with warfarin.  7. HTN. Blood pressure [id number] systolic.  8. Anemia. HB and hct stable, no signs of bleeding.    DVT prophylaxis: enoxaparin Code Status: full  Family Communication: I spoke with patient's family at the bedside and all questions were addressed. Disposition Plan: SNF  Consultants:   Cardiology  I.D  Orthopedics  Procedures:   Resection left total knee arthroplasty with placement of articulating antibiotic spacer. (06/08/16)   Antimicrobials:    ceftriaxone  genatmycin   Subjective: Patient with no chest pain or dyspnea. Right lower extremity pain controlled, with pain medications. No nausea or vomiting.   Objective: Vitals:   06/10/16 0300 06/10/16 0336 06/10/16 0400 06/10/16 0500  BP: (!) 149/72  Marland Kitchen)  147/67 (!) 124/58  Pulse: 87  (!) 112 (!) 108  Resp: (!) 25  15 18   Temp:  98.9 F (37.2 C)    TempSrc:  Oral    SpO2: 100%  90% 99%  Weight:  94.4 kg (208 lb 1.8 oz)    Height:        Intake/Output Summary  (Last 24 hours) at 06/10/16 0823 Last data filed at 06/10/16 0400  Gross per 24 hour  Intake              460 ml  Output             3530 ml  Net            -3070 ml   Filed Weights   06/01/16 1509 06/10/16 0336  Weight: 82.6 kg (182 lb) 94.4 kg (208 lb 1.8 oz)    Examination:  General exam: Not in pain or dyspnea E ENT: no conjunctival pallor, oral mucosa moist. Respiratory system: Mild decreased breath sounds at bases, no wheezing, rales or rhonchi. Respiratory effort normal. Cardiovascular system: S1 & S2 heard, RRR. No JVD, murmurs, rubs, gallops or clicks. No pedal edema. Gastrointestinal system: Abdomen is nondistended, soft and nontender. No organomegaly or masses felt. Normal bowel sounds heard. Central nervous system: Alert and oriented. No focal neurological deficits. Extremities: Symmetric 5 x 5 power. Skin: No rashes, lesions or ulcers     Data Reviewed: I have personally reviewed following labs and imaging studies  CBC:  Recent Labs Lab 06/04/16 0338  06/06/16 0416 06/07/16 0600 06/08/16 0637 06/09/16 0307 06/10/16 0415  WBC 7.2  < > 6.9 6.2 6.9 7.1 8.7  NEUTROABS 5.1  --   --   --   --   --   --   HGB 10.4*  < > 11.0* 9.9* 10.0* 8.4* 8.8*  HCT 32.5*  < > 34.1* 31.0* 31.1* 26.7* 27.4*  MCV 86.9  < > 87.2 87.3 88.1 88.4 86.7  PLT 128*  < > 176 187 190 175 205  < > = values in this interval not displayed. Basic Metabolic Panel:  Recent Labs Lab 06/04/16 0338  06/06/16 0416 06/07/16 0600 06/08/16 0637 06/09/16 0307 06/10/16 0415  NA 133*  < > 135 137 137 137 136  K 3.3*  < > 3.5 3.3* 3.5 3.7 3.0*  CL 101  < > 103 102 102 100* 97*  CO2 22  < > 24 25 27 27 30   GLUCOSE 199*  < > 248* 167* 146* 191* 187*  BUN 8  < > 6 <5* <5* <5* 6  CREATININE 0.58  < > 0.57 0.55 0.57 0.64 0.56  CALCIUM 8.0*  < > 8.3* 8.4* 8.2* 8.1* 8.2*  MG 1.8  --   --  1.3*  --   --   --   PHOS  --   --   --  2.6  --   --   --   < > = values in this interval not  displayed. GFR: Estimated Creatinine Clearance: 63.3 mL/min (by C-G formula based on SCr of 0.56 mg/dL). Liver Function Tests:  Recent Labs Lab 06/04/16 0338  AST 22  ALT 19  ALKPHOS 79  BILITOT 0.8  PROT 4.9*  ALBUMIN 2.1*   No results for input(s): LIPASE, AMYLASE in the last 168 hours. No results for input(s): AMMONIA in the last 168 hours. Coagulation Profile:  Recent Labs Lab 06/06/16 0416 06/07/16 0600 06/08/16 0500 06/09/16 1046 06/10/16  0415  INR 1.56 1.43 1.34 1.66 2.42   Cardiac Enzymes: No results for input(s): CKTOTAL, CKMB, CKMBINDEX, TROPONINI in the last 168 hours. BNP (last 3 results) No results for input(s): PROBNP in the last 8760 hours. HbA1C: No results for input(s): HGBA1C in the last 72 hours. CBG:  Recent Labs Lab 06/08/16 2308 06/09/16 0732 06/09/16 1151 06/09/16 1627 06/09/16 2204  GLUCAP 153* 175* 235* 223* 250*   Lipid Profile: No results for input(s): CHOL, HDL, LDLCALC, TRIG, CHOLHDL, LDLDIRECT in the last 72 hours. Thyroid Function Tests: No results for input(s): TSH, T4TOTAL, FREET4, T3FREE, THYROIDAB in the last 72 hours. Anemia Panel: No results for input(s): VITAMINB12, FOLATE, FERRITIN, TIBC, IRON, RETICCTPCT in the last 72 hours. Sepsis Labs: No results for input(s): PROCALCITON, LATICACIDVEN in the last 168 hours.  Recent Results (from the past 240 hour(s))  Gram stain     Status: None   Collection Time: 06/01/16  4:50 PM  Result Value Ref Range Status   Specimen Description FLUID SYNOVIAL LEFT KNEE  Final   Special Requests NONE  Final   Gram Stain   Final    ABUNDANT WBC PRESENT, PREDOMINANTLY PMN RARE INTRACELLULAR GRAM POSITIVE COCCI Gram Stain Report Called to,Read Back By and Verified With: EDWARDS,C. AT 1804 ON 06/01/2016 BY BAUGHAM,M.    Report Status 06/01/2016 FINAL  Final  Culture, body fluid-bottle     Status: Abnormal   Collection Time: 06/01/16  4:50 PM  Result Value Ref Range Status   Specimen  Description FLUID SYNOVIAL LEFT KNEE  Final   Special Requests BOTTLES DRAWN AEROBIC AND ANAEROBIC 3CC EACH  Final   Gram Stain   Final    GRAM POSITIVE COCCI IN CHAINS IN BOTH AEROBIC AND ANAEROBIC BOTTLES Gram Stain Report Called to,Read Back By and Verified With: REAP B AT [place] AT 0600 ON [id number] BY FORSYTH K Performed at [place] B STREP(S.AGALACTIAE)ISOLATED (A)  Final   Report Status 06/04/2016 FINAL  Final   Organism ID, Bacteria GROUP B STREP(S.AGALACTIAE)ISOLATED  Final      Susceptibility   Group b strep(s.agalactiae)isolated - MIC*    CLINDAMYCIN >=1 RESISTANT Resistant     AMPICILLIN <=0.25 SENSITIVE Sensitive     ERYTHROMYCIN >=8 RESISTANT Resistant     VANCOMYCIN 0.5 SENSITIVE Sensitive     CEFTRIAXONE <=0.12 SENSITIVE Sensitive     LEVOFLOXACIN 1 SENSITIVE Sensitive     * GROUP B STREP(S.AGALACTIAE)ISOLATED  Blood Culture (routine x 2)     Status: Abnormal   Collection Time: 06/01/16  7:42 PM  Result Value Ref Range Status   Specimen Description BLOOD LEFT HAND  Final   Special Requests BOTTLES DRAWN AEROBIC ONLY [place]  Final   Culture  Setup Time   Final    GRAM POSITIVE COCCI IN CHAINS RECOVERED FROM THE AEROBIC BOTTLE Gram Stain Report Called to,Read Back By and Verified With: AGUIRRE,A. AT [id number] ON 06/02/2016 BY BAUGHAM,M. Performed at [place] ID to follow CRITICAL RESULT CALLED TO, READ BACK BY AND VERIFIED WITH: Denton Brick PHARMD 2300 06/02/16 A BROWNING Performed at [place] B STREP(S.AGALACTIAE)ISOLATED (A)  Final   Report Status 06/04/2016 FINAL  Final   Organism ID, Bacteria GROUP B STREP(S.AGALACTIAE)ISOLATED  Final      Susceptibility   Group b strep(s.agalactiae)isolated - MIC*    CLINDAMYCIN >=1 RESISTANT Resistant     AMPICILLIN <=0.25 SENSITIVE Sensitive     ERYTHROMYCIN >=  8 RESISTANT Resistant     VANCOMYCIN 0.5 SENSITIVE Sensitive     CEFTRIAXONE <=0.12 SENSITIVE Sensitive      LEVOFLOXACIN 1 SENSITIVE Sensitive     * GROUP B STREP(S.AGALACTIAE)ISOLATED  Blood Culture ID Panel (Reflexed)     Status: Abnormal   Collection Time: 06/01/16  7:42 PM  Result Value Ref Range Status   Enterococcus species NOT DETECTED NOT DETECTED Final   Vancomycin resistance NOT DETECTED NOT DETECTED Final   Listeria monocytogenes NOT DETECTED NOT DETECTED Final   Staphylococcus species NOT DETECTED NOT DETECTED Final   Staphylococcus aureus NOT DETECTED NOT DETECTED Final   Methicillin resistance NOT DETECTED NOT DETECTED Final   Streptococcus species DETECTED (A) NOT DETECTED Final    Comment: CRITICAL RESULT CALLED TO, READ BACK BY AND VERIFIED WITH: G ABBOTT PHARMD 2300 06/02/16 A BROWNING    Streptococcus agalactiae DETECTED (A) NOT DETECTED Final    Comment: CRITICAL RESULT CALLED TO, READ BACK BY AND VERIFIED WITH: G ABBOTT PHARMD 2300 06/02/16 A BROWNING    Streptococcus pneumoniae NOT DETECTED NOT DETECTED Final   Streptococcus pyogenes NOT DETECTED NOT DETECTED Final   Acinetobacter baumannii NOT DETECTED NOT DETECTED Final   Enterobacteriaceae species NOT DETECTED NOT DETECTED Final   Enterobacter cloacae complex NOT DETECTED NOT DETECTED Final   Escherichia coli NOT DETECTED NOT DETECTED Final   Klebsiella oxytoca NOT DETECTED NOT DETECTED Final   Klebsiella pneumoniae NOT DETECTED NOT DETECTED Final   Proteus species NOT DETECTED NOT DETECTED Final   Serratia marcescens NOT DETECTED NOT DETECTED Final   Carbapenem resistance NOT DETECTED NOT DETECTED Final   Haemophilus influenzae NOT DETECTED NOT DETECTED Final   Neisseria meningitidis NOT DETECTED NOT DETECTED Final   Pseudomonas aeruginosa NOT DETECTED NOT DETECTED Final   Candida albicans NOT DETECTED NOT DETECTED Final   Candida glabrata NOT DETECTED NOT DETECTED Final   Candida krusei NOT DETECTED NOT DETECTED Final   Candida parapsilosis NOT DETECTED NOT DETECTED Final   Candida tropicalis NOT DETECTED  NOT DETECTED Final    Comment: Performed at [facility]  Blood Culture (routine x 2)     Status: None   Collection Time: 06/01/16  7:54 PM  Result Value Ref Range Status   Specimen Description BLOOD LEFT HAND  Final   Special Requests BOTTLES DRAWN AEROBIC ONLY 6CC  Final   Culture NO GROWTH 5 DAYS  Final   Report Status 06/06/2016 FINAL  Final  Urine culture     Status: Abnormal   Collection Time: 06/01/16 10:01 PM  Result Value Ref Range Status   Specimen Description URINE, CLEAN CATCH  Final   Special Requests NONE  Final   Culture >=100,000 COLONIES/mL ESCHERICHIA COLI (A)  Final   Report Status 06/04/2016 FINAL  Final   Organism ID, Bacteria ESCHERICHIA COLI (A)  Final      Susceptibility   Escherichia coli - MIC*    AMPICILLIN >=32 RESISTANT Resistant     CEFAZOLIN 32 INTERMEDIATE Intermediate     CEFTRIAXONE <=1 SENSITIVE Sensitive     CIPROFLOXACIN <=0.25 SENSITIVE Sensitive     GENTAMICIN <=1 SENSITIVE Sensitive     IMIPENEM <=0.25 SENSITIVE Sensitive     NITROFURANTOIN <=16 SENSITIVE Sensitive     TRIMETH/SULFA <=20 SENSITIVE Sensitive     AMPICILLIN/SULBACTAM >=32 RESISTANT Resistant     PIP/TAZO 8 SENSITIVE Sensitive     Extended ESBL NEGATIVE Sensitive     * >=100,000 COLONIES/mL ESCHERICHIA COLI  MRSA PCR  Screening     Status: None   Collection Time: 06/02/16  2:50 AM  Result Value Ref Range Status   MRSA by PCR NEGATIVE NEGATIVE Final    Comment:        The GeneXpert MRSA Assay (FDA approved for NASAL specimens only), is one component of a comprehensive MRSA colonization surveillance program. It is not intended to diagnose MRSA infection nor to guide or monitor treatment for MRSA infections.   Culture, blood (Routine X 2) w Reflex to ID Panel     Status: None   Collection Time: 06/02/16  6:55 PM  Result Value Ref Range Status   Specimen Description BLOOD LEFT HAND  Final   Special Requests BOTTLES DRAWN AEROBIC ONLY 5CC  Final   Culture NO  GROWTH 5 DAYS  Final   Report Status 06/07/2016 FINAL  Final  Culture, blood (Routine X 2) w Reflex to ID Panel     Status: None   Collection Time: 06/02/16  7:05 PM  Result Value Ref Range Status   Specimen Description BLOOD LEFT ANTECUBITAL  Final   Special Requests BOTTLES DRAWN AEROBIC AND ANAEROBIC 10 CC EA  Final   Culture NO GROWTH 5 DAYS  Final   Report Status 06/07/2016 FINAL  Final  Surgical PCR screen     Status: Abnormal   Collection Time: 06/08/16 12:34 AM  Result Value Ref Range Status   MRSA, PCR NEGATIVE NEGATIVE Final   Staphylococcus aureus POSITIVE (A) NEGATIVE Final    Comment:        The Xpert SA Assay (FDA approved for NASAL specimens in patients over 22 years of age), is one component of a comprehensive surveillance program.  Test performance has been validated by [facility] for patients greater than or equal to 86 year old. It is not intended to diagnose infection nor to guide or monitor treatment.   Aerobic/Anaerobic Culture (surgical/deep wound)     Status: None (Preliminary result)   Collection Time: 06/08/16  5:04 PM  Result Value Ref Range Status   Specimen Description KNEE LEFT  Final   Special Requests SYNOVIAL  Final   Gram Stain   Final    RARE WBC PRESENT, PREDOMINANTLY PMN NO ORGANISMS SEEN    Culture NO GROWTH < 24 HOURS  Final   Report Status PENDING  Incomplete         Radiology Studies: Dg Knee Left Port  Result Date: 06/08/2016 CLINICAL DATA:  History of infected left total knee. Excisional total knee arthroplasty with antibiotic spacers. EXAM: PORTABLE LEFT KNEE - 1-2 VIEW COMPARISON:  06/01/2016 FINDINGS: Removal of the knee arthroplasty and placement of antibiotic spacers. Femoral and tibial stems are completely visualized. No evidence for a periprosthetic fracture. There appears to be a surgical drain. Gas in soft tissues related to recent surgery. Normal alignment of the knee. IMPRESSION: Removal of knee arthroplasty and  placement of antibiotic spacers. Electronically Signed   By: Markus Daft M.D.   On: 06/08/2016 20:27        Scheduled Meds: . anastrozole  1 mg Oral Daily  . cefTRIAXone (ROCEPHIN)  IV  2 g Intravenous Q24H  . docusate sodium  100 mg Oral BID  . dronedarone  400 mg Oral BID WC  . enoxaparin (LOVENOX) injection  40 mg Subcutaneous Q24H  . gentamicin  60 mg Intravenous Q12H  . Influenza vac split quadrivalent PF  0.5 mL Intramuscular Tomorrow-1000  . insulin aspart  0-5 Units Subcutaneous QHS  .  insulin aspart  0-9 Units Subcutaneous TID WC  . insulin glargine  18 Units Subcutaneous QHS  . losartan  100 mg Oral Daily  . metoprolol succinate  50 mg Oral Daily  . multivitamin  2 tablet Oral Daily  . mupirocin ointment  1 application Nasal BID  . oxybutynin  10 mg Oral QHS  . pantoprazole  40 mg Oral Daily  . potassium chloride  40 mEq Oral Q4H  . saccharomyces boulardii  250 mg Oral BID  . senna  1 tablet Oral BID  . sodium chloride flush  10-40 mL Intracatheter Q12H  . Warfarin - Pharmacist Dosing Inpatient   Does not apply q1800   Continuous Infusions:    LOS: 9 days       Ciel Yanes Gerome Apley, MD [facility] Pager [phone]  If 7PM-7AM, please contact night-coverage [email or web address] Password [email or web address] 06/10/2016, 8:23 AM

## 2016-06-10 NOTE — Evaluation (Signed)
Occupational Therapy Evaluation Patient Details Name: Toni Hanna MRN: [id number] DOB: 12-06-1935 Today's Date: 06/10/2016    History of Present Illness Toni Hanna is a 80 y.o. female with medical history significant of breast cancer, history of right breast lumpectomy, CAD, type 2 diabetes, hypertension who is coming to the emergency department due to left knee pain.  Found to have strep bacteremia and endocarditis.  Now s/p resection arthroplasty L TKA and insertion of antibiotic spacer.   Clinical Impression   Pt was independent prior to admission. Presents with generalized weakness, poor balance, decreased activity tolerance and pain interfering with ability to perform ADL and ADL transfers. Pt with plans to d/c to SNF for further rehab. D/C is planned for tomorrow. Will defer further OT to SNF.   Follow Up Recommendations  SNF;Supervision/Assistance - 24 hour    Equipment Recommendations       Recommendations for Other Services       Precautions / Restrictions Precautions Precautions: Fall Required Braces or Orthoses: Knee Immobilizer - Left Knee Immobilizer - Left: On at all times Restrictions Weight Bearing Restrictions: Yes LLE Weight Bearing: Touchdown weight bearing      Mobility Bed Mobility               General bed mobility comments: pt in chair  Transfers                      Balance     Sitting balance-Leahy Scale: Fair       Standing balance-Leahy Scale: Zero                              ADL Overall ADL's : Needs assistance/impaired Eating/Feeding: Independent;Sitting   Grooming: Set up;Sitting   Upper Body Bathing: Minimal assitance;Sitting   Lower Body Bathing: +2 for physical assistance;Total assistance;Sit to/from stand   Upper Body Dressing : Minimal assistance;Sitting   Lower Body Dressing: +2 for physical assistance;Total assistance;Sit to/from stand   Toilet Transfer: +2 for physical  assistance;Moderate assistance;Squat-pivot;BSC   Toileting- Clothing Manipulation and Hygiene: +2 for physical assistance;Total assistance;Sit to/from stand               Vision     Perception     Praxis      Pertinent Vitals/Pain Pain Assessment: Faces Faces Pain Scale: Hurts even more Pain Location: L LE Pain Descriptors / Indicators: Grimacing;Guarding;Aching Pain Intervention(s): Monitored during session;Premedicated before session;Repositioned     Hand Dominance Right   Extremity/Trunk Assessment Upper Extremity Assessment Upper Extremity Assessment: Overall WFL for tasks assessed;Generalized weakness   Lower Extremity Assessment Lower Extremity Assessment: Defer to PT evaluation       Communication Communication Communication: No difficulties   Cognition Arousal/Alertness: Awake/alert Behavior During Therapy: WFL for tasks assessed/performed Overall Cognitive Status: Within Functional Limits for tasks assessed                     General Comments       Exercises       Shoulder Instructions      Home Living Family/patient expects to be discharged to:: Skilled nursing facility                                 Additional Comments: lives in [place], [place] and plans to return there for rehab  Prior Functioning/Environment Level of Independence: Independent        Comments: was independent prior to fall 10/2        OT Problem List: Decreased strength;Decreased activity tolerance;Impaired balance (sitting and/or standing);Decreased knowledge of use of DME or AE;Obesity;Pain;Decreased knowledge of precautions   OT Treatment/Interventions:      OT Goals(Current goals can be found in the care plan section) Acute Rehab OT Goals Patient Stated Goal: To go to rehab in [place]  OT Frequency:     Barriers to D/C:            Co-evaluation              End of Session Equipment Utilized During Treatment: Gait  belt;Oxygen Nurse Communication: Mobility status  Activity Tolerance: Patient tolerated treatment well Patient left: in chair;with call bell/phone within reach;with family/visitor present   Time: [id number] OT Time Calculation (min): 15 min Charges:  OT General Charges $OT Visit: 1 Procedure OT Evaluation $OT Eval Moderate Complexity: 1 Procedure G-Codes:    Malka So 06/10/2016, 4:46 PM  [phone]

## 2016-06-10 NOTE — Progress Notes (Signed)
CSW assisting with discharge planning- transfer form filled out and on chart for MD to sign- MD aware  PASAR submitted and received- faxed to [facility]  Jorge Ny, [place] Worker [phone]

## 2016-06-10 NOTE — Progress Notes (Signed)
ANTICOAGULATION CONSULT NOTE - Follow Up Consult  Pharmacy Consult:  warfarin Indication: atrial fibrillation   Patient Measurements: Height: 5\' 3"  (160 cm) Weight: 208 lb 1.8 oz (94.4 kg) IBW/kg (Calculated) : 52.4 Heparin Dosing Weight: 71 kg  Vital Signs: Temp: 98.9 F (37.2 C) (10/11 0336) Temp Source: Oral (10/11 0336) BP: 124/58 (10/11 0500) Pulse Rate: 91 (10/11 0849)  Labs:  Recent Labs  06/08/16 0500  06/08/16 0637 06/09/16 0307 06/09/16 1046 06/10/16 0415  HGB  --   < > 10.0* 8.4*  --  8.8*  HCT  --   --  31.1* 26.7*  --  27.4*  PLT  --   --  190 175  --  205  LABPROT 16.7*  --   --   --  19.8* 26.8*  INR 1.34  --   --   --  1.66 2.42  CREATININE  --   --  0.57 0.64  --  0.56  < > = values in this interval not displayed.  Estimated Creatinine Clearance: 63.3 mL/min (by C-G formula based on SCr of 0.56 mg/dL).   Assessment: 34 YOF with history of afib (CHADsVASc = 5) and MVrepair, on coumadin PTA. Now s/p TKA and antibiotic spacers on 10/9   INR on admit was 3.15 and s/p vitamin K x2 doses.   Home coumadin dose: 5mg /day  Patient now on bridge back to warfarin with enoxaparin 40 mg q24h INR this am 2.42  Goal of Therapy:  INR 2-3 Monitor platelets by anticoagulation protocol: Yes  Plan:  Warfarin 2.5 mg x 1 Daily INR D/c enox   Levester Fresh, PharmD, BCPS, [facility] Clinical Pharmacist Pager [phone] 06/10/2016 9:20 AM

## 2016-06-10 NOTE — Care Management Note (Addendum)
Case Management Note  Patient Details  Name: Toni Hanna MRN: [id number] Date of Birth: Apr 04, 1936  Subjective/Objective:    Pt and family request pt transfer to [facility] in [place], [place] for continued IV antibx and rehab.  Dtr-in-law has arranged follow-up with an orthopedic surgeon and [facility]; documents faxed to [facility], Dr Abel Presto, and [facility] per request.  [facility] will provide ambulance transportation.    Dtr-in-law has arranged cardiology appointment for Tuesday 05/1716 with Dr Luiz Iron with [facility] [place] [facility].  Faxed documentation to Dr Loetta Rough.                    Expected Discharge Plan:  Skilled Nursing Facility  In-House Referral:  Clinical Social Work  Discharge planning Services  CM Consult  Status of Service:  In process, will continue to follow  Girard Cooter, RN 06/10/2016, 9:46 AM

## 2016-06-10 NOTE — Progress Notes (Signed)
Inpatient Diabetes Program Recommendations  AACE/ADA: New Consensus Statement on Inpatient Glycemic Control (2015)  Target Ranges:  Prepandial:   less than 140 mg/dL      Peak postprandial:   less than 180 mg/dL (1-2 hours)      Critically ill patients:  140 - 180 mg/dL   Results for Toni Hanna, Toni Hanna (MRN [id number]) as of 06/10/2016 11:12  Ref. Range 06/09/2016 07:32 06/09/2016 11:51 06/09/2016 16:27 06/09/2016 22:04 06/10/2016 08:24  Glucose-Capillary Latest Ref Range: 65 - 99 mg/dL 175 (H) 235 (H) 223 (H) 250 (H) 190 (H)  Results for Toni Hanna, Toni Hanna (MRN [id number]) as of 06/10/2016 11:12  Ref. Range 06/02/2016 12:45  Hemoglobin A1C Latest Ref Range: 4.8 - 5.6 % 10.4 (H)    Review of Glycemic Control  Diabetes history: DM2 Outpatient Diabetes medications: Metformin 500 mg BID, Glipizide 10 mg BID Current orders for Inpatient glycemic control: novolog 0-9 units TIDAC and 0-5 units QHS, Lantus 18 units QHS  Inpatient Diabetes Program Recommendations: Please consider:  Increasing Novolog to 0-15 units TIDAC;  Outpatient DM self-management education and support to manage DM long-term. Thank you,  Windy Carina, RN, BSN Diabetes Coordinator Inpatient Diabetes Program [phone] (Team Pager) [phone] ([facility] office) [phone] [facility] office) [phone] [facility] office)

## 2016-06-10 NOTE — Progress Notes (Signed)
Subjective: Patient reports pain as mild to moderate.  Denies N/V/CP/SOB.  Objective:   VITALS:   Vitals:   06/10/16 0300 06/10/16 0336 06/10/16 0400 06/10/16 0500  BP: (!) 149/72  (!) 147/67 (!) 124/58  Pulse: 87  (!) 112 (!) 108  Resp: (!) 25  15 18   Temp:  98.9 F (37.2 C)    TempSrc:  Oral    SpO2: 100%  90% 99%  Weight:  94.4 kg (208 lb 1.8 oz)    Height:        NAD ABD soft Sensation intact distally Intact pulses distally Dorsiflexion/Plantar flexion intact Incision: dressing C/D/I Compartment soft HV ss   Lab Results  Component Value Date   WBC 8.7 06/10/2016   HGB 8.8 (L) 06/10/2016   HCT 27.4 (L) 06/10/2016   MCV 86.7 06/10/2016   PLT 205 06/10/2016   BMET    Component Value Date/Time   NA 136 06/10/2016 0415   K 3.0 (L) 06/10/2016 0415   CL 97 (L) 06/10/2016 0415   CO2 30 06/10/2016 0415   GLUCOSE 187 (H) 06/10/2016 0415   BUN 6 06/10/2016 0415   CREATININE 0.56 06/10/2016 0415   CALCIUM 8.2 (L) 06/10/2016 0415   GFRNONAA >60 06/10/2016 0415   GFRAA >60 06/10/2016 0415    Recent Results (from the past 240 hour(s))  Gram stain     Status: None   Collection Time: 06/01/16  4:50 PM  Result Value Ref Range Status   Specimen Description FLUID SYNOVIAL LEFT KNEE  Final   Special Requests NONE  Final   Gram Stain   Final    ABUNDANT WBC PRESENT, PREDOMINANTLY PMN RARE INTRACELLULAR GRAM POSITIVE COCCI Gram Stain Report Called to,Read Back By and Verified With: EDWARDS,C. AT 1804 ON 06/01/2016 BY BAUGHAM,M.    Report Status 06/01/2016 FINAL  Final  Culture, body fluid-bottle     Status: Abnormal   Collection Time: 06/01/16  4:50 PM  Result Value Ref Range Status   Specimen Description FLUID SYNOVIAL LEFT KNEE  Final   Special Requests BOTTLES DRAWN AEROBIC AND ANAEROBIC 3CC EACH  Final   Gram Stain   Final    GRAM POSITIVE COCCI IN CHAINS IN BOTH AEROBIC AND ANAEROBIC BOTTLES Gram Stain Report Called to,Read Back By and Verified With: REAP  B AT [place] AT 0600 ON [id number] BY FORSYTH K Performed at [place] B STREP(S.AGALACTIAE)ISOLATED (A)  Final   Report Status 06/04/2016 FINAL  Final   Organism ID, Bacteria GROUP B STREP(S.AGALACTIAE)ISOLATED  Final      Susceptibility   Group b strep(s.agalactiae)isolated - MIC*    CLINDAMYCIN >=1 RESISTANT Resistant     AMPICILLIN <=0.25 SENSITIVE Sensitive     ERYTHROMYCIN >=8 RESISTANT Resistant     VANCOMYCIN 0.5 SENSITIVE Sensitive     CEFTRIAXONE <=0.12 SENSITIVE Sensitive     LEVOFLOXACIN 1 SENSITIVE Sensitive     * GROUP B STREP(S.AGALACTIAE)ISOLATED  Blood Culture (routine x 2)     Status: Abnormal   Collection Time: 06/01/16  7:42 PM  Result Value Ref Range Status   Specimen Description BLOOD LEFT HAND  Final   Special Requests BOTTLES DRAWN AEROBIC ONLY 6CC  Final   Culture  Setup Time   Final    GRAM POSITIVE COCCI IN CHAINS RECOVERED FROM THE AEROBIC BOTTLE Gram Stain Report Called to,Read Back By and Verified With: AGUIRRE,A. AT [id number] ON 06/02/2016 BY BAUGHAM,M. Performed at [place]  ID to follow CRITICAL RESULT CALLED TO, READ BACK BY AND VERIFIED WITH: G ABBOTT PHARMD 2300 06/02/16 A BROWNING Performed at [place] B STREP(S.AGALACTIAE)ISOLATED (A)  Final   Report Status 06/04/2016 FINAL  Final   Organism ID, Bacteria GROUP B STREP(S.AGALACTIAE)ISOLATED  Final      Susceptibility   Group b strep(s.agalactiae)isolated - MIC*    CLINDAMYCIN >=1 RESISTANT Resistant     AMPICILLIN <=0.25 SENSITIVE Sensitive     ERYTHROMYCIN >=8 RESISTANT Resistant     VANCOMYCIN 0.5 SENSITIVE Sensitive     CEFTRIAXONE <=0.12 SENSITIVE Sensitive     LEVOFLOXACIN 1 SENSITIVE Sensitive     * GROUP B STREP(S.AGALACTIAE)ISOLATED  Blood Culture ID Panel (Reflexed)     Status: Abnormal   Collection Time: 06/01/16  7:42 PM  Result Value Ref Range Status   Enterococcus species NOT DETECTED NOT DETECTED Final   Vancomycin  resistance NOT DETECTED NOT DETECTED Final   Listeria monocytogenes NOT DETECTED NOT DETECTED Final   Staphylococcus species NOT DETECTED NOT DETECTED Final   Staphylococcus aureus NOT DETECTED NOT DETECTED Final   Methicillin resistance NOT DETECTED NOT DETECTED Final   Streptococcus species DETECTED (A) NOT DETECTED Final    Comment: CRITICAL RESULT CALLED TO, READ BACK BY AND VERIFIED WITH: G ABBOTT PHARMD 2300 06/02/16 A BROWNING    Streptococcus agalactiae DETECTED (A) NOT DETECTED Final    Comment: CRITICAL RESULT CALLED TO, READ BACK BY AND VERIFIED WITH: G ABBOTT PHARMD 2300 06/02/16 A BROWNING    Streptococcus pneumoniae NOT DETECTED NOT DETECTED Final   Streptococcus pyogenes NOT DETECTED NOT DETECTED Final   Acinetobacter baumannii NOT DETECTED NOT DETECTED Final   Enterobacteriaceae species NOT DETECTED NOT DETECTED Final   Enterobacter cloacae complex NOT DETECTED NOT DETECTED Final   Escherichia coli NOT DETECTED NOT DETECTED Final   Klebsiella oxytoca NOT DETECTED NOT DETECTED Final   Klebsiella pneumoniae NOT DETECTED NOT DETECTED Final   Proteus species NOT DETECTED NOT DETECTED Final   Serratia marcescens NOT DETECTED NOT DETECTED Final   Carbapenem resistance NOT DETECTED NOT DETECTED Final   Haemophilus influenzae NOT DETECTED NOT DETECTED Final   Neisseria meningitidis NOT DETECTED NOT DETECTED Final   Pseudomonas aeruginosa NOT DETECTED NOT DETECTED Final   Candida albicans NOT DETECTED NOT DETECTED Final   Candida glabrata NOT DETECTED NOT DETECTED Final   Candida krusei NOT DETECTED NOT DETECTED Final   Candida parapsilosis NOT DETECTED NOT DETECTED Final   Candida tropicalis NOT DETECTED NOT DETECTED Final    Comment: Performed at [facility]  Blood Culture (routine x 2)     Status: None   Collection Time: 06/01/16  7:54 PM  Result Value Ref Range Status   Specimen Description BLOOD LEFT HAND  Final   Special Requests BOTTLES DRAWN AEROBIC ONLY [place]   Final   Culture NO GROWTH 5 DAYS  Final   Report Status 06/06/2016 FINAL  Final  Urine culture     Status: Abnormal   Collection Time: 06/01/16 10:01 PM  Result Value Ref Range Status   Specimen Description URINE, CLEAN CATCH  Final   Special Requests NONE  Final   Culture >=100,000 COLONIES/mL ESCHERICHIA COLI (A)  Final   Report Status 06/04/2016 FINAL  Final   Organism ID, Bacteria ESCHERICHIA COLI (A)  Final      Susceptibility   Escherichia coli - MIC*    AMPICILLIN >=32 RESISTANT Resistant     CEFAZOLIN 32  INTERMEDIATE Intermediate     CEFTRIAXONE <=1 SENSITIVE Sensitive     CIPROFLOXACIN <=0.25 SENSITIVE Sensitive     GENTAMICIN <=1 SENSITIVE Sensitive     IMIPENEM <=0.25 SENSITIVE Sensitive     NITROFURANTOIN <=16 SENSITIVE Sensitive     TRIMETH/SULFA <=20 SENSITIVE Sensitive     AMPICILLIN/SULBACTAM >=32 RESISTANT Resistant     PIP/TAZO 8 SENSITIVE Sensitive     Extended ESBL NEGATIVE Sensitive     * >=100,000 COLONIES/mL ESCHERICHIA COLI  MRSA PCR Screening     Status: None   Collection Time: 06/02/16  2:50 AM  Result Value Ref Range Status   MRSA by PCR NEGATIVE NEGATIVE Final    Comment:        The GeneXpert MRSA Assay (FDA approved for NASAL specimens only), is one component of a comprehensive MRSA colonization surveillance program. It is not intended to diagnose MRSA infection nor to guide or monitor treatment for MRSA infections.   Culture, blood (Routine X 2) w Reflex to ID Panel     Status: None   Collection Time: 06/02/16  6:55 PM  Result Value Ref Range Status   Specimen Description BLOOD LEFT HAND  Final   Special Requests BOTTLES DRAWN AEROBIC ONLY 5CC  Final   Culture NO GROWTH 5 DAYS  Final   Report Status 06/07/2016 FINAL  Final  Culture, blood (Routine X 2) w Reflex to ID Panel     Status: None   Collection Time: 06/02/16  7:05 PM  Result Value Ref Range Status   Specimen Description BLOOD LEFT ANTECUBITAL  Final   Special Requests BOTTLES  DRAWN AEROBIC AND ANAEROBIC 10 CC EA  Final   Culture NO GROWTH 5 DAYS  Final   Report Status 06/07/2016 FINAL  Final  Surgical PCR screen     Status: Abnormal   Collection Time: 06/08/16 12:34 AM  Result Value Ref Range Status   MRSA, PCR NEGATIVE NEGATIVE Final   Staphylococcus aureus POSITIVE (A) NEGATIVE Final    Comment:        The Xpert SA Assay (FDA approved for NASAL specimens in patients over 104 years of age), is one component of a comprehensive surveillance program.  Test performance has been validated by [facility] for patients greater than or equal to 48 year old. It is not intended to diagnose infection nor to guide or monitor treatment.   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) Active Problems:   Uncontrolled type 2 diabetes mellitus (HCC)   CAD (coronary artery disease)   Hypertension   UTI (urinary tract infection)   Hypophosphatemia   History of breast cancer   Sepsis ([place])   Cellulitis of breast   Group B streptococcal bacteriuria   Group B streptococcal infection   Sepsis due to group B Streptococcus (HCC)   Acute on chronic combined systolic and diastolic congestive heart failure (HCC)   Acute bacterial endocarditis   Prosthetic joint infection, subsequent encounter  Hyperglycemia   Wheezing   Infection of total knee replacement (HCC)   A/P: 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 Needs outpatient f/u in 2 weeks   Kathlean Cinco, Horald Pollen 06/10/2016, 8:02 AM   Rod Can, MD Cell [phone]

## 2016-06-10 NOTE — Progress Notes (Signed)
Physical Therapy Treatment Patient Details Name: Toni Hanna MRN: [id number] DOB: 1935-12-08 Today's Date: 06/10/2016    History of Present Illness Toni Hanna is a 80 y.o. female with medical history significant of breast cancer, history of right breast lumpectomy, CAD, type 2 diabetes, hypertension who is coming to the emergency department due to left knee pain.  Found to have strep bacteremia and endocarditis.  Now s/p resection arthroplasty L TKA and insertion of antibiotic spacer.    PT Comments    Pt having difficulty maintaining TDWB left due to generalized weakness, ie difficulty bearing sufficient wt through UE's to step correctly with LE's. Education given on exercises, WB status, and pt's need to transfer to right. Mod A +2 to transfer to chair. PT will continue to follow.   Follow Up Recommendations  SNF     Equipment Recommendations  Other (comment)    Recommendations for Other Services       Precautions / Restrictions Precautions Precautions: Fall Required Braces or Orthoses: Knee Immobilizer - Left Knee Immobilizer - Left: On at all times Restrictions Weight Bearing Restrictions: Yes LLE Weight Bearing: Touchdown weight bearing    Mobility  Bed Mobility Overal bed mobility: Needs Assistance Bed Mobility: Supine to Sit     Supine to sit: Mod assist;+2 for physical assistance     General bed mobility comments: LLE supported, mod A for bringing hips to EOB, min A at trunk to achieve upright position  Transfers Overall transfer level: Needs assistance Equipment used: Rolling walker (2 wheeled) Transfers: Sit to/from [facility] Sit to Stand: Mod assist;+2 physical assistance Stand pivot transfers: Mod assist;+2 physical assistance       General transfer comment: vc's for TDWB and to lift chest, pt tends to keep trunk flexed making it very difficult to keep LLE TDWB. Pt with RLE weakness, had difficulty extending right knee to reach full  standing. Pivot on right foot to chair.  Ambulation/Gait             General Gait Details: unable today due to fatigue and difficulty keeping LLE TDWB, could not effectively take wt through UE's to step right foot   Stairs            Wheelchair Mobility    Modified Rankin (Stroke Patients Only)       Balance Overall balance assessment: Needs assistance Sitting-balance support: Single extremity supported;Feet supported Sitting balance-Leahy Scale: Fair Sitting balance - Comments: right lean Postural control: Right lateral lean Standing balance support: Bilateral upper extremity supported Standing balance-Leahy Scale: Zero                      Cognition Arousal/Alertness: Awake/alert Behavior During Therapy: WFL for tasks assessed/performed Overall Cognitive Status: Within Functional Limits for tasks assessed                      Exercises Total Joint Exercises Ankle Circles/Pumps: AROM;5 reps;10 reps;Seated General Exercises - Upper Extremity Chair Push Up: AROM;5 reps General Exercises - Lower Extremity Gluteal Sets: AROM;10 reps    General Comments General comments (skin integrity, edema, etc.): Had pt practice scapular retraction in sitting and shoulder rolls to address general achiness      Pertinent Vitals/Pain Pain Assessment: Faces Faces Pain Scale: Hurts even more Pain Location: L knee Pain Descriptors / Indicators: Aching;Grimacing;Guarding Pain Intervention(s): Limited activity within patient's tolerance;Premedicated before session;Monitored during session    Home Living  Prior Function            PT Goals (current goals can now be found in the care plan section) Acute Rehab PT Goals Patient Stated Goal: To go to rehab in [place] PT Goal Formulation: With patient/family Time For Goal Achievement: 06/23/16 Potential to Achieve Goals: Good Progress towards PT goals: Progressing toward  goals    Frequency    Min 3X/week      PT Plan Current plan remains appropriate    Co-evaluation             End of Session Equipment Utilized During Treatment: Gait belt;Left knee immobilizer Activity Tolerance: Patient limited by fatigue;Patient limited by pain Patient left: in chair;with call bell/phone within reach;with family/visitor present     Time: [id number] PT Time Calculation (min) (ACUTE ONLY): 23 min  Charges:  $Therapeutic Activity: 23-37 mins                    G Codes:     Leighton Roach, PT  Acute Rehab Services  [phone]  Leighton Roach 06/10/2016, 11:45 AM

## 2016-06-10 NOTE — Progress Notes (Signed)
Patient Name: Toni Hanna Date of Encounter: 06/10/2016  Hospital Problem List     Principal Problem:   Pyogenic arthritis of left knee joint ([place]) Active Problems:   Uncontrolled type 2 diabetes mellitus (HCC)   CAD (coronary artery disease)   Hypertension   UTI (urinary tract infection)   Hypophosphatemia   History of breast cancer   Sepsis ([place])   Cellulitis of breast   Group B streptococcal bacteriuria   Group B streptococcal infection   Sepsis due to group B Streptococcus (HCC)   Acute on chronic combined systolic and diastolic congestive heart failure (HCC)   Acute bacterial endocarditis   Prosthetic joint infection, subsequent encounter   Hyperglycemia   Wheezing   Infection of total knee replacement [facility])    Patient Profile     80 year old with history of mitral valve repair, prosthetic left knee replacement, breast cancer, visiting [place] who was admitted with right breast erythema, severe knee pain, chills, fatigue, blood cultures with group B strep.   Cardiology is following for her cardiac issues which include an echocardiogram demonstrating an ejection fraction of 30-35% with mitral valve repair, mild MR, moderate tricuspid regurgitation with moderately elevated pulmonary pressures.  She did have an echocardiogram in June in [place], where she lives, which demonstrated an EF of 50-55%.  TEE done this admission demonstrated EF 40-45%, small oscillating densities on noncoronary and right aortic cuspsn  Subjective   She has been having SOB.  She had knee pain moderate this morning.   Inpatient Medications    . anastrozole  1 mg Oral Daily  . cefTRIAXone (ROCEPHIN)  IV  2 g Intravenous Q24H  . docusate sodium  100 mg Oral BID  . dronedarone  400 mg Oral BID WC  . gentamicin  60 mg Intravenous Q12H  . Influenza vac split quadrivalent PF  0.5 mL Intramuscular Tomorrow-1000  . insulin aspart  0-5 Units Subcutaneous QHS  . insulin aspart  0-9 Units  Subcutaneous TID WC  . insulin glargine  18 Units Subcutaneous QHS  . losartan  100 mg Oral Daily  . metoprolol succinate  50 mg Oral Daily  . multivitamin  2 tablet Oral Daily  . mupirocin ointment  1 application Nasal BID  . oxybutynin  10 mg Oral QHS  . pantoprazole  40 mg Oral Daily  . potassium chloride  40 mEq Oral Q4H  . saccharomyces boulardii  250 mg Oral BID  . senna  1 tablet Oral BID  . sodium chloride flush  10-40 mL Intracatheter Q12H  . warfarin  2.5 mg Oral ONCE-1800  . Warfarin - Pharmacist Dosing Inpatient   Does not apply q1800    Vital Signs    Vitals:   06/10/16 0400 06/10/16 0500 06/10/16 0849 06/10/16 1035  BP: (!) 147/67 (!) 124/58    Pulse: (!) 112 (!) 108 91 (!) 117  Resp: 15 18 (!) 23 (!) 23  Temp:      TempSrc:      SpO2: 90% 99% 100% 93%  Weight:      Height:        Intake/Output Summary (Last 24 hours) at 06/10/16 1055 Last data filed at 06/10/16 0400  Gross per 24 hour  Intake              100 ml  Output             2730 ml  Net            -  2630 ml   Filed Weights   06/01/16 1509 06/10/16 0336  Weight: 182 lb (82.6 kg) 208 lb 1.8 oz (94.4 kg)    Physical Exam    GEN: Well nourished, well developed, in  no acute distress.  Neck: Supple, no JVD, carotid bruits, or masses. Cardiac: RRR, no  rubs, or gallops. No clubbing, cyanosis, moderate edema.  Radials/DP/PT 2+ and equal bilaterally.  Respiratory:  Respirations  regular and unlabored, bibasilar crackles.  GI: Soft, nontender, nondistended, BS + x 4. Neuro:  Strength and sensation are intact.   Labs    CBC  Recent Labs  06/09/16 0307 06/10/16 0415  WBC 7.1 8.7  HGB 8.4* 8.8*  HCT 26.7* 27.4*  MCV 88.4 86.7  PLT 175 [id number]   Basic Metabolic Panel  Recent Labs  06/09/16 0307 06/10/16 0415  NA 137 136  K 3.7 3.0*  CL 100* 97*  CO2 27 30  GLUCOSE 191* 187*  BUN <5* 6  CREATININE 0.64 0.56  CALCIUM 8.1* 8.2*   Liver Function Tests No results for input(s): AST,  ALT, ALKPHOS, BILITOT, PROT, ALBUMIN in the last 72 hours. No results for input(s): LIPASE, AMYLASE in the last 72 hours. Cardiac Enzymes No results for input(s): CKTOTAL, CKMB, CKMBINDEX, TROPONINI in the last 72 hours. BNP Invalid input(s): POCBNP D-Dimer No results for input(s): DDIMER in the last 72 hours. Hemoglobin A1C No results for input(s): HGBA1C in the last 72 hours. Fasting Lipid Panel No results for input(s): CHOL, HDL, LDLCALC, TRIG, CHOLHDL, LDLDIRECT in the last 72 hours. Thyroid Function Tests No results for input(s): TSH, T4TOTAL, T3FREE, THYROIDAB in the last 72 hours.  Invalid input(s): FREET3  Telemetry    NSR, sinus tach and atrial tach.   ECG    NA  Radiology    Dg Chest 2 View  Result Date: 06/10/2016 CLINICAL DATA:  Shortness of breath for several days. Pulmonary edema. EXAM: CHEST  2 VIEW COMPARISON:  06/06/2016 FINDINGS: Cardiomegaly. Prior valve replacement. Lingular atelectasis. Airspace opacities increasing in the right upper lobe and right lung base. Cannot exclude pneumonia. Mild interstitial prominence throughout the lungs could reflect mild interstitial edema. Small bilateral effusions. IMPRESSION: Cardiomegaly, possible mild interstitial edema. Small bilateral effusions. Increasing right upper lobe and right basilar airspace opacities, cannot exclude pneumonia. Electronically Signed   By: Rolm Baptise M.D.   On: 06/10/2016 09:41   Dg Chest Port 1 View  Result Date: 06/06/2016 CLINICAL DATA:  Wheezing with shortness of breath and chest pain EXAM: PORTABLE CHEST 1 VIEW COMPARISON:  June 04, 2016 FINDINGS: Central catheter tip remains in the superior vena cava. No pneumothorax. There is patchy bibasilar atelectasis, stable. There is a small left pleural effusion. There is cardiomegaly with mild pulmonary venous hypertension. There is a prosthetic valve present. There is atherosclerotic calcification aorta. No adenopathy. IMPRESSION: Pulmonary  vascular congestion. No frank edema or consolidation. Small left pleural effusion. Stable patchy bibasilar atelectasis. No new opacity evident. Electronically Signed   By: Lowella Grip III M.D.   On: 06/06/2016 11:48   Dg Chest Port 1 View  Result Date: 06/04/2016 CLINICAL DATA:  Left-sided PICC line insertion EXAM: PORTABLE CHEST 1 VIEW COMPARISON:  06/01/2016 FINDINGS: Left-sided PICC line terminates in the distal SVC. Heart is enlarged. There is atelectasis at each lung base and lingula. Subtle confluent pulmonary opacities in the right lung base cannot exclude a small pneumonic consolidation. Aortic atherosclerosis at its arch. Cardiac valvular prosthetic with median sternotomy sutures are again noted.  Surgical clips project over the right mid hemithorax. IMPRESSION: PICC line tip in the distal SVC from left-sided approach. Bibasilar atelectasis again noted. Possible small focus of right basilar pneumonia. Electronically Signed   By: Ashley Royalty M.D.   On: 06/04/2016 17:13   Dg Chest Port 1 View  Result Date: 06/01/2016 CLINICAL DATA:  Sepsis. EXAM: PORTABLE CHEST 1 VIEW COMPARISON:  None. FINDINGS: Borderline enlarged cardiac silhouette. Median sternotomy wires and prosthetic heart valve. Linear density in both lower lung zones. Surgical clips overlying the right mid lung zone. Unremarkable bones. IMPRESSION: Bilateral lower lung zone linear atelectasis or scarring. Electronically Signed   By: Claudie Revering M.D.   On: 06/01/2016 19:50   Dg Knee Left Port  Result Date: 06/08/2016 CLINICAL DATA:  History of infected left total knee. Excisional total knee arthroplasty with antibiotic spacers. EXAM: PORTABLE LEFT KNEE - 1-2 VIEW COMPARISON:  06/01/2016 FINDINGS: Removal of the knee arthroplasty and placement of antibiotic spacers. Femoral and tibial stems are completely visualized. No evidence for a periprosthetic fracture. There appears to be a surgical drain. Gas in soft tissues related to recent  surgery. Normal alignment of the knee. IMPRESSION: Removal of knee arthroplasty and placement of antibiotic spacers. Electronically Signed   By: Markus Daft M.D.   On: 06/08/2016 20:27   Dg Knee Left Port  Result Date: 06/01/2016 CLINICAL DATA:  Pain and swelling since Saturday.  No known injury. EXAM: PORTABLE LEFT KNEE - 1-2 VIEW COMPARISON:  None. FINDINGS: The left knee demonstrates a total knee arthroplasty without evidence of hardware failure complication. There is a small joint effusion. There is no fracture or dislocation. The alignment is anatomic. IMPRESSION: Left total knee arthroplasty without failure or complication. Electronically Signed   By: Kathreen Devoid   On: 06/01/2016 16:22    Assessment & Plan    ACUTE SYSTOLIC HF:   She is still many liters positive since admission.  I will increase diuresis today.  I will give increased potassium supplement.  She is going to need very close follow up of her volume status after she leaves tomorrow.    ATRIAL FIB:  She is currently in sinus and we have chosen to continue Multaq. She has sinus tach with PACS.    ENDOCARDITIS:  We will plan on 6 weeks of IV ceftriaxone and 2 weeks of IV gentamicin with day #1 being Saturday.  She had removal of left knee prosthesis.   HTN:  BP is increased.  I increased the beta blocker.    Signed, Minus Breeding, MD  06/10/2016, 10:55 AM

## 2016-06-10 NOTE — Progress Notes (Signed)
Pharmacy Antibiotic Note  Toni Hanna is a 80 y.o. female admitted on 06/01/2016 with GBS bacteremia/endocarditis.  Pharmacy has been consulted for Gentamicin dosing for synergy. Gent trough <0.5 and Gent peak 2.6.   Plan: -Continue Gentamicin 60 mg IV q12h -Trend clinical status, GT weekly or with any changes in renal function  Height: 5\' 3"  (160 cm) Weight: 182 lb (82.6 kg) IBW/kg (Calculated) : 52.4  Temp (24hrs), Avg:99.3 F (37.4 C), Min:98.9 F (37.2 C), Max:100 F (37.8 C)   Recent Labs Lab 06/05/16 0500 06/06/16 0416 06/06/16 0930 06/06/16 1100 06/07/16 0600 06/08/16 0637 06/09/16 0307 06/09/16 2130 06/09/16 2300  WBC 6.9 6.9  --   --  6.2 6.9 7.1  --   --   CREATININE 0.55 0.57  --   --  0.55 0.57 0.64  --   --   GENTTROUGH  --   --  0.5  --   --   --   --  <0.5*  --   GENTPEAK  --   --   --  4.4*  --   --   --   --  2.6*    Estimated Creatinine Clearance: 59 mL/min (by C-G formula based on SCr of 0.64 mg/dL).     Antimicrobials this admission: Vanc 10/2 >> 10/4 Aztreonam 10/2 >> 10/4 Ancef 10/4>>10/4 CTX 10/4 >> Gent 10/5 >>  Dose adjustments this admission: 10/7 GP 4.4 (goal 3-4 mcg/mL); GT 0.5 (goal < 1 mcg/mL) >> con't 60mg  q12  10/11 Gent peak: 2.6, Gent trough: <0.5 (goal <1 mg/L) >> cont gent 60 mg IV q12h  Microbiology results: 10/2 Synovial fluid cx - Strep aglactiae 10/3 MRSA PCR - negative 10/2 UCx - 100K E.coli 10/2 BCx2 - Strep agalactiae 1 of 2 (BCID same, S- amp, CTX, LVQ, vanc) 10/3 BCx2 - NGTD  Narda Bonds 06/10/2016 12:47 AM

## 2016-06-11 ENCOUNTER — Encounter (HOSPITAL_COMMUNITY): Payer: Self-pay | Admitting: Orthopedic Surgery

## 2016-06-11 LAB — BASIC METABOLIC PANEL
ANION GAP: 7 (ref 5–15)
BUN: 6 mg/dL (ref 6–20)
CHLORIDE: 96 mmol/L — AB (ref 101–111)
CO2: 35 mmol/L — AB (ref 22–32)
CREATININE: 0.67 mg/dL (ref 0.44–1.00)
Calcium: 8.5 mg/dL — ABNORMAL LOW (ref 8.9–10.3)
GFR calc non Af Amer: >60 mL/min (ref >60)
Glucose, Bld: 137 mg/dL — ABNORMAL HIGH (ref 65–99)
Potassium: 4 mmol/L (ref 3.5–5.1)
Sodium: 138 mmol/L (ref 135–145)

## 2016-06-11 LAB — PROTIME-INR
INR: 2.45
Prothrombin Time: 27 seconds — ABNORMAL HIGH (ref 11.4–15.2)

## 2016-06-11 LAB — GLUCOSE, CAPILLARY: Glucose-Capillary: 116 mg/dL — ABNORMAL HIGH (ref 65–99)

## 2016-06-11 MED ORDER — BISACODYL 10 MG RE SUPP: 10.0000 mg | Freq: Every day | RECTAL | 0 refills | Status: AC | PRN

## 2016-06-11 MED ORDER — DOCUSATE SODIUM 100 MG PO CAPS: 100.0000 mg | ORAL_CAPSULE | Freq: Two times a day (BID) | ORAL | 0 refills | Status: AC

## 2016-06-11 MED ORDER — INSULIN GLARGINE 100 UNIT/ML ~~LOC~~ SOLN: 18.0000 [IU] | Freq: Every day | SUBCUTANEOUS | 11 refills | Status: AC

## 2016-06-11 MED ORDER — HYDROCODONE-ACETAMINOPHEN 5-325 MG PO TABS: 1.0000 | ORAL_TABLET | Freq: Four times a day (QID) | ORAL | 0 refills | Status: AC | PRN

## 2016-06-11 MED ORDER — ACETAMINOPHEN 325 MG PO TABS: 325.0000 mg | ORAL_TABLET | Freq: Three times a day (TID) | ORAL | 0 refills | Status: AC | PRN

## 2016-06-11 MED ORDER — SACCHAROMYCES BOULARDII 250 MG PO CAPS: 250.0000 mg | ORAL_CAPSULE | Freq: Two times a day (BID) | ORAL | 0 refills | Status: AC

## 2016-06-11 MED ORDER — INSULIN ASPART 100 UNIT/ML ~~LOC~~ SOLN: 0.0000 [IU] | Freq: Every day | SUBCUTANEOUS | 11 refills | Status: AC

## 2016-06-11 MED ORDER — POTASSIUM CHLORIDE ER 10 MEQ PO TBCR: 20.0000 meq | EXTENDED_RELEASE_TABLET | Freq: Every day | ORAL | 0 refills | Status: AC

## 2016-06-11 MED ORDER — METOPROLOL SUCCINATE ER 100 MG PO TB24: 100.0000 mg | ORAL_TABLET | Freq: Every day | ORAL | 0 refills | Status: AC

## 2016-06-11 MED ORDER — POLYETHYLENE GLYCOL 3350 17 G PO PACK: 17.0000 g | PACK | Freq: Every day | ORAL | 0 refills | Status: AC | PRN

## 2016-06-11 MED ORDER — PANTOPRAZOLE SODIUM 40 MG PO TBEC: 40.0000 mg | DELAYED_RELEASE_TABLET | Freq: Every day | ORAL | 0 refills | Status: AC

## 2016-06-11 MED ORDER — GENTAMICIN IN SALINE 1.2-0.9 MG/ML-% IV SOLN: 60.0000 mg | Freq: Two times a day (BID) | INTRAVENOUS | 0 refills | Status: AC

## 2016-06-11 MED ORDER — WARFARIN SODIUM 5 MG PO TABS: 5.0000 mg | ORAL_TABLET | Freq: Once | ORAL | Status: DC

## 2016-06-11 MED ORDER — OXYBUTYNIN CHLORIDE ER 10 MG PO TB24: 10.0000 mg | ORAL_TABLET | Freq: Every day | ORAL | 0 refills | Status: AC

## 2016-06-11 MED ORDER — DEXTROSE 5 % IV SOLN: 2.0000 g | INTRAVENOUS | 0 refills | Status: AC

## 2016-06-11 NOTE — Discharge Instructions (Signed)
Knee immobilizer at all times for 2 weeks Touch down weight bearing left leg with a walker Remove Aquacel dressing 2 weeks after surgery TED stockings for 2 weeks to help with swelling. May remove for sleeping.

## 2016-06-11 NOTE — Progress Notes (Signed)
Subjective: Patient reports pain as mild to moderate.  Denies N/V/CP/SOB.  Objective:   VITALS:   Vitals:   06/11/16 0200 06/11/16 0300 06/11/16 0500 06/11/16 0600  BP: 127/63 123/66 135/65 117/64  Pulse: 85 (!) 108 93 (!) 109  Resp: 18 20 17 17   Temp:    97.9 F (36.6 C)  TempSrc:    Oral  SpO2: 96% 96% 95% 97%  Weight:   93.4 kg (205 lb 14.6 oz)   Height:        NAD ABD soft Sensation intact distally Intact pulses distally Dorsiflexion/Plantar flexion intact Incision: dressing C/D/I Compartment soft   Lab Results  Component Value Date   WBC 8.7 06/10/2016   HGB 8.8 (L) 06/10/2016   HCT 27.4 (L) 06/10/2016   MCV 86.7 06/10/2016   PLT 205 06/10/2016   BMET    Component Value Date/Time   NA 138 06/11/2016 0400   K 4.0 06/11/2016 0400   CL 96 (L) 06/11/2016 0400   CO2 35 (H) 06/11/2016 0400   GLUCOSE 137 (H) 06/11/2016 0400   BUN 6 06/11/2016 0400   CREATININE 0.67 06/11/2016 0400   CALCIUM 8.5 (L) 06/11/2016 0400   GFRNONAA >60 06/11/2016 0400   GFRAA >60 06/11/2016 0400    Recent Results (from the past 240 hour(s))  Gram stain     Status: None   Collection Time: 06/01/16  4:50 PM  Result Value Ref Range Status   Specimen Description FLUID SYNOVIAL LEFT KNEE  Final   Special Requests NONE  Final   Gram Stain   Final    ABUNDANT WBC PRESENT, PREDOMINANTLY PMN RARE INTRACELLULAR GRAM POSITIVE COCCI Gram Stain Report Called to,Read Back By and Verified With: EDWARDS,C. AT 1804 ON 06/01/2016 BY BAUGHAM,M.    Report Status 06/01/2016 FINAL  Final  Culture, body fluid-bottle     Status: Abnormal   Collection Time: 06/01/16  4:50 PM  Result Value Ref Range Status   Specimen Description FLUID SYNOVIAL LEFT KNEE  Final   Special Requests BOTTLES DRAWN AEROBIC AND ANAEROBIC 3CC EACH  Final   Gram Stain   Final    GRAM POSITIVE COCCI IN CHAINS IN BOTH AEROBIC AND ANAEROBIC BOTTLES Gram Stain Report Called to,Read Back By and Verified With: REAP B AT [place] AT  0600 ON [id number] BY FORSYTH K Performed at [place] B STREP(S.AGALACTIAE)ISOLATED (A)  Final   Report Status 06/04/2016 FINAL  Final   Organism ID, Bacteria GROUP B STREP(S.AGALACTIAE)ISOLATED  Final      Susceptibility   Group b strep(s.agalactiae)isolated - MIC*    CLINDAMYCIN >=1 RESISTANT Resistant     AMPICILLIN <=0.25 SENSITIVE Sensitive     ERYTHROMYCIN >=8 RESISTANT Resistant     VANCOMYCIN 0.5 SENSITIVE Sensitive     CEFTRIAXONE <=0.12 SENSITIVE Sensitive     LEVOFLOXACIN 1 SENSITIVE Sensitive     * GROUP B STREP(S.AGALACTIAE)ISOLATED  Blood Culture (routine x 2)     Status: Abnormal   Collection Time: 06/01/16  7:42 PM  Result Value Ref Range Status   Specimen Description BLOOD LEFT HAND  Final   Special Requests BOTTLES DRAWN AEROBIC ONLY 6CC  Final   Culture  Setup Time   Final    GRAM POSITIVE COCCI IN CHAINS RECOVERED FROM THE AEROBIC BOTTLE Gram Stain Report Called to,Read Back By and Verified With: AGUIRRE,A. AT [id number] ON 06/02/2016 BY BAUGHAM,M. Performed at [facility] Organism ID to follow CRITICAL RESULT CALLED  TO, READ BACK BY AND VERIFIED WITH: G ABBOTT PHARMD 2300 06/02/16 A BROWNING Performed at [place] B STREP(S.AGALACTIAE)ISOLATED (A)  Final   Report Status 06/04/2016 FINAL  Final   Organism ID, Bacteria GROUP B STREP(S.AGALACTIAE)ISOLATED  Final      Susceptibility   Group b strep(s.agalactiae)isolated - MIC*    CLINDAMYCIN >=1 RESISTANT Resistant     AMPICILLIN <=0.25 SENSITIVE Sensitive     ERYTHROMYCIN >=8 RESISTANT Resistant     VANCOMYCIN 0.5 SENSITIVE Sensitive     CEFTRIAXONE <=0.12 SENSITIVE Sensitive     LEVOFLOXACIN 1 SENSITIVE Sensitive     * GROUP B STREP(S.AGALACTIAE)ISOLATED  Blood Culture ID Panel (Reflexed)     Status: Abnormal   Collection Time: 06/01/16  7:42 PM  Result Value Ref Range Status   Enterococcus species NOT DETECTED NOT DETECTED Final   Vancomycin resistance  NOT DETECTED NOT DETECTED Final   Listeria monocytogenes NOT DETECTED NOT DETECTED Final   Staphylococcus species NOT DETECTED NOT DETECTED Final   Staphylococcus aureus NOT DETECTED NOT DETECTED Final   Methicillin resistance NOT DETECTED NOT DETECTED Final   Streptococcus species DETECTED (A) NOT DETECTED Final    Comment: CRITICAL RESULT CALLED TO, READ BACK BY AND VERIFIED WITH: G ABBOTT PHARMD 2300 06/02/16 A BROWNING    Streptococcus agalactiae DETECTED (A) NOT DETECTED Final    Comment: CRITICAL RESULT CALLED TO, READ BACK BY AND VERIFIED WITH: G ABBOTT PHARMD 2300 06/02/16 A BROWNING    Streptococcus pneumoniae NOT DETECTED NOT DETECTED Final   Streptococcus pyogenes NOT DETECTED NOT DETECTED Final   Acinetobacter baumannii NOT DETECTED NOT DETECTED Final   Enterobacteriaceae species NOT DETECTED NOT DETECTED Final   Enterobacter cloacae complex NOT DETECTED NOT DETECTED Final   Escherichia coli NOT DETECTED NOT DETECTED Final   Klebsiella oxytoca NOT DETECTED NOT DETECTED Final   Klebsiella pneumoniae NOT DETECTED NOT DETECTED Final   Proteus species NOT DETECTED NOT DETECTED Final   Serratia marcescens NOT DETECTED NOT DETECTED Final   Carbapenem resistance NOT DETECTED NOT DETECTED Final   Haemophilus influenzae NOT DETECTED NOT DETECTED Final   Neisseria meningitidis NOT DETECTED NOT DETECTED Final   Pseudomonas aeruginosa NOT DETECTED NOT DETECTED Final   Candida albicans NOT DETECTED NOT DETECTED Final   Candida glabrata NOT DETECTED NOT DETECTED Final   Candida krusei NOT DETECTED NOT DETECTED Final   Candida parapsilosis NOT DETECTED NOT DETECTED Final   Candida tropicalis NOT DETECTED NOT DETECTED Final    Comment: Performed at [facility]  Blood Culture (routine x 2)     Status: None   Collection Time: 06/01/16  7:54 PM  Result Value Ref Range Status   Specimen Description BLOOD LEFT HAND  Final   Special Requests BOTTLES DRAWN AEROBIC ONLY [place]  Final    Culture NO GROWTH 5 DAYS  Final   Report Status 06/06/2016 FINAL  Final  Urine culture     Status: Abnormal   Collection Time: 06/01/16 10:01 PM  Result Value Ref Range Status   Specimen Description URINE, CLEAN CATCH  Final   Special Requests NONE  Final   Culture >=100,000 COLONIES/mL ESCHERICHIA COLI (A)  Final   Report Status 06/04/2016 FINAL  Final   Organism ID, Bacteria ESCHERICHIA COLI (A)  Final      Susceptibility   Escherichia coli - MIC*    AMPICILLIN >=32 RESISTANT Resistant     CEFAZOLIN 32 INTERMEDIATE Intermediate  CEFTRIAXONE <=1 SENSITIVE Sensitive     CIPROFLOXACIN <=0.25 SENSITIVE Sensitive     GENTAMICIN <=1 SENSITIVE Sensitive     IMIPENEM <=0.25 SENSITIVE Sensitive     NITROFURANTOIN <=16 SENSITIVE Sensitive     TRIMETH/SULFA <=20 SENSITIVE Sensitive     AMPICILLIN/SULBACTAM >=32 RESISTANT Resistant     PIP/TAZO 8 SENSITIVE Sensitive     Extended ESBL NEGATIVE Sensitive     * >=100,000 COLONIES/mL ESCHERICHIA COLI  MRSA PCR Screening     Status: None   Collection Time: 06/02/16  2:50 AM  Result Value Ref Range Status   MRSA by PCR NEGATIVE NEGATIVE Final    Comment:        The GeneXpert MRSA Assay (FDA approved for NASAL specimens only), is one component of a comprehensive MRSA colonization surveillance program. It is not intended to diagnose MRSA infection nor to guide or monitor treatment for MRSA infections.   Culture, blood (Routine X 2) w Reflex to ID Panel     Status: None   Collection Time: 06/02/16  6:55 PM  Result Value Ref Range Status   Specimen Description BLOOD LEFT HAND  Final   Special Requests BOTTLES DRAWN AEROBIC ONLY 5CC  Final   Culture NO GROWTH 5 DAYS  Final   Report Status 06/07/2016 FINAL  Final  Culture, blood (Routine X 2) w Reflex to ID Panel     Status: None   Collection Time: 06/02/16  7:05 PM  Result Value Ref Range Status   Specimen Description BLOOD LEFT ANTECUBITAL  Final   Special Requests BOTTLES DRAWN  AEROBIC AND ANAEROBIC 10 CC EA  Final   Culture NO GROWTH 5 DAYS  Final   Report Status 06/07/2016 FINAL  Final  Surgical PCR screen     Status: Abnormal   Collection Time: 06/08/16 12:34 AM  Result Value Ref Range Status   MRSA, PCR NEGATIVE NEGATIVE Final   Staphylococcus aureus POSITIVE (A) NEGATIVE Final    Comment:        The Xpert SA Assay (FDA approved for NASAL specimens in patients over 36 years of age), is one component of a comprehensive surveillance program.  Test performance has been validated by [facility] for patients greater than or equal to 54 year old. It is not intended to diagnose infection nor to guide or monitor treatment.   Aerobic/Anaerobic Culture (surgical/deep wound)     Status: None (Preliminary result)   Collection Time: 06/08/16  5:04 PM  Result Value Ref Range Status   Specimen Description KNEE LEFT  Final   Special Requests SYNOVIAL  Final   Gram Stain   Final    RARE WBC PRESENT, PREDOMINANTLY PMN NO ORGANISMS SEEN    Culture NO GROWTH 2 DAYS  Final   Report Status PENDING  Incomplete      Assessment/Plan: 3 Days Post-Op   Principal Problem:   Pyogenic arthritis of left knee joint (HCC) Active Problems:   Uncontrolled type 2 diabetes mellitus (HCC)   CAD (coronary artery disease)   Hypertension   UTI (urinary tract infection)   Hypophosphatemia   History of breast cancer   Sepsis ([place])   Cellulitis of breast   Group B streptococcal bacteriuria   Group B streptococcal infection   Sepsis due to group B Streptococcus (HCC)   Acute on chronic combined systolic and diastolic congestive heart failure (HCC)   Acute bacterial endocarditis   Prosthetic joint infection, subsequent encounter   Hyperglycemia   Wheezing  Infection of total knee replacement (HCC)   A/P: 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 Needs outpatient f/u in 2 weeks   Lizette Pazos, Horald Pollen 06/11/2016, 8:01 AM   Rod Can, MD Cell [phone]

## 2016-06-11 NOTE — Progress Notes (Signed)
Patient Name: Toni Hanna Date of Encounter: 06/11/2016  Hospital Problem List     Principal Problem:   Pyogenic arthritis of left knee joint (HCC) Active Problems:   Uncontrolled type 2 diabetes mellitus (HCC)   CAD (coronary artery disease)   Hypertension   UTI (urinary tract infection)   Hypophosphatemia   History of breast cancer   Sepsis ([place])   Cellulitis of breast   Group B streptococcal bacteriuria   Group B streptococcal infection   Sepsis due to group B Streptococcus (HCC)   Acute on chronic combined systolic and diastolic congestive heart failure (HCC)   Acute bacterial endocarditis   Prosthetic joint infection, subsequent encounter   Hyperglycemia   Wheezing   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   Cardiology is following for her cardiac issues which include an echocardiogram demonstrating an ejection fraction of 30-35% with mitral valve repair, mild MR, moderate tricuspid regurgitation with moderately elevated pulmonary pressures.  She did have an echocardiogram in June in [place], where she lives, which demonstrated an EF of 50-55%.  TEE done this admission demonstrated EF 40-45%, small oscillating densities on noncoronary and right aortic cuspsn  Subjective   Breathing better.  Knee pain continues.   Inpatient Medications    . anastrozole  1 mg Oral Daily  . cefTRIAXone (ROCEPHIN)  IV  2 g Intravenous Q24H  . docusate sodium  100 mg Oral BID  . dronedarone  400 mg Oral BID WC  . furosemide  40 mg Intravenous TID  . gentamicin  60 mg Intravenous Q12H  . Influenza vac split quadrivalent PF  0.5 mL Intramuscular Tomorrow-1000  . insulin aspart  0-5 Units Subcutaneous QHS  . insulin aspart  0-9  Units Subcutaneous TID WC  . insulin glargine  18 Units Subcutaneous QHS  . losartan  100 mg Oral Daily  . metoprolol succinate  100 mg Oral Daily  . multivitamin  2 tablet Oral Daily  . mupirocin ointment  1 application Nasal BID  . oxybutynin  10 mg Oral QHS  . pantoprazole  40 mg Oral Daily  . saccharomyces boulardii  250 mg Oral BID  . senna  1 tablet Oral BID  . sodium chloride flush  10-40 mL Intracatheter Q12H  . warfarin  5 mg Oral ONCE-1800  . Warfarin - Pharmacist Dosing Inpatient   Does not apply q1800    Vital Signs    Vitals:   06/11/16 0600 06/11/16 0800 06/11/16 0821 06/11/16 0848  BP: 117/64 140/72  140/72  Pulse: (!) 109 (!) 114 91 94  Resp: 17 (!) 21 20   Temp: 97.9 F (36.6 C) 98 F (36.7 C)    TempSrc: Oral Oral    SpO2: 97% 96% 95%   Weight:      Height:        Intake/Output Summary (Last 24 hours) at 06/11/16 0914 Last data filed at 06/10/16 2329  Gross per 24 hour  Intake             1060 ml  Output             4775 ml  Net            -3715 ml   Danley Danker  Weights   06/01/16 1509 06/10/16 0336 06/11/16 0500  Weight: 182 lb (82.6 kg) 208 lb 1.8 oz (94.4 kg) 205 lb 14.6 oz (93.4 kg)    Physical Exam    GEN: Well nourished, well developed, in  no acute distress.  Neck: Supple, no JVD, carotid bruits, or masses. Cardiac: RRR, no  rubs, or gallops. No clubbing, cyanosis, mild edema.  Radials/DP/PT 2+ and equal bilaterally.  Respiratory:  Respirations  regular and unlabored, bibasilar crackles improved  GI: Soft, nontender, nondistended, BS + x 4. Neuro:  Strength and sensation are intact.   Labs    CBC  Recent Labs  06/09/16 0307 06/10/16 0415  WBC 7.1 8.7  HGB 8.4* 8.8*  HCT 26.7* 27.4*  MCV 88.4 86.7  PLT 175 [id number]   Basic Metabolic Panel  Recent Labs  06/10/16 0415 06/11/16 0400  NA 136 138  K 3.0* 4.0  CL 97* 96*  CO2 30 35*  GLUCOSE 187* 137*  BUN 6 6  CREATININE 0.56 0.67  CALCIUM 8.2* 8.5*   Liver Function Tests No  results for input(s): AST, ALT, ALKPHOS, BILITOT, PROT, ALBUMIN in the last 72 hours. No results for input(s): LIPASE, AMYLASE in the last 72 hours. Cardiac Enzymes No results for input(s): CKTOTAL, CKMB, CKMBINDEX, TROPONINI in the last 72 hours. BNP Invalid input(s): POCBNP D-Dimer No results for input(s): DDIMER in the last 72 hours. Hemoglobin A1C No results for input(s): HGBA1C in the last 72 hours. Fasting Lipid Panel No results for input(s): CHOL, HDL, LDLCALC, TRIG, CHOLHDL, LDLDIRECT in the last 72 hours. Thyroid Function Tests No results for input(s): TSH, T4TOTAL, T3FREE, THYROIDAB in the last 72 hours.  Invalid input(s): FREET3  Telemetry    NSR, sinus tach and atrial tach.  Brief runs of NSVT.    ECG    NA  Radiology    Dg Chest 2 View  Result Date: 06/10/2016 CLINICAL DATA:  Shortness of breath for several days. Pulmonary edema. EXAM: CHEST  2 VIEW COMPARISON:  06/06/2016 FINDINGS: Cardiomegaly. Prior valve replacement. Lingular atelectasis. Airspace opacities increasing in the right upper lobe and right lung base. Cannot exclude pneumonia. Mild interstitial prominence throughout the lungs could reflect mild interstitial edema. Small bilateral effusions. IMPRESSION: Cardiomegaly, possible mild interstitial edema. Small bilateral effusions. Increasing right upper lobe and right basilar airspace opacities, cannot exclude pneumonia. Electronically Signed   By: Rolm Baptise M.D.   On: 06/10/2016 09:41   Dg Chest Port 1 View  Result Date: 06/06/2016 CLINICAL DATA:  Wheezing with shortness of breath and chest pain EXAM: PORTABLE CHEST 1 VIEW COMPARISON:  June 04, 2016 FINDINGS: Central catheter tip remains in the superior vena cava. No pneumothorax. There is patchy bibasilar atelectasis, stable. There is a small left pleural effusion. There is cardiomegaly with mild pulmonary venous hypertension. There is a prosthetic valve present. There is atherosclerotic calcification  aorta. No adenopathy. IMPRESSION: Pulmonary vascular congestion. No frank edema or consolidation. Small left pleural effusion. Stable patchy bibasilar atelectasis. No new opacity evident. Electronically Signed   By: Lowella Grip III M.D.   On: 06/06/2016 11:48   Dg Chest Port 1 View  Result Date: 06/04/2016 CLINICAL DATA:  Left-sided PICC line insertion EXAM: PORTABLE CHEST 1 VIEW COMPARISON:  06/01/2016 FINDINGS: Left-sided PICC line terminates in the distal SVC. Heart is enlarged. There is atelectasis at each lung base and lingula. Subtle confluent pulmonary opacities in the right lung base cannot exclude a small pneumonic consolidation. Aortic atherosclerosis at its arch.  Cardiac valvular prosthetic with median sternotomy sutures are again noted. Surgical clips project over the right mid hemithorax. IMPRESSION: PICC line tip in the distal SVC from left-sided approach. Bibasilar atelectasis again noted. Possible small focus of right basilar pneumonia. Electronically Signed   By: Ashley Royalty M.D.   On: 06/04/2016 17:13   Dg Chest Port 1 View  Result Date: 06/01/2016 CLINICAL DATA:  Sepsis. EXAM: PORTABLE CHEST 1 VIEW COMPARISON:  None. FINDINGS: Borderline enlarged cardiac silhouette. Median sternotomy wires and prosthetic heart valve. Linear density in both lower lung zones. Surgical clips overlying the right mid lung zone. Unremarkable bones. IMPRESSION: Bilateral lower lung zone linear atelectasis or scarring. Electronically Signed   By: Claudie Revering M.D.   On: 06/01/2016 19:50   Dg Knee Left Port  Result Date: 06/08/2016 CLINICAL DATA:  History of infected left total knee. Excisional total knee arthroplasty with antibiotic spacers. EXAM: PORTABLE LEFT KNEE - 1-2 VIEW COMPARISON:  06/01/2016 FINDINGS: Removal of the knee arthroplasty and placement of antibiotic spacers. Femoral and tibial stems are completely visualized. No evidence for a periprosthetic fracture. There appears to be a surgical  drain. Gas in soft tissues related to recent surgery. Normal alignment of the knee. IMPRESSION: Removal of knee arthroplasty and placement of antibiotic spacers. Electronically Signed   By: Markus Daft M.D.   On: 06/08/2016 20:27   Dg Knee Left Port  Result Date: 06/01/2016 CLINICAL DATA:  Pain and swelling since Saturday.  No known injury. EXAM: PORTABLE LEFT KNEE - 1-2 VIEW COMPARISON:  None. FINDINGS: The left knee demonstrates a total knee arthroplasty without evidence of hardware failure complication. There is a small joint effusion. There is no fracture or dislocation. The alignment is anatomic. IMPRESSION: Left total knee arthroplasty without failure or complication. Electronically Signed   By: Kathreen Devoid   On: 06/01/2016 16:22    Assessment & Plan    ACUTE SYSTOLIC HF:   She is still many liters positive since admission. However, she is having good UO.  Today she should take 40 mg Lasix PO bid and 60 meq potassium.  She needs a BMET tomorrow and dose adjustment of her Lasix and potassium by the receiving MD.  I have discussed this at length with her daughter-in-law who is an MD and the patient.    ATRIAL FIB:  She is currently in sinus and we have chosen to continue Multaq. She has sinus tach with PACS.  There was some NSVT and probable runs of atrial flutter vs atrial tach.    ENDOCARDITIS:  We will plan on 6 weeks of IV ceftriaxone and 2 weeks of IV gentamicin with day #1 being Saturday.  She had removal of left knee prosthesis.   HTN:  BP is better.  Continue current meds.   Signed, Minus Breeding, MD  06/11/2016, 9:14 AM

## 2016-06-11 NOTE — Discharge Summary (Signed)
Physician Discharge Summary  Toni Hanna [id number] DOB: [birth year] DOA: 06/01/2016  PCP: Robert Bellow, MD  Admit date: 06/01/2016 Discharge date: 06/11/2016  Admitted From:  Home Disposition:  SNF  Recommendations for Outpatient Follow-up:  1. Follow up with PCP in 1- weeks 2. Please obtain BMP/CBC in 48 hours 3. Please follow-up chest x-ray in 72 hours 4. Antibiotic therapy: Ceftriaxone to complete on 07/18/2016 (6 weeks of therapy) 5.                               Gentamicin to complete on 06/20/2016 (2 weeks of therapy) 6. Please follow-up INR. 7. Close follow-up kidney function and electrolytes. 8. Please remove PICC line after completing antibiotic therapy, last dose of IV antibiotic on November 18 th, 2017.  Home Health: No Equipment/Devices: Not applicable  Discharge Condition: Stable CODE STATUS: Full  Diet recommendation: Heart Healthy / Carb Modified   Brief/Interim Summary: This is a 80 year old lady who has history of breast cancer status post lumpectomy on the right side. Presents with left knee pain, which had been worsening over last 7 days prior to hospitalization. She had a left knee arthroplasty about 10 years ago. Apparently the day prior to admission she fell. On the initial physical examination her blood pressure was 90/70, heart rate was 95-100, oxygen saturation was 96%, she was afebrile. Her mucous membranes were dry, her lungs were clear to auscultation bilaterally, heart S1-S2 present and rhythmic, her abdomen was soft soft and nontender, her left knee had edema, tenderness to palpation, decreased range of motion. Her right breast had erythema without induration. Her serum sodium was 124, potassium 4.1, creatinine 1.35, BUN 25, glucose 559, white count 11.5, hemoglobin 14.2, hematocrit 41.1, platelets 115. Patient had a left knee arthrocentesis with synovial fluid being brown, turbid, 98,000 white cells with 90% neutrophils, Gram stain showed gram-positive  cocci in chains with a culture positive for group B streptococcus. Her urinalysis had too numerous to count white cells.  The patient was admitted with a working diagnosis of sepsis due to septic arthritis/urinary tract infection/right breast cellulitis, complicated by uncontrolled hyperglycemia, acute kidney injury and lactic acidosis. (Peak 5.4).  1. Sepsis (endocarditis, septic arthritis, possibly triggered by mastitis) . Patient was admitted to the stepdown unit, was started on broad-spectrum antibiotics and IV fluids. Cultures from the synovial fluid grew sreptoccus group B, urinary culture showed Escherichia coli. Blood cultures grew group B streptococcus. Follow-up echocardiography showed decreased LV function, transesophageal echocardiography showed small, mobile vegetation on the noncoronary and right cusps of the aortic valve. Patient was placed on ceftriaxone and gentamicin. PICC line was placed. The last positive blood culture was from October 2, follow up cultures from October 3 have no growth. Lactic acid trended down to 1.7 by 06/02/2016  2. New onset heart failure. Systolic dysfunction. Ejection fraction of left ventricle was noted to be 30-35%, apparently echo from June had reported ejection fraction 50-55%. Patient was diuresed with furosemide. Her atrial fibrillation remained rate controlled. Note that patient is status post mitral valve repair in the past. Recommendation to follow-up echocardiogram in 3 months.  3. Paroxysmal atrial fibrillation. Patient will continue with rhythm control with dronedarone and anticoagulation with warfarin, follow INR.   4. Type 2 diabetes mellitus. Patient was placed on insulin sliding scale as well as basal dose of long-acting insulin, over last 24 hours her serum glucose has remained between 116 and 226. For now  continue to hold oral hypoglycemic agents.  5. Acute kidney injury. Patient's renal function improved with supportive care and IV fluids.  Patient developed hypokalemia related to diuresis, potassium has been corrected. Recommendation  for close follow-up on kidney function and electrolytes. Patient on gentamicin IV therapy.   7. Septic arthritis. Patient was diagnosed with left knee periprostatic joint infection, patient was seen by orthopedic service, she underwent resection of left total knee arthroplasty with placement of articulating antibiotic spacer (06/09/2016).   8. Cardiogenic pulmonary edema. Patient was diuresed with improvement of her symptoms. We'll continue to recommend to keep patient on a negative fluid balance. She'll continue on furosemide, oximetry monitoring and supplemental oxygen. Last chest x-ray from 06/06/2016 still show features of pulmonary congestion, right upper lobe and right basilar opacity suspected to be atelectasis. Will recommend to follow-up chest film in 72 hours. Continue incentive spirometer.   9. Normocytic anemia. Hemoglobin ranged between 8-10. Suspect to be multifactorial. No signs of active bleeding. Patient will be continued on the warfarin for her atrial fibrillation    Discharge Diagnoses:  Principal Problem:   Pyogenic arthritis of left knee joint ([place]) Active Problems:   Uncontrolled type 2 diabetes mellitus (HCC)   CAD (coronary artery disease)   Hypertension   UTI (urinary tract infection)   Hypophosphatemia   History of breast cancer   Sepsis ([place])   Cellulitis of breast   Group B streptococcal bacteriuria   Group B streptococcal infection   Sepsis due to group B Streptococcus (HCC)   Acute on chronic combined systolic and diastolic congestive heart failure (HCC)   Acute bacterial endocarditis   Prosthetic joint infection, subsequent encounter   Hyperglycemia   Wheezing   Infection of total knee replacement [facility])    Discharge Instructions  Discharge Instructions    Diet - low sodium heart healthy    Complete by:  As directed    Discharge instructions    Complete  by:  As directed    Please follow with primary care in 7 days.   Increase activity slowly    Complete by:  As directed        Medication List    STOP taking these medications   glipiZIDE 10 MG 24 hr tablet Commonly known as:  GLUCOTROL XL   metFORMIN 500 MG 24 hr tablet Commonly known as:  GLUCOPHAGE-XR   tolterodine 2 MG tablet Commonly known as:  DETROL Replaced by:  oxybutynin 10 MG 24 hr tablet     TAKE these medications   acetaminophen 325 MG tablet Commonly known as:  TYLENOL Take 1 tablet (325 mg total) by mouth every 8 (eight) hours as needed for mild pain (or Fever >/= 101).   anastrozole 1 MG tablet Commonly known as:  ARIMIDEX Take 1 mg by mouth daily.   bisacodyl 10 MG suppository Commonly known as:  DULCOLAX Place 1 suppository (10 mg total) rectally daily as needed for moderate constipation.   cefTRIAXone 2 g in dextrose 5 % 50 mL Inject 2 g into the vein daily.   docusate sodium 100 MG capsule Commonly known as:  COLACE Take 1 capsule (100 mg total) by mouth 2 (two) times daily.   dronedarone 400 MG tablet Commonly known as:  MULTAQ Take 400 mg by mouth 2 (two) times daily with a meal.   furosemide 40 MG tablet Commonly known as:  LASIX Take 40 mg by mouth daily.   gentamicin 1.2-0.9 MG/ML-% Commonly known as:  GARAMYCIN Inject 50  mLs (60 mg total) into the vein every 12 (twelve) hours.   HYDROcodone-acetaminophen 5-325 MG tablet Commonly known as:  NORCO/VICODIN Take 1 tablet by mouth every 6 (six) hours as needed (breakthrough pain).   insulin aspart 100 UNIT/ML injection Commonly known as:  novoLOG Inject 0-5 Units into the skin at bedtime.   insulin glargine 100 UNIT/ML injection Commonly known as:  LANTUS Inject 0.18 mLs (18 Units total) into the skin at bedtime.   losartan 100 MG tablet Commonly known as:  COZAAR Take 100 mg by mouth daily.   metoprolol succinate 100 MG 24 hr tablet Commonly known as:  TOPROL-XL Take 1  tablet (100 mg total) by mouth daily. Take with or immediately following a meal. What changed:  medication strength  how much to take  when to take this  additional instructions   oxybutynin 10 MG 24 hr tablet Commonly known as:  DITROPAN-XL Take 1 tablet (10 mg total) by mouth at bedtime. Replaces:  tolterodine 2 MG tablet   pantoprazole 40 MG tablet Commonly known as:  PROTONIX Take 1 tablet (40 mg total) by mouth daily.   polyethylene glycol packet Commonly known as:  MIRALAX / GLYCOLAX Take 17 g by mouth daily as needed for mild constipation.   potassium chloride 10 MEQ tablet Commonly known as:  K-DUR Take 2 tablets (20 mEq total) by mouth daily.   PRESERVISION AREDS 2+MULTI VIT PO Take 2 tablets by mouth daily.   saccharomyces boulardii 250 MG capsule Commonly known as:  FLORASTOR Take 1 capsule (250 mg total) by mouth 2 (two) times daily.   WARFARIN SODIUM PO Take 5 mg by mouth daily. Dose from [facility], [place], Dr Dierdre Searles at [phone]   zaleplon 5 MG capsule Commonly known as:  SONATA Take 5 mg by mouth at bedtime as needed for sleep.      Follow-up Information    Dr Luiz Iron .   Why:  Appointment Tuesday, 06/16/16, @ 1:45 p.m. Contact information: [facility] [phone]       Primary Care Follow up in 1 week(s).          Allergies  Allergen Reactions  . Amoxicillin Swelling  . Penicillins Swelling    Has patient had a PCN reaction causing immediate rash, facial/tongue/throat swelling, SOB or lightheadedness with hypotension: Yes Has patient had a PCN reaction causing severe rash involving mucus membranes or skin necrosis: No Has patient had a PCN reaction that required hospitalization No Has patient had a PCN reaction occurring within the last 10 years: No If all of the above answers are "NO", then may proceed with Cephalosporin use.  Tolerated ceftriaxone     Consultations:  Infectious  disease  Cardiology  Orthopedic surgery   Procedures/Studies: Dg Chest 2 View  Result Date: 06/10/2016 CLINICAL DATA:  Shortness of breath for several days. Pulmonary edema. EXAM: CHEST  2 VIEW COMPARISON:  06/06/2016 FINDINGS: Cardiomegaly. Prior valve replacement. Lingular atelectasis. Airspace opacities increasing in the right upper lobe and right lung base. Cannot exclude pneumonia. Mild interstitial prominence throughout the lungs could reflect mild interstitial edema. Small bilateral effusions. IMPRESSION: Cardiomegaly, possible mild interstitial edema. Small bilateral effusions. Increasing right upper lobe and right basilar airspace opacities, cannot exclude pneumonia. Electronically Signed   By: Rolm Baptise M.D.   On: 06/10/2016 09:41   Dg Chest Port 1 View  Result Date: 06/06/2016 CLINICAL DATA:  Wheezing with shortness of breath and chest pain EXAM: PORTABLE CHEST 1 VIEW  COMPARISON:  June 04, 2016 FINDINGS: Central catheter tip remains in the superior vena cava. No pneumothorax. There is patchy bibasilar atelectasis, stable. There is a small left pleural effusion. There is cardiomegaly with mild pulmonary venous hypertension. There is a prosthetic valve present. There is atherosclerotic calcification aorta. No adenopathy. IMPRESSION: Pulmonary vascular congestion. No frank edema or consolidation. Small left pleural effusion. Stable patchy bibasilar atelectasis. No new opacity evident. Electronically Signed   By: Lowella Grip III M.D.   On: 06/06/2016 11:48   Dg Chest Port 1 View  Result Date: 06/04/2016 CLINICAL DATA:  Left-sided PICC line insertion EXAM: PORTABLE CHEST 1 VIEW COMPARISON:  06/01/2016 FINDINGS: Left-sided PICC line terminates in the distal SVC. Heart is enlarged. There is atelectasis at each lung base and lingula. Subtle confluent pulmonary opacities in the right lung base cannot exclude a small pneumonic consolidation. Aortic atherosclerosis at its arch. Cardiac  valvular prosthetic with median sternotomy sutures are again noted. Surgical clips project over the right mid hemithorax. IMPRESSION: PICC line tip in the distal SVC from left-sided approach. Bibasilar atelectasis again noted. Possible small focus of right basilar pneumonia. Electronically Signed   By: Ashley Royalty M.D.   On: 06/04/2016 17:13   Dg Chest Port 1 View  Result Date: 06/01/2016 CLINICAL DATA:  Sepsis. EXAM: PORTABLE CHEST 1 VIEW COMPARISON:  None. FINDINGS: Borderline enlarged cardiac silhouette. Median sternotomy wires and prosthetic heart valve. Linear density in both lower lung zones. Surgical clips overlying the right mid lung zone. Unremarkable bones. IMPRESSION: Bilateral lower lung zone linear atelectasis or scarring. Electronically Signed   By: Claudie Revering M.D.   On: 06/01/2016 19:50   Dg Knee Left Port  Result Date: 06/08/2016 CLINICAL DATA:  History of infected left total knee. Excisional total knee arthroplasty with antibiotic spacers. EXAM: PORTABLE LEFT KNEE - 1-2 VIEW COMPARISON:  06/01/2016 FINDINGS: Removal of the knee arthroplasty and placement of antibiotic spacers. Femoral and tibial stems are completely visualized. No evidence for a periprosthetic fracture. There appears to be a surgical drain. Gas in soft tissues related to recent surgery. Normal alignment of the knee. IMPRESSION: Removal of knee arthroplasty and placement of antibiotic spacers. Electronically Signed   By: Markus Daft M.D.   On: 06/08/2016 20:27   Dg Knee Left Port  Result Date: 06/01/2016 CLINICAL DATA:  Pain and swelling since Saturday.  No known injury. EXAM: PORTABLE LEFT KNEE - 1-2 VIEW COMPARISON:  None. FINDINGS: The left knee demonstrates a total knee arthroplasty without evidence of hardware failure complication. There is a small joint effusion. There is no fracture or dislocation. The alignment is anatomic. IMPRESSION: Left total knee arthroplasty without failure or complication. Electronically  Signed   By: Kathreen Devoid   On: 06/01/2016 16:22        Subjective:  Patient is feeling much better, dyspnea has improved, denies any nausea or vomiting,   Discharge Exam: Vitals:   06/11/16 0500 06/11/16 0600  BP: 135/65 117/64  Pulse: 93 (!) 109  Resp: 17 17  Temp:  97.9 F (36.6 C)   Vitals:   06/11/16 0200 06/11/16 0300 06/11/16 0500 06/11/16 0600  BP: 127/63 123/66 135/65 117/64  Pulse: 85 (!) 108 93 (!) 109  Resp: 18 20 17 17   Temp:    97.9 F (36.6 C)  TempSrc:    Oral  SpO2: 96% 96% 95% 97%  Weight:   93.4 kg (205 lb 14.6 oz)   Height:  General: Pt is alert, awake, not in acute distress Cardiovascular: RRR, S1/S2 +, no rubs, no gallops, positive murmur at the parasternal region, 3/6, systolic. Respiratory: CTA bilaterally, no wheezing, no rhonchi Abdominal: Soft, NT, ND, bowel sounds + Extremities:  Positive 1-2+ pitting edema/ lower extremities, no cyanosis    The results of significant diagnostics from this hospitalization (including imaging, microbiology, ancillary and laboratory) are listed below for reference.     Microbiology: Recent Results (from the past 240 hour(s))  Gram stain     Status: None   Collection Time: 06/01/16  4:50 PM  Result Value Ref Range Status   Specimen Description FLUID SYNOVIAL LEFT KNEE  Final   Special Requests NONE  Final   Gram Stain   Final    ABUNDANT WBC PRESENT, PREDOMINANTLY PMN RARE INTRACELLULAR GRAM POSITIVE COCCI Gram Stain Report Called to,Read Back By and Verified With: EDWARDS,C. AT 1804 ON 06/01/2016 BY BAUGHAM,M.    Report Status 06/01/2016 FINAL  Final  Culture, body fluid-bottle     Status: Abnormal   Collection Time: 06/01/16  4:50 PM  Result Value Ref Range Status   Specimen Description FLUID SYNOVIAL LEFT KNEE  Final   Special Requests BOTTLES DRAWN AEROBIC AND ANAEROBIC 3CC EACH  Final   Gram Stain   Final    GRAM POSITIVE COCCI IN CHAINS IN BOTH AEROBIC AND ANAEROBIC BOTTLES Gram Stain  Report Called to,Read Back By and Verified With: REAP B AT [place] AT 0600 ON [id number] BY FORSYTH K Performed at [place] B STREP(S.AGALACTIAE)ISOLATED (A)  Final   Report Status 06/04/2016 FINAL  Final   Organism ID, Bacteria GROUP B STREP(S.AGALACTIAE)ISOLATED  Final      Susceptibility   Group b strep(s.agalactiae)isolated - MIC*    CLINDAMYCIN >=1 RESISTANT Resistant     AMPICILLIN <=0.25 SENSITIVE Sensitive     ERYTHROMYCIN >=8 RESISTANT Resistant     VANCOMYCIN 0.5 SENSITIVE Sensitive     CEFTRIAXONE <=0.12 SENSITIVE Sensitive     LEVOFLOXACIN 1 SENSITIVE Sensitive     * GROUP B STREP(S.AGALACTIAE)ISOLATED  Blood Culture (routine x 2)     Status: Abnormal   Collection Time: 06/01/16  7:42 PM  Result Value Ref Range Status   Specimen Description BLOOD LEFT HAND  Final   Special Requests BOTTLES DRAWN AEROBIC ONLY 6CC  Final   Culture  Setup Time   Final    GRAM POSITIVE COCCI IN CHAINS RECOVERED FROM THE AEROBIC BOTTLE Gram Stain Report Called to,Read Back By and Verified With: AGUIRRE,A. AT [id number] ON 06/02/2016 BY BAUGHAM,M. Performed at [facility] ID to follow CRITICAL RESULT CALLED TO, READ BACK BY AND VERIFIED WITH: G ABBOTT PHARMD 2300 06/02/16 A BROWNING Performed at [place] [facility] B STREP(S.AGALACTIAE)ISOLATED (A)  Final   Report Status 06/04/2016 FINAL  Final   Organism ID, Bacteria GROUP B STREP(S.AGALACTIAE)ISOLATED  Final      Susceptibility   Group b strep(s.agalactiae)isolated - MIC*    CLINDAMYCIN >=1 RESISTANT Resistant     AMPICILLIN <=0.25 SENSITIVE Sensitive     ERYTHROMYCIN >=8 RESISTANT Resistant     VANCOMYCIN 0.5 SENSITIVE Sensitive     CEFTRIAXONE <=0.12 SENSITIVE Sensitive     LEVOFLOXACIN 1 SENSITIVE Sensitive     * GROUP B STREP(S.AGALACTIAE)ISOLATED  Blood Culture ID Panel (Reflexed)     Status: Abnormal   Collection Time: 06/01/16  7:42 PM  Result Value Ref Range Status  Enterococcus  species NOT DETECTED NOT DETECTED Final   Vancomycin resistance NOT DETECTED NOT DETECTED Final   Listeria monocytogenes NOT DETECTED NOT DETECTED Final   Staphylococcus species NOT DETECTED NOT DETECTED Final   Staphylococcus aureus NOT DETECTED NOT DETECTED Final   Methicillin resistance NOT DETECTED NOT DETECTED Final   Streptococcus species DETECTED (A) NOT DETECTED Final    Comment: CRITICAL RESULT CALLED TO, READ BACK BY AND VERIFIED WITH: G ABBOTT PHARMD 2300 06/02/16 A BROWNING    Streptococcus agalactiae DETECTED (A) NOT DETECTED Final    Comment: CRITICAL RESULT CALLED TO, READ BACK BY AND VERIFIED WITH: G ABBOTT PHARMD 2300 06/02/16 A BROWNING    Streptococcus pneumoniae NOT DETECTED NOT DETECTED Final   Streptococcus pyogenes NOT DETECTED NOT DETECTED Final   Acinetobacter baumannii NOT DETECTED NOT DETECTED Final   Enterobacteriaceae species NOT DETECTED NOT DETECTED Final   Enterobacter cloacae complex NOT DETECTED NOT DETECTED Final   Escherichia coli NOT DETECTED NOT DETECTED Final   Klebsiella oxytoca NOT DETECTED NOT DETECTED Final   Klebsiella pneumoniae NOT DETECTED NOT DETECTED Final   Proteus species NOT DETECTED NOT DETECTED Final   Serratia marcescens NOT DETECTED NOT DETECTED Final   Carbapenem resistance NOT DETECTED NOT DETECTED Final   Haemophilus influenzae NOT DETECTED NOT DETECTED Final   Neisseria meningitidis NOT DETECTED NOT DETECTED Final   Pseudomonas aeruginosa NOT DETECTED NOT DETECTED Final   Candida albicans NOT DETECTED NOT DETECTED Final   Candida glabrata NOT DETECTED NOT DETECTED Final   Candida krusei NOT DETECTED NOT DETECTED Final   Candida parapsilosis NOT DETECTED NOT DETECTED Final   Candida tropicalis NOT DETECTED NOT DETECTED Final    Comment: Performed at [facility]  Blood Culture (routine x 2)     Status: None   Collection Time: 06/01/16  7:54 PM  Result Value Ref Range Status   Specimen Description BLOOD LEFT HAND   Final   Special Requests BOTTLES DRAWN AEROBIC ONLY 6CC  Final   Culture NO GROWTH 5 DAYS  Final   Report Status 06/06/2016 FINAL  Final  Urine culture     Status: Abnormal   Collection Time: 06/01/16 10:01 PM  Result Value Ref Range Status   Specimen Description URINE, CLEAN CATCH  Final   Special Requests NONE  Final   Culture >=100,000 COLONIES/mL ESCHERICHIA COLI (A)  Final   Report Status 06/04/2016 FINAL  Final   Organism ID, Bacteria ESCHERICHIA COLI (A)  Final      Susceptibility   Escherichia coli - MIC*    AMPICILLIN >=32 RESISTANT Resistant     CEFAZOLIN 32 INTERMEDIATE Intermediate     CEFTRIAXONE <=1 SENSITIVE Sensitive     CIPROFLOXACIN <=0.25 SENSITIVE Sensitive     GENTAMICIN <=1 SENSITIVE Sensitive     IMIPENEM <=0.25 SENSITIVE Sensitive     NITROFURANTOIN <=16 SENSITIVE Sensitive     TRIMETH/SULFA <=20 SENSITIVE Sensitive     AMPICILLIN/SULBACTAM >=32 RESISTANT Resistant     PIP/TAZO 8 SENSITIVE Sensitive     Extended ESBL NEGATIVE Sensitive     * >=100,000 COLONIES/mL ESCHERICHIA COLI  MRSA PCR Screening     Status: None   Collection Time: 06/02/16  2:50 AM  Result Value Ref Range Status   MRSA by PCR NEGATIVE NEGATIVE Final    Comment:        The GeneXpert MRSA Assay (FDA approved for NASAL specimens only), is one component of a comprehensive MRSA colonization surveillance program. It is not  intended to diagnose MRSA infection nor to guide or monitor treatment for MRSA infections.   Culture, blood (Routine X 2) w Reflex to ID Panel     Status: None   Collection Time: 06/02/16  6:55 PM  Result Value Ref Range Status   Specimen Description BLOOD LEFT HAND  Final   Special Requests BOTTLES DRAWN AEROBIC ONLY 5CC  Final   Culture NO GROWTH 5 DAYS  Final   Report Status 06/07/2016 FINAL  Final  Culture, blood (Routine X 2) w Reflex to ID Panel     Status: None   Collection Time: 06/02/16  7:05 PM  Result Value Ref Range Status   Specimen Description  BLOOD LEFT ANTECUBITAL  Final   Special Requests BOTTLES DRAWN AEROBIC AND ANAEROBIC 10 CC EA  Final   Culture NO GROWTH 5 DAYS  Final   Report Status 06/07/2016 FINAL  Final  Surgical PCR screen     Status: Abnormal   Collection Time: 06/08/16 12:34 AM  Result Value Ref Range Status   MRSA, PCR NEGATIVE NEGATIVE Final   Staphylococcus aureus POSITIVE (A) NEGATIVE Final    Comment:        The Xpert SA Assay (FDA approved for NASAL specimens in patients over 20 years of age), is one component of a comprehensive surveillance program.  Test performance has been validated by [facility] for patients greater than or equal to 45 year old. It is not intended to diagnose infection nor to guide or monitor treatment.   Aerobic/Anaerobic Culture (surgical/deep wound)     Status: None (Preliminary result)   Collection Time: 06/08/16  5:04 PM  Result Value Ref Range Status   Specimen Description KNEE LEFT  Final   Special Requests SYNOVIAL  Final   Gram Stain   Final    RARE WBC PRESENT, PREDOMINANTLY PMN NO ORGANISMS SEEN    Culture NO GROWTH 2 DAYS  Final   Report Status PENDING  Incomplete     Labs: BNP (last 3 results) No results for input(s): BNP in the last 8760 hours. Basic Metabolic Panel:  Recent Labs Lab 06/07/16 0600 06/08/16 0637 06/09/16 0307 06/10/16 0415 06/11/16 0400  NA 137 137 137 136 138  K 3.3* 3.5 3.7 3.0* 4.0  CL 102 102 100* 97* 96*  CO2 25 27 27 30  35*  GLUCOSE 167* 146* 191* 187* 137*  BUN <5* <5* <5* 6 6  CREATININE 0.55 0.57 0.64 0.56 0.67  CALCIUM 8.4* 8.2* 8.1* 8.2* 8.5*  MG 1.3*  --   --   --   --   PHOS 2.6  --   --   --   --    Liver Function Tests: No results for input(s): AST, ALT, ALKPHOS, BILITOT, PROT, ALBUMIN in the last 168 hours. No results for input(s): LIPASE, AMYLASE in the last 168 hours. No results for input(s): AMMONIA in the last 168 hours. CBC:  Recent Labs Lab 06/06/16 0416 06/07/16 0600 06/08/16 0637  06/09/16 0307 06/10/16 0415  WBC 6.9 6.2 6.9 7.1 8.7  HGB 11.0* 9.9* 10.0* 8.4* 8.8*  HCT 34.1* 31.0* 31.1* 26.7* 27.4*  MCV 87.2 87.3 88.1 88.4 86.7  PLT 176 187 190 175 205   Cardiac Enzymes: No results for input(s): CKTOTAL, CKMB, CKMBINDEX, TROPONINI in the last 168 hours. BNP: Invalid input(s): POCBNP CBG:  Recent Labs Lab 06/09/16 2204 06/10/16 0824 06/10/16 1243 06/10/16 1645 06/10/16 2133  GLUCAP 250* 190* 226* 224* 177*   D-Dimer No results  for input(s): DDIMER in the last 72 hours. Hgb A1c No results for input(s): HGBA1C in the last 72 hours. Lipid Profile No results for input(s): CHOL, HDL, LDLCALC, TRIG, CHOLHDL, LDLDIRECT in the last 72 hours. Thyroid function studies No results for input(s): TSH, T4TOTAL, T3FREE, THYROIDAB in the last 72 hours.  Invalid input(s): FREET3 Anemia work up No results for input(s): VITAMINB12, FOLATE, FERRITIN, TIBC, IRON, RETICCTPCT in the last 72 hours. Urinalysis    Component Value Date/Time   COLORURINE YELLOW 06/01/2016 2201   APPEARANCEUR HAZY (A) 06/01/2016 2201   LABSPEC 1.025 06/01/2016 2201   PHURINE 5.5 06/01/2016 2201   GLUCOSEU >1000 (A) 06/01/2016 2201   HGBUR MODERATE (A) 06/01/2016 2201   BILIRUBINUR NEGATIVE 06/01/2016 2201   KETONESUR NEGATIVE 06/01/2016 2201   PROTEINUR 30 (A) 06/01/2016 2201   NITRITE NEGATIVE 06/01/2016 2201   LEUKOCYTESUR SMALL (A) 06/01/2016 2201   Sepsis Labs Invalid input(s): PROCALCITONIN,  WBC,  LACTICIDVEN Microbiology Recent Results (from the past 240 hour(s))  Gram stain     Status: None   Collection Time: 06/01/16  4:50 PM  Result Value Ref Range Status   Specimen Description FLUID SYNOVIAL LEFT KNEE  Final   Special Requests NONE  Final   Gram Stain   Final    ABUNDANT WBC PRESENT, PREDOMINANTLY PMN RARE INTRACELLULAR GRAM POSITIVE COCCI Gram Stain Report Called to,Read Back By and Verified With: EDWARDS,C. AT 1804 ON 06/01/2016 BY BAUGHAM,M.    Report Status  06/01/2016 FINAL  Final  Culture, body fluid-bottle     Status: Abnormal   Collection Time: 06/01/16  4:50 PM  Result Value Ref Range Status   Specimen Description FLUID SYNOVIAL LEFT KNEE  Final   Special Requests BOTTLES DRAWN AEROBIC AND ANAEROBIC 3CC EACH  Final   Gram Stain   Final    GRAM POSITIVE COCCI IN CHAINS IN BOTH AEROBIC AND ANAEROBIC BOTTLES Gram Stain Report Called to,Read Back By and Verified With: REAP B AT [place] AT 0600 ON [id number] BY FORSYTH K Performed at [place] B STREP(S.AGALACTIAE)ISOLATED (A)  Final   Report Status 06/04/2016 FINAL  Final   Organism ID, Bacteria GROUP B STREP(S.AGALACTIAE)ISOLATED  Final      Susceptibility   Group b strep(s.agalactiae)isolated - MIC*    CLINDAMYCIN >=1 RESISTANT Resistant     AMPICILLIN <=0.25 SENSITIVE Sensitive     ERYTHROMYCIN >=8 RESISTANT Resistant     VANCOMYCIN 0.5 SENSITIVE Sensitive     CEFTRIAXONE <=0.12 SENSITIVE Sensitive     LEVOFLOXACIN 1 SENSITIVE Sensitive     * GROUP B STREP(S.AGALACTIAE)ISOLATED  Blood Culture (routine x 2)     Status: Abnormal   Collection Time: 06/01/16  7:42 PM  Result Value Ref Range Status   Specimen Description BLOOD LEFT HAND  Final   Special Requests BOTTLES DRAWN AEROBIC ONLY 6CC  Final   Culture  Setup Time   Final    GRAM POSITIVE COCCI IN CHAINS RECOVERED FROM THE AEROBIC BOTTLE Gram Stain Report Called to,Read Back By and Verified With: AGUIRRE,A. AT [id number] ON 06/02/2016 BY BAUGHAM,M. Performed at [place] ID to follow CRITICAL RESULT CALLED TO, READ BACK BY AND VERIFIED WITH: Denton Brick PHARMD 2300 06/02/16 A BROWNING Performed at [place] B STREP(S.AGALACTIAE)ISOLATED (A)  Final   Report Status 06/04/2016 FINAL  Final   Organism ID, Bacteria GROUP B STREP(S.AGALACTIAE)ISOLATED  Final      Susceptibility  Group b strep(s.agalactiae)isolated - MIC*    CLINDAMYCIN >=1 RESISTANT Resistant     AMPICILLIN  <=0.25 SENSITIVE Sensitive     ERYTHROMYCIN >=8 RESISTANT Resistant     VANCOMYCIN 0.5 SENSITIVE Sensitive     CEFTRIAXONE <=0.12 SENSITIVE Sensitive     LEVOFLOXACIN 1 SENSITIVE Sensitive     * GROUP B STREP(S.AGALACTIAE)ISOLATED  Blood Culture ID Panel (Reflexed)     Status: Abnormal   Collection Time: 06/01/16  7:42 PM  Result Value Ref Range Status   Enterococcus species NOT DETECTED NOT DETECTED Final   Vancomycin resistance NOT DETECTED NOT DETECTED Final   Listeria monocytogenes NOT DETECTED NOT DETECTED Final   Staphylococcus species NOT DETECTED NOT DETECTED Final   Staphylococcus aureus NOT DETECTED NOT DETECTED Final   Methicillin resistance NOT DETECTED NOT DETECTED Final   Streptococcus species DETECTED (A) NOT DETECTED Final    Comment: CRITICAL RESULT CALLED TO, READ BACK BY AND VERIFIED WITH: G ABBOTT PHARMD 2300 06/02/16 A BROWNING    Streptococcus agalactiae DETECTED (A) NOT DETECTED Final    Comment: CRITICAL RESULT CALLED TO, READ BACK BY AND VERIFIED WITH: G ABBOTT PHARMD 2300 06/02/16 A BROWNING    Streptococcus pneumoniae NOT DETECTED NOT DETECTED Final   Streptococcus pyogenes NOT DETECTED NOT DETECTED Final   Acinetobacter baumannii NOT DETECTED NOT DETECTED Final   Enterobacteriaceae species NOT DETECTED NOT DETECTED Final   Enterobacter cloacae complex NOT DETECTED NOT DETECTED Final   Escherichia coli NOT DETECTED NOT DETECTED Final   Klebsiella oxytoca NOT DETECTED NOT DETECTED Final   Klebsiella pneumoniae NOT DETECTED NOT DETECTED Final   Proteus species NOT DETECTED NOT DETECTED Final   Serratia marcescens NOT DETECTED NOT DETECTED Final   Carbapenem resistance NOT DETECTED NOT DETECTED Final   Haemophilus influenzae NOT DETECTED NOT DETECTED Final   Neisseria meningitidis NOT DETECTED NOT DETECTED Final   Pseudomonas aeruginosa NOT DETECTED NOT DETECTED Final   Candida albicans NOT DETECTED NOT DETECTED Final   Candida glabrata NOT DETECTED NOT  DETECTED Final   Candida krusei NOT DETECTED NOT DETECTED Final   Candida parapsilosis NOT DETECTED NOT DETECTED Final   Candida tropicalis NOT DETECTED NOT DETECTED Final    Comment: Performed at [facility]  Blood Culture (routine x 2)     Status: None   Collection Time: 06/01/16  7:54 PM  Result Value Ref Range Status   Specimen Description BLOOD LEFT HAND  Final   Special Requests BOTTLES DRAWN AEROBIC ONLY 6CC  Final   Culture NO GROWTH 5 DAYS  Final   Report Status 06/06/2016 FINAL  Final  Urine culture     Status: Abnormal   Collection Time: 06/01/16 10:01 PM  Result Value Ref Range Status   Specimen Description URINE, CLEAN CATCH  Final   Special Requests NONE  Final   Culture >=100,000 COLONIES/mL ESCHERICHIA COLI (A)  Final   Report Status 06/04/2016 FINAL  Final   Organism ID, Bacteria ESCHERICHIA COLI (A)  Final      Susceptibility   Escherichia coli - MIC*    AMPICILLIN >=32 RESISTANT Resistant     CEFAZOLIN 32 INTERMEDIATE Intermediate     CEFTRIAXONE <=1 SENSITIVE Sensitive     CIPROFLOXACIN <=0.25 SENSITIVE Sensitive     GENTAMICIN <=1 SENSITIVE Sensitive     IMIPENEM <=0.25 SENSITIVE Sensitive     NITROFURANTOIN <=16 SENSITIVE Sensitive     TRIMETH/SULFA <=20 SENSITIVE Sensitive     AMPICILLIN/SULBACTAM >=32 RESISTANT Resistant  PIP/TAZO 8 SENSITIVE Sensitive     Extended ESBL NEGATIVE Sensitive     * >=100,000 COLONIES/mL ESCHERICHIA COLI  MRSA PCR Screening     Status: None   Collection Time: 06/02/16  2:50 AM  Result Value Ref Range Status   MRSA by PCR NEGATIVE NEGATIVE Final    Comment:        The GeneXpert MRSA Assay (FDA approved for NASAL specimens only), is one component of a comprehensive MRSA colonization surveillance program. It is not intended to diagnose MRSA infection nor to guide or monitor treatment for MRSA infections.   Culture, blood (Routine X 2) w Reflex to ID Panel     Status: None   Collection Time: 06/02/16  6:55  PM  Result Value Ref Range Status   Specimen Description BLOOD LEFT HAND  Final   Special Requests BOTTLES DRAWN AEROBIC ONLY 5CC  Final   Culture NO GROWTH 5 DAYS  Final   Report Status 06/07/2016 FINAL  Final  Culture, blood (Routine X 2) w Reflex to ID Panel     Status: None   Collection Time: 06/02/16  7:05 PM  Result Value Ref Range Status   Specimen Description BLOOD LEFT ANTECUBITAL  Final   Special Requests BOTTLES DRAWN AEROBIC AND ANAEROBIC 10 CC EA  Final   Culture NO GROWTH 5 DAYS  Final   Report Status 06/07/2016 FINAL  Final  Surgical PCR screen     Status: Abnormal   Collection Time: 06/08/16 12:34 AM  Result Value Ref Range Status   MRSA, PCR NEGATIVE NEGATIVE Final   Staphylococcus aureus POSITIVE (A) NEGATIVE Final    Comment:        The Xpert SA Assay (FDA approved for NASAL specimens in patients over 83 years of age), is one component of a comprehensive surveillance program.  Test performance has been validated by [facility] for patients greater than or equal to 19 year old. It is not intended to diagnose infection nor to guide or monitor treatment.   Aerobic/Anaerobic Culture (surgical/deep wound)     Status: None (Preliminary result)   Collection Time: 06/08/16  5:04 PM  Result Value Ref Range Status   Specimen Description KNEE LEFT  Final   Special Requests SYNOVIAL  Final   Gram Stain   Final    RARE WBC PRESENT, PREDOMINANTLY PMN NO ORGANISMS SEEN    Culture NO GROWTH 2 DAYS  Final   Report Status PENDING  Incomplete     Time coordinating discharge: Over 45 minutes  SIGNED:   Tawni Millers, MD  [facility] 06/11/2016, 8:03 AM Pager   If 7PM-7AM, please contact night-coverage [email or web address] Password [email or web address]

## 2016-06-11 NOTE — Progress Notes (Signed)
ANTICOAGULATION CONSULT NOTE - Follow Up Consult  Pharmacy Consult:  warfarin Indication: atrial fibrillation   Patient Measurements: Height: 5\' 3"  (160 cm) Weight: 205 lb 14.6 oz (93.4 kg) IBW/kg (Calculated) : 52.4 Heparin Dosing Weight: 71 kg  Vital Signs: Temp: 97.9 F (36.6 C) (10/12 0600) Temp Source: Oral (10/12 0600) BP: 117/64 (10/12 0600) Pulse Rate: 91 (10/12 0821)  Labs:  Recent Labs  06/09/16 0307 06/09/16 1046 06/10/16 0415 06/11/16 0400  HGB 8.4*  --  8.8*  --   HCT 26.7*  --  27.4*  --   PLT 175  --  205  --   LABPROT  --  19.8* 26.8* 27.0*  INR  --  1.66 2.42 2.45  CREATININE 0.64  --  0.56 0.67    Estimated Creatinine Clearance: 62.9 mL/min (by C-G formula based on SCr of 0.67 mg/dL).   Assessment: 57 YOF with history of afib (CHADsVASc = 5) and MVrepair, on coumadin PTA. Now s/p TKA and antibiotic spacers on 10/9   INR on admit was 3.15 and s/p vitamin K x2 doses.   Home coumadin dose: 5mg /day  Patient now  back to warfarin s/p enoxaparin 40 mg q24h bridge INR this am 2.45  Goal of Therapy:  INR 2-3 Monitor platelets by anticoagulation protocol: Yes  Plan:  Warfarin 5 mg x 1 Daily INR  Levester Fresh, PharmD, BCPS, [facility] Clinical Pharmacist Pager [phone] 06/11/2016 8:26 AM

## 2016-06-12 LAB — TYPE AND SCREEN
ABO/RH(D): A POS
Antibody Screen: NEGATIVE
UNIT DIVISION: 0
Unit division: 0

## 2016-06-13 LAB — AEROBIC/ANAEROBIC CULTURE (SURGICAL/DEEP WOUND)

## 2016-06-13 LAB — AEROBIC/ANAEROBIC CULTURE W GRAM STAIN (SURGICAL/DEEP WOUND): Culture: NO GROWTH

## 2017-03-26 IMAGING — CR DG KNEE 1-2V PORT*L*
4 series · 4 of 4 positions shown · non-contrast
Comparison: 06/01/2016

CLINICAL DATA: History of infected left total knee. Excisional
total knee arthroplasty with antibiotic spacers.

EXAM:
PORTABLE LEFT KNEE - 1-2 VIEW

[AP (1 of 2)]
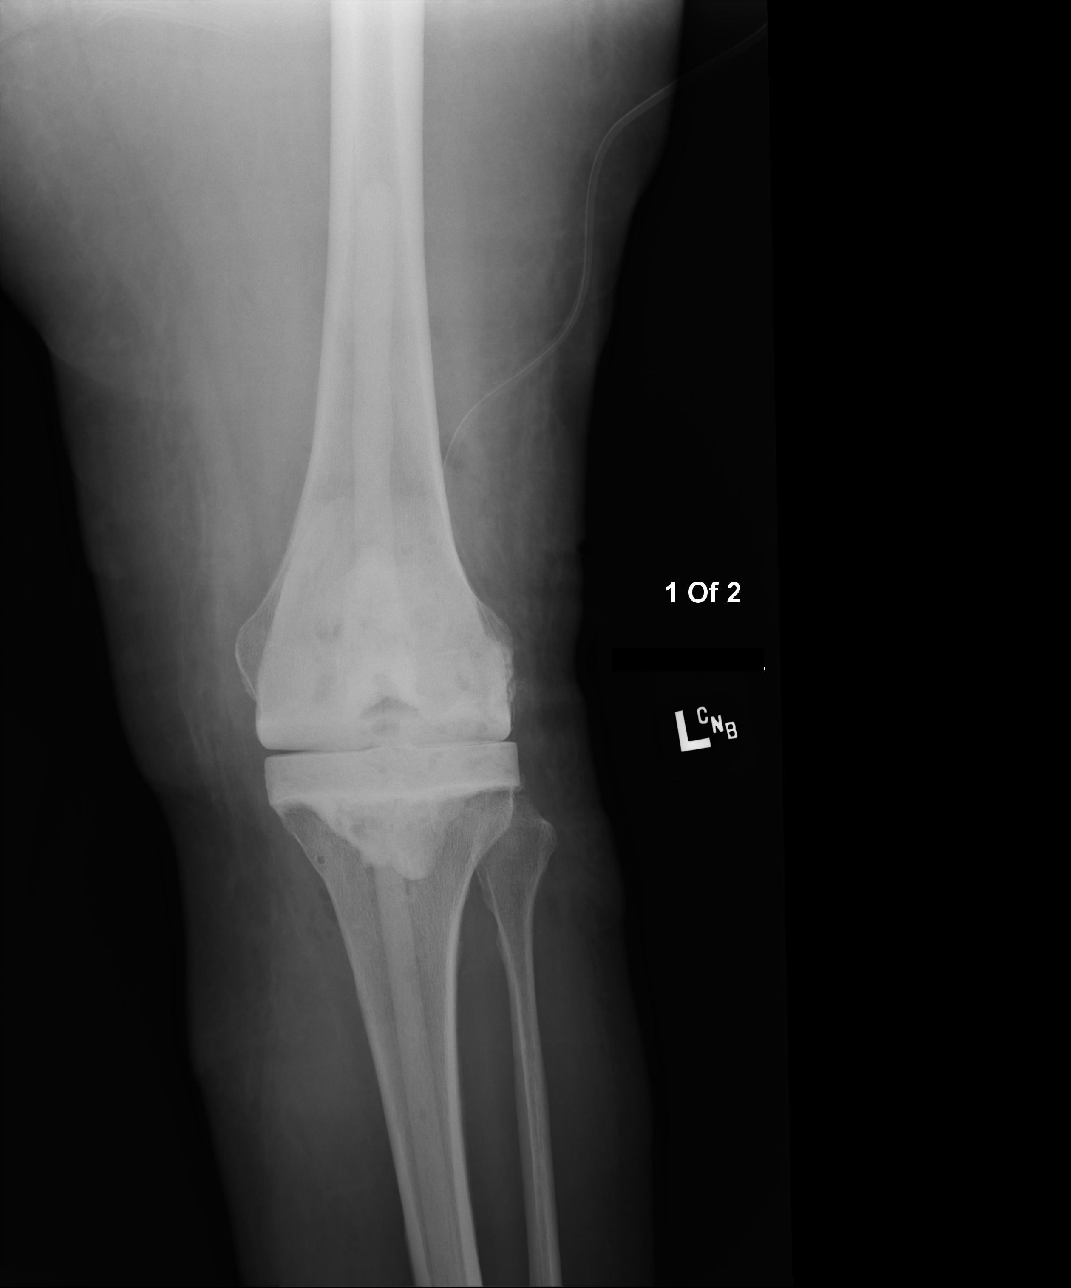

[xtable lateral (1 of 2)]
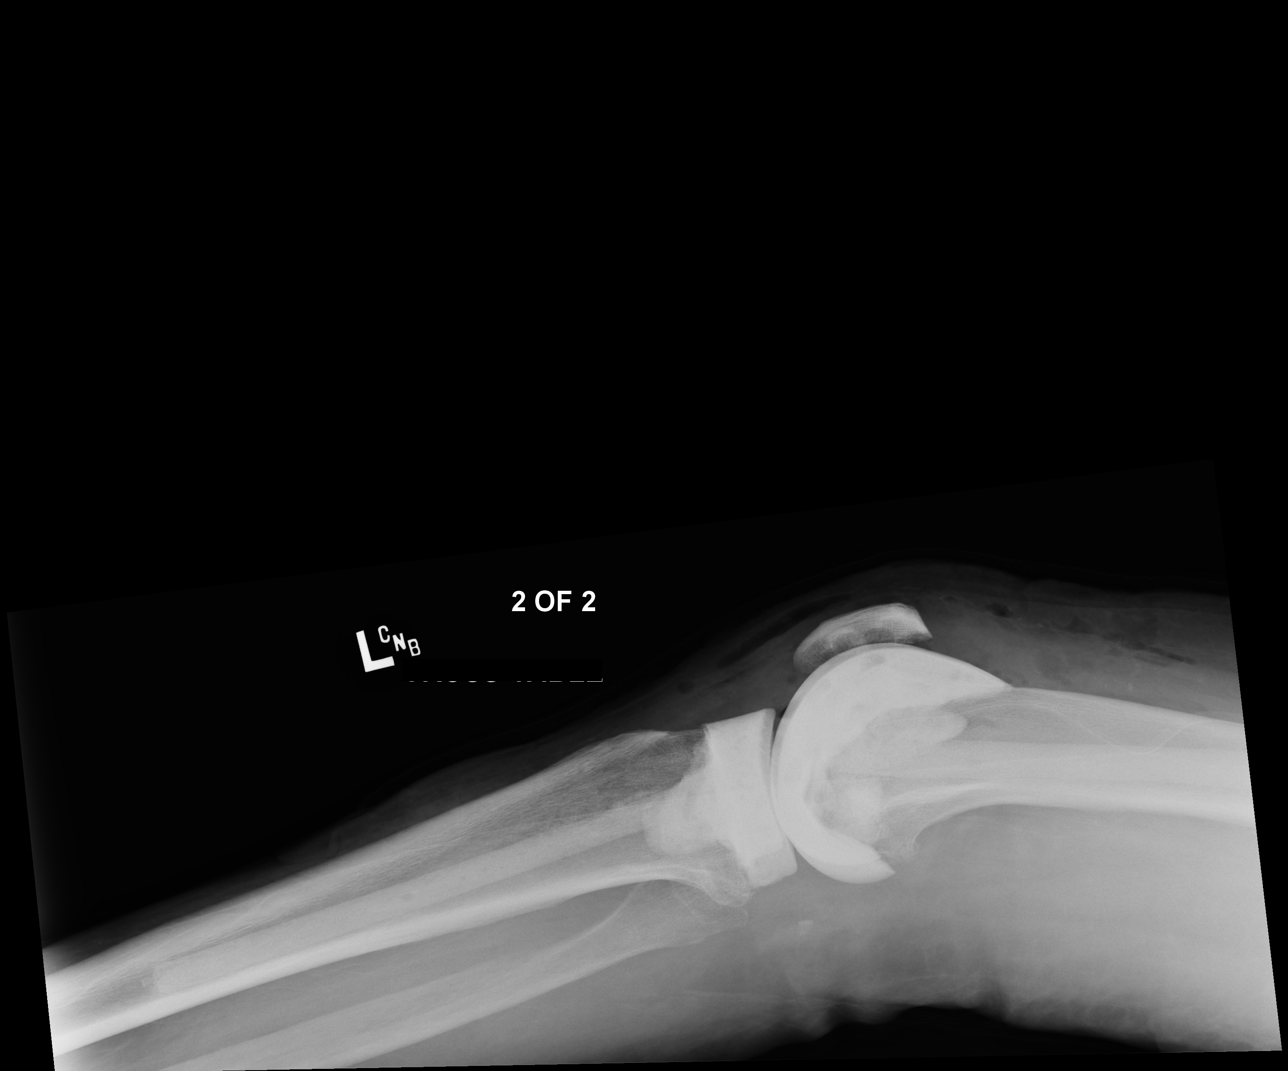

[AP (2 of 2)]
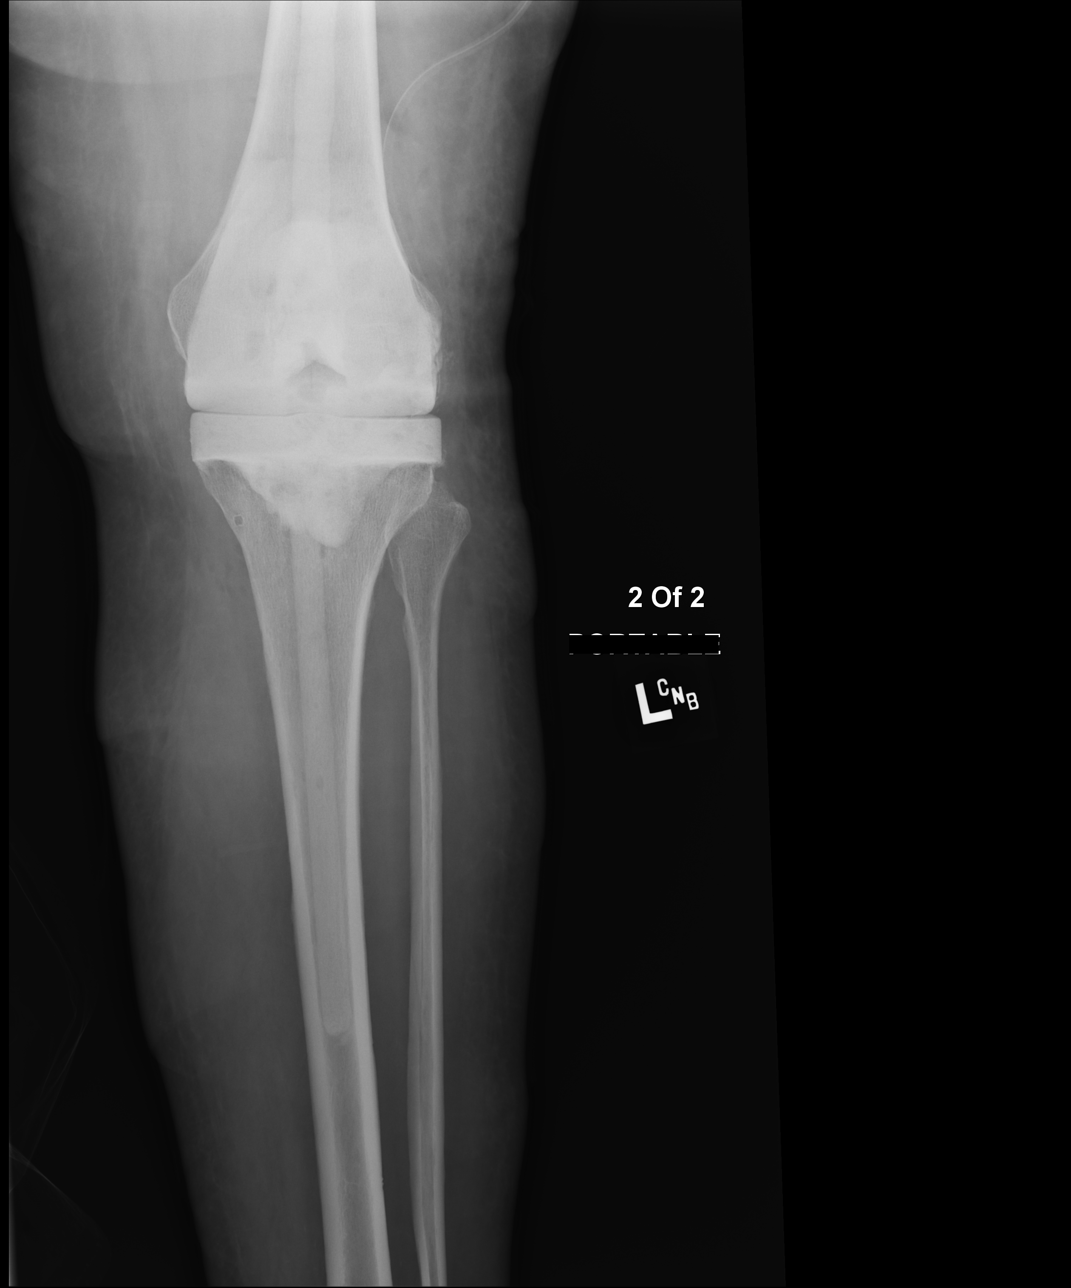

[xtable lateral (2 of 2)]
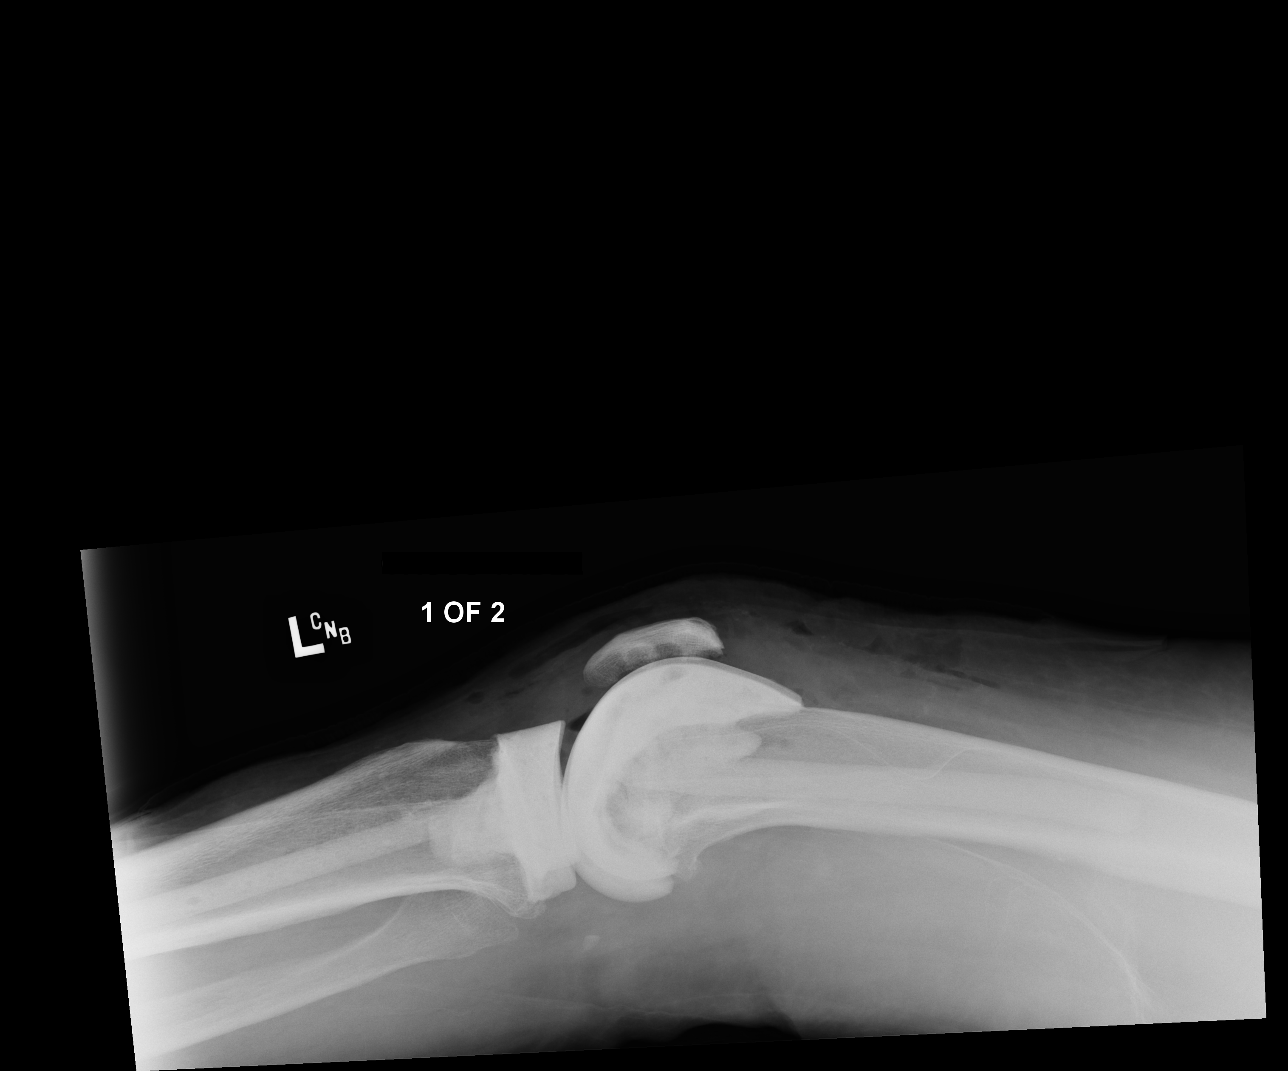

[4 of 4 positions shown; findings below may reference images not displayed]

FINDINGS: Removal of the knee arthroplasty and placement of antibiotic
spacers. Femoral and tibial stems are completely visualized. No
evidence for a periprosthetic fracture. There appears to be a
surgical drain. Gas in soft tissues related to recent surgery.
Normal alignment of the knee.
IMPRESSION: Removal of knee arthroplasty and placement of antibiotic spacers.

## 2017-03-28 IMAGING — DX DG CHEST 2V
2 series · 2 of 2 positions shown · non-contrast
Comparison: 06/06/2016

CLINICAL DATA: Shortness of breath for several days. Pulmonary
edema.

EXAM:
CHEST  2 VIEW

[x chest ap]
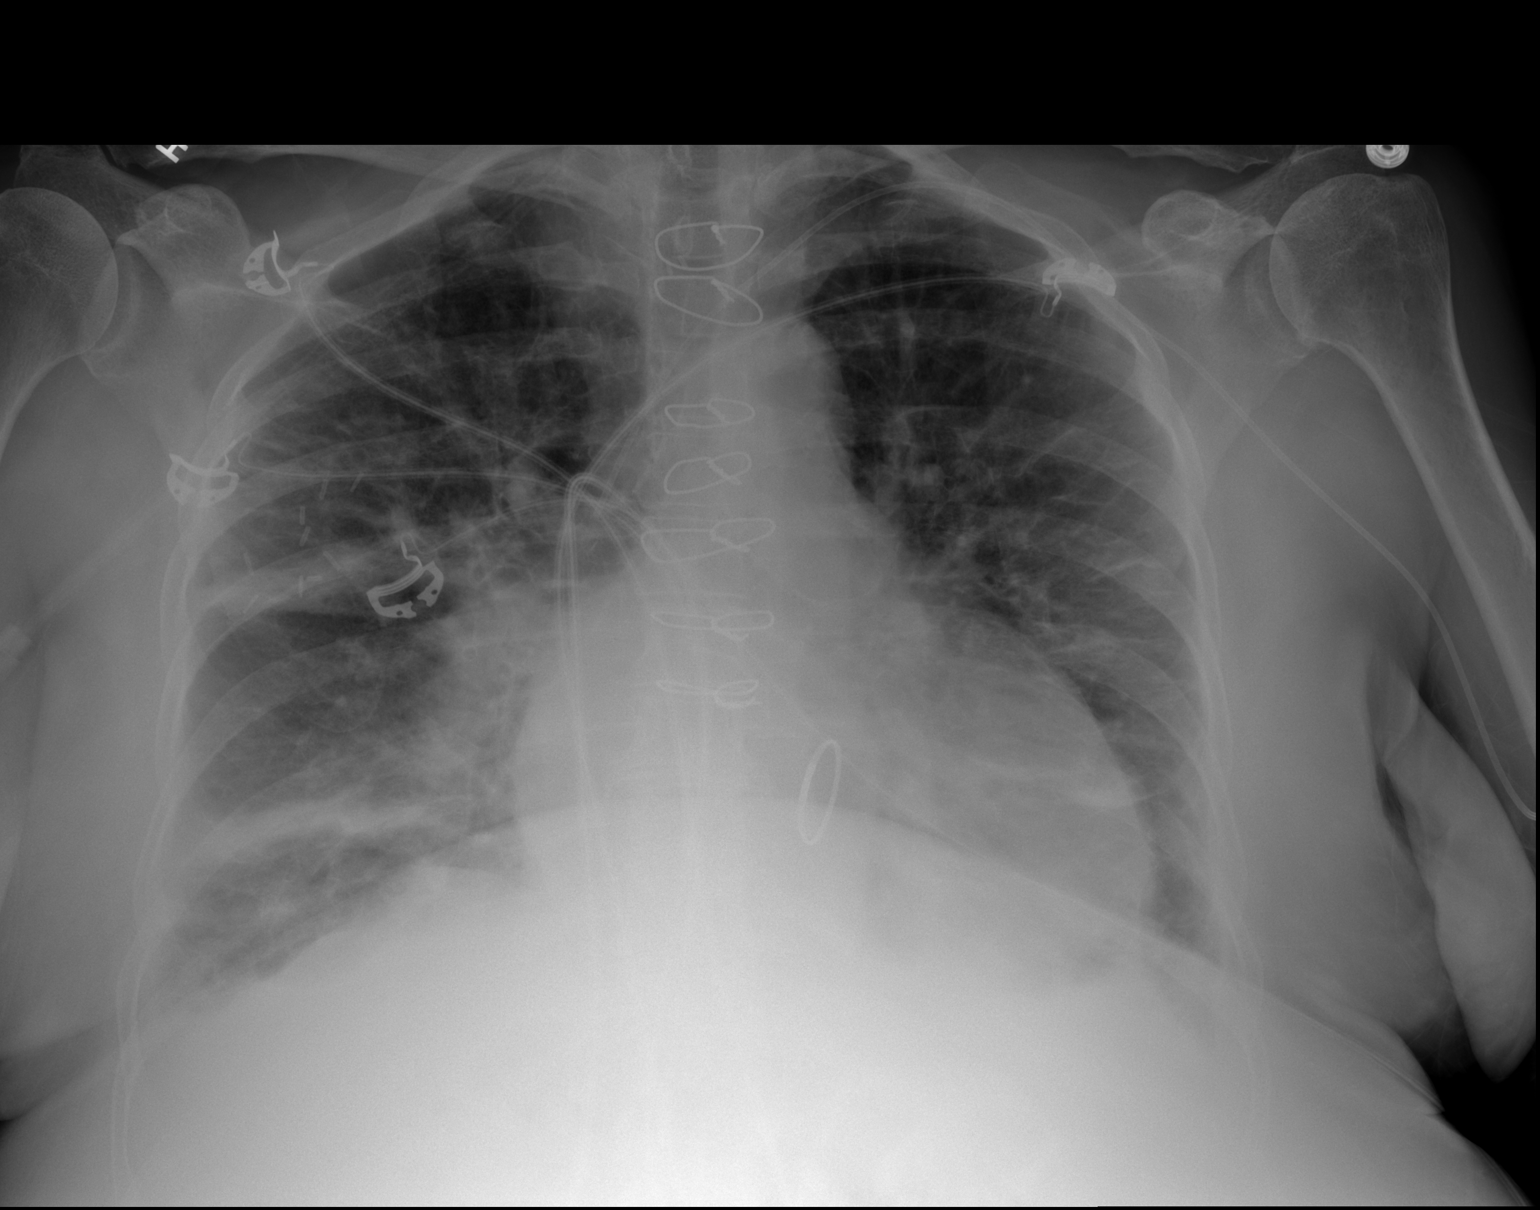

[w chest lat]
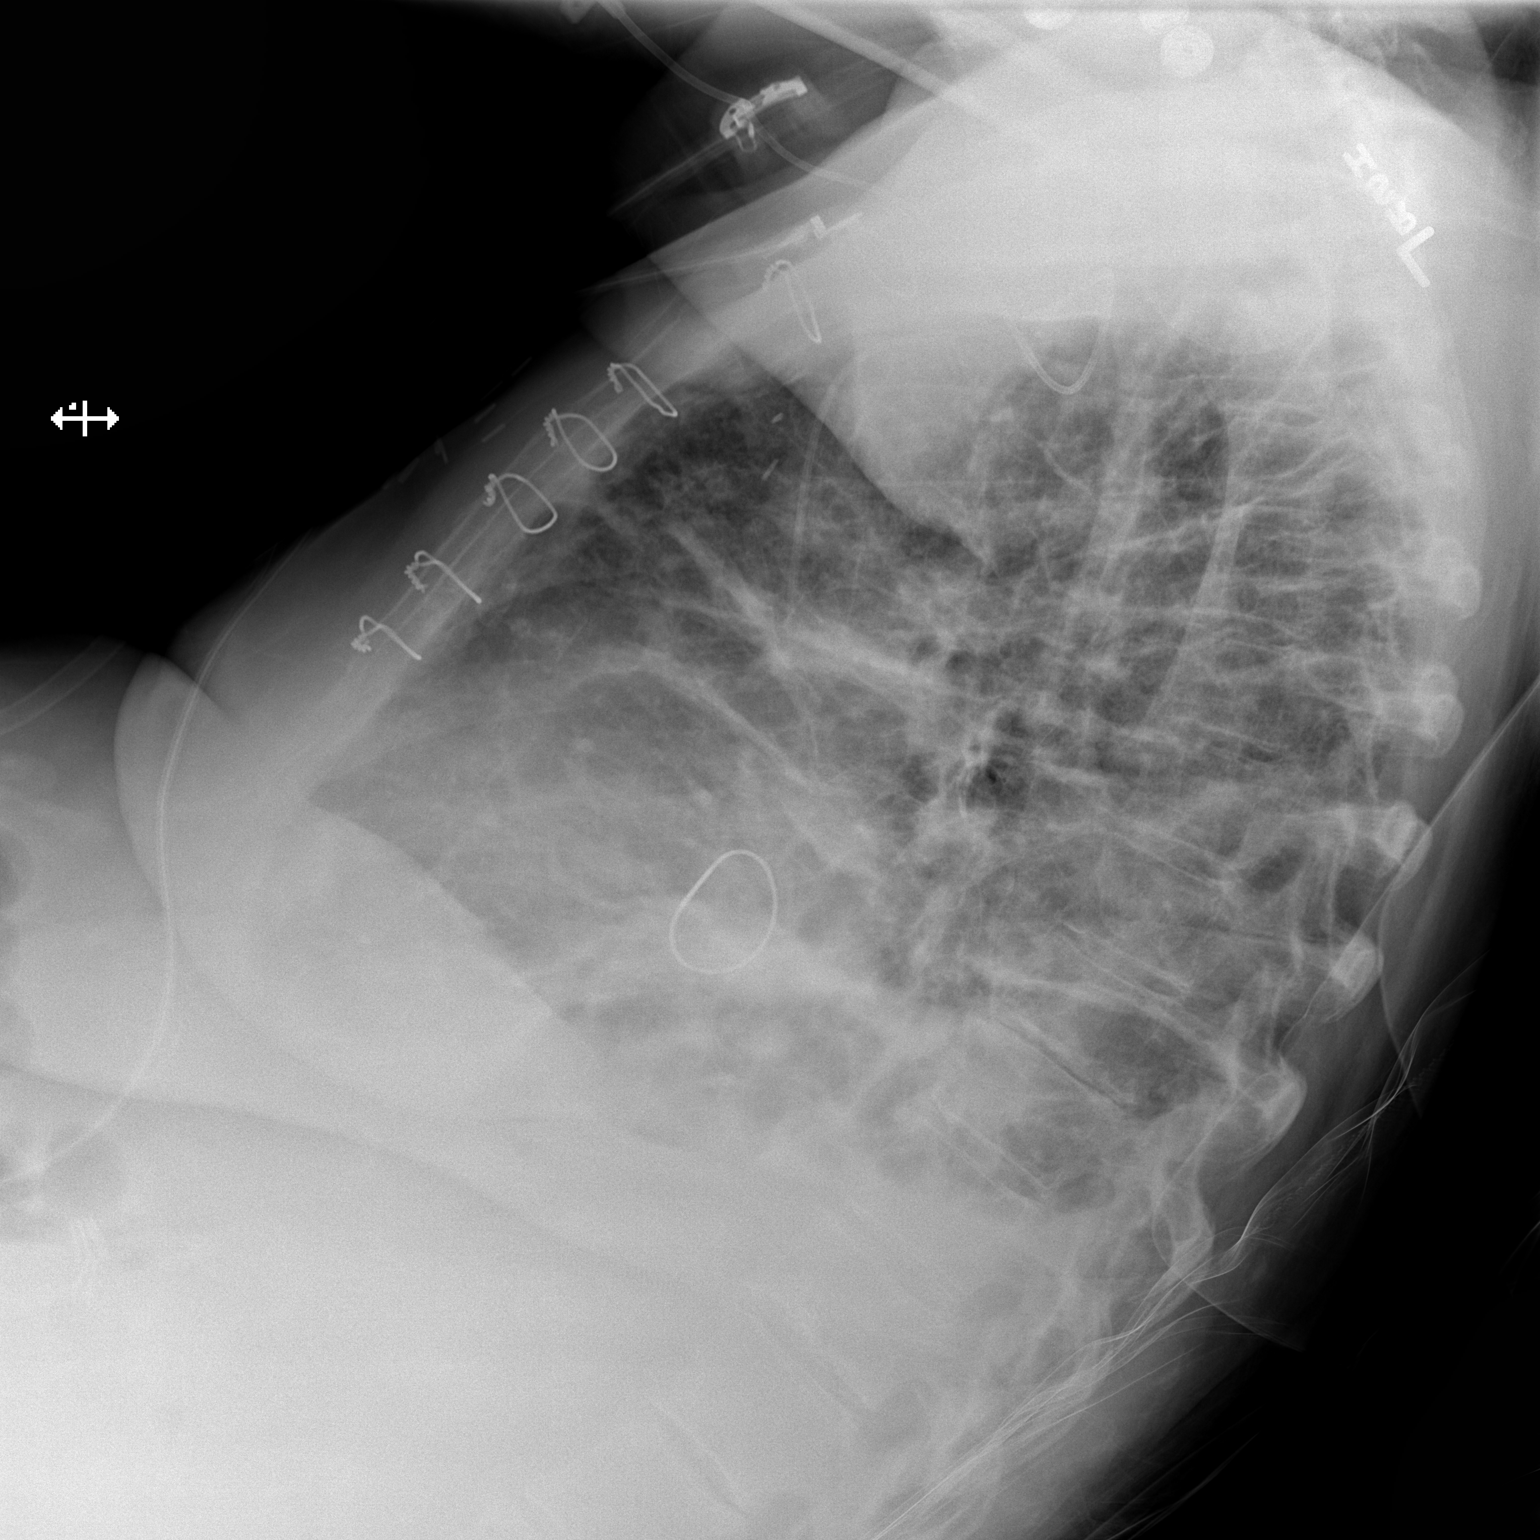

[2 of 2 positions shown; findings below may reference images not displayed]

FINDINGS: Cardiomegaly. Prior valve replacement. Lingular atelectasis.
Airspace opacities increasing in the right upper lobe and right lung
base. Cannot exclude pneumonia. Mild interstitial prominence
throughout the lungs could reflect mild interstitial edema. Small
bilateral effusions.
IMPRESSION: Cardiomegaly, possible mild interstitial edema. Small bilateral
effusions.

Increasing right upper lobe and right basilar airspace opacities,
cannot exclude pneumonia.

## 2021-06-16 ENCOUNTER — Ambulatory Visit (HOSPITAL_COMMUNITY)
Admission: EM | Admit: 2021-06-16 | Discharge: 2021-06-16 | Disposition: A | Discharge status: Home / Self Care | Payer: Medicare Other | Attending: Internal Medicine | Admitting: Internal Medicine

## 2021-06-16 ENCOUNTER — Other Ambulatory Visit: Payer: Self-pay

## 2021-06-16 ENCOUNTER — Encounter (HOSPITAL_COMMUNITY): Payer: Self-pay

## 2021-06-16 DIAGNOSIS — M545 Low back pain, unspecified: Secondary | ICD-10-CM | POA: Diagnosis not present

## 2021-06-16 NOTE — ED Provider Notes (Signed)
[place]    CSN: [id number] Arrival date & time: 06/16/21  3710      History   Chief Complaint Chief Complaint  Patient presents with   Fall    HPI CHYRL ELWELL is a 85 y.o. female comes to the urgent care complaining of left upper, knee and left leg pain.  She then fell a week ago.  It was a mechanical fall.  Following that patient complains of left hip and knee pain.  She denies hitting her head nose no loss of consciousness.  Patient has a history of recurrent falls.  Pain is of moderate severity, achy and aggravated by movement.  No known relieving factors.  No weakness in the legs.  Patient is on blood thinners.  No headaches or dizziness at this time.  No confusion.  Patient ambulates with the help of her walker.  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   Wheezing    Hyperglycemia    Acute bacterial endocarditis    Prosthetic joint infection, subsequent encounter    Group B streptococcal bacteriuria    Group B streptococcal infection    Sepsis due to group B Streptococcus ([place])    Acute on chronic combined systolic and diastolic congestive heart failure ([place])    Septic arthritis ([place]) 06/01/2016   Pyogenic arthritis of left knee joint ([place]) 06/01/2016   Uncontrolled type 2 diabetes mellitus 06/01/2016   CAD (coronary artery disease) 06/01/2016   Hypertension 06/01/2016   UTI (urinary tract infection) 06/01/2016   Hypophosphatemia 06/01/2016   History of breast cancer 06/01/2016   Sepsis ([place]) 06/01/2016   Cellulitis of breast 06/01/2016    Past Surgical History:  Procedure Laterality Date   BREAST LUMPECTOMY Right    CARDIAC SURGERY     cataract surgery     CHOLECYSTECTOMY     EXCISIONAL TOTAL KNEE ARTHROPLASTY  WITH ANTIBIOTIC SPACERS Left 06/08/2016   Procedure: EXCISIONAL TOTAL KNEE ARTHROPLASTY WITH ANTIBIOTIC SPACERS;  Surgeon: Rod Can, MD;  Location: [facility];  Service: Orthopedics;  Laterality: Left;   EYE SURGERY     TEE WITHOUT CARDIOVERSION N/A 06/04/2016   Procedure: TRANSESOPHAGEAL ECHOCARDIOGRAM (TEE);  Surgeon: Lelon Perla, MD;  Location: [facility] ENDOSCOPY;  Service: Cardiovascular;  Laterality: N/A;    OB History     Gravida  2   Para  2   Term  2   Preterm      AB      Living         SAB      IAB      Ectopic      Multiple      Live Births               Home Medications    Prior to Admission medications   Medication Sig Start Date End Date Taking? Authorizing Provider  acetaminophen (TYLENOL) 325 MG tablet Take 1 tablet (325 mg total) by mouth every 8 (eight) hours as needed for mild pain (or Fever >/= 101). 06/11/16   Arrien, Jimmy Picket, MD  anastrozole (ARIMIDEX) 1 MG tablet Take 1 mg by mouth daily.    [provider]  bisacodyl (DULCOLAX) 10 MG suppository Place 1 suppository (  10 mg total) rectally daily as needed for moderate constipation. 06/11/16   Arrien, Jimmy Picket, MD  cefTRIAXone 2 g in dextrose 5 % 50 mL Inject 2 g into the vein daily. 06/11/16   Arrien, Jimmy Picket, MD  docusate sodium (COLACE) 100 MG capsule Take 1 capsule (100 mg total) by mouth 2 (two) times daily. 06/11/16   Arrien, Jimmy Picket, MD  dronedarone (MULTAQ) 400 MG tablet Take 400 mg by mouth 2 (two) times daily with a meal.    [provider]  furosemide (LASIX) 40 MG tablet Take 40 mg by mouth daily.    [provider]  gentamicin (GARAMYCIN) 1.2-0.9 MG/ML-% Inject 50 mLs (60 mg total) into the vein every 12 (twelve) hours. 06/11/16   Arrien, Jimmy Picket, MD  HYDROcodone-acetaminophen (NORCO/VICODIN) 5-325 MG tablet Take 1 tablet by mouth every 6 (six) hours as needed (breakthrough pain). 06/11/16   Arrien, Jimmy Picket, MD   insulin aspart (NOVOLOG) 100 UNIT/ML injection Inject 0-5 Units into the skin at bedtime. 06/11/16   Arrien, Jimmy Picket, MD  insulin glargine (LANTUS) 100 UNIT/ML injection Inject 0.18 mLs (18 Units total) into the skin at bedtime. 06/11/16   Arrien, Jimmy Picket, MD  losartan (COZAAR) 100 MG tablet Take 100 mg by mouth daily.    [provider]  metoprolol succinate (TOPROL-XL) 100 MG 24 hr tablet Take 1 tablet (100 mg total) by mouth daily. Take with or immediately following a meal. 06/11/16   Arrien, Jimmy Picket, MD  Multiple Vitamins-Minerals (PRESERVISION AREDS 2+MULTI VIT PO) Take 2 tablets by mouth daily.    [provider]  oxybutynin (DITROPAN-XL) 10 MG 24 hr tablet Take 1 tablet (10 mg total) by mouth at bedtime. 06/11/16   Arrien, Jimmy Picket, MD  pantoprazole (PROTONIX) 40 MG tablet Take 1 tablet (40 mg total) by mouth daily. 06/11/16   Arrien, Jimmy Picket, MD  polyethylene glycol [facility] / Floria Raveling) packet Take 17 g by mouth daily as needed for mild constipation. 06/11/16   Arrien, Jimmy Picket, MD  potassium chloride (K-DUR) 10 MEQ tablet Take 2 tablets (20 mEq total) by mouth daily. 06/11/16   Arrien, Jimmy Picket, MD  saccharomyces boulardii (FLORASTOR) 250 MG capsule Take 1 capsule (250 mg total) by mouth 2 (two) times daily. 06/11/16   Arrien, Jimmy Picket, MD  WARFARIN SODIUM PO Take 5 mg by mouth daily. Dose from [place], [place], Dr Dierdre Searles at [phone]    [provider]  zaleplon (SONATA) 5 MG capsule Take 5 mg by mouth at bedtime as needed for sleep.    [provider]    Family History Family History  Problem Relation Age of Onset   Heart failure Mother    Asthma Father     Social History Social History   Tobacco Use   Smoking status: Former   Smokeless tobacco: Never  Substance Use Topics   Alcohol use: No   Drug use: No     Allergies   Amoxicillin and Penicillins   Review of  Systems Review of Systems  Musculoskeletal:  Positive for arthralgias and myalgias. Negative for back pain, joint swelling, neck pain and neck stiffness.  Neurological:  Negative for dizziness, weakness, numbness and headaches.    Physical Exam Triage Vital Signs ED Triage Vitals  Enc Vitals Group     BP 06/16/21 1135 (!) 147/49     Pulse Rate 06/16/21 1135 62     Resp 06/16/21 1135 18  Temp 06/16/21 1135 98.3 F (36.8 C)     Temp Source 06/16/21 1135 Oral     SpO2 06/16/21 1135 99 %     Weight --      Height --      Head Circumference --      Peak Flow --      Pain Score 06/16/21 1133 9     Pain Loc --      Pain Edu? --      Excl. in [place]? --    No data found.  Updated Vital Signs BP (!) 144/57   Pulse 62   Temp 98.3 F (36.8 C) (Oral)   Resp 18   SpO2 99%   Visual Acuity Right Eye Distance:   Left Eye Distance:   Bilateral Distance:    Right Eye Near:   Left Eye Near:    Bilateral Near:     Physical Exam Vitals and nursing note reviewed.  Constitutional:      General: She is not in acute distress.    Appearance: She is not ill-appearing.  HENT:     Right Ear: Tympanic membrane normal.     Left Ear: Tympanic membrane normal.  Cardiovascular:     Rate and Rhythm: Normal rate and regular rhythm.     Pulses: Normal pulses.     Heart sounds: Normal heart sounds.  Pulmonary:     Effort: Pulmonary effort is normal.     Breath sounds: Normal breath sounds.  Musculoskeletal:        General: No swelling, tenderness or deformity. Normal range of motion.     Comments: Patient is able to bear weight on the left leg.  Slight bruising on the lateral aspect of the left knee.  Patient has had left knee replacement in the past.  Tenderness over the left sacroiliac joint and ilial crest.  Skin:    General: Skin is warm.     Findings: No bruising or erythema.  Neurological:     Mental Status: She is alert.     UC Treatments / Results  Labs (all labs ordered are  listed, but only abnormal results are displayed) Labs Reviewed - No data to display  EKG   Radiology No results found.  Procedures Procedures (including critical care time)  Medications Ordered in UC Medications - No data to display  Initial Impression / Assessment and Plan / UC Course  I have reviewed the triage vital signs and the nursing notes.  Pertinent labs & imaging results that were available during my care of the patient were reviewed by me and considered in my medical decision making (see chart for details).     1.  Acute left-sided lower back pain without sciatica: Gentle range of motion exercises Heating pad use Continue taking tramadol and alternate with Tylenol No indication for x-rays Return to urgent care if symptoms worsen. Final Clinical Impressions(s) / UC Diagnoses   Final diagnoses:  Acute left-sided low back pain without sciatica     Discharge Instructions      Gentle range of motion exercises Continue taking tramadol and Tylenol as needed for pain Heating pad use on the 20 minutes on-20 minutes off schedule Your physical exam does not indicate fractures Return to urgent care if you have any worsening symptoms.     ED Prescriptions   None    PDMP not reviewed this encounter.   Chase Picket, MD 06/17/21 [phone]

## 2021-06-16 NOTE — ED Triage Notes (Signed)
Pt presents with left knee and leg pain. States she fell on her left side and states her hip hurts. States she has sharp pain when coughing. States the pain is an ache not sharp.  Pt states she is on blood thinners.

## 2021-06-16 NOTE — Discharge Instructions (Signed)
Gentle range of motion exercises Continue taking tramadol and Tylenol as needed for pain Heating pad use on the 20 minutes on-20 minutes off schedule Your physical exam does not indicate fractures Return to urgent care if you have any worsening symptoms.
